# Patient Record
Sex: Male | Born: 1965 | Race: White | Hispanic: No | Marital: Married | Smoking: Former smoker
Health system: Southern US, Community
[De-identification: ages and names within clinical notes are randomized; demographics above are authoritative.]

## PROBLEM LIST (undated history)

## (undated) DIAGNOSIS — A539 Syphilis, unspecified: Secondary | ICD-10-CM

## (undated) DIAGNOSIS — M51369 Other intervertebral disc degeneration, lumbar region without mention of lumbar back pain or lower extremity pain: Secondary | ICD-10-CM

## (undated) DIAGNOSIS — K219 Gastro-esophageal reflux disease without esophagitis: Secondary | ICD-10-CM

## (undated) DIAGNOSIS — B1910 Unspecified viral hepatitis B without hepatic coma: Secondary | ICD-10-CM

## (undated) DIAGNOSIS — F32A Depression, unspecified: Secondary | ICD-10-CM

## (undated) DIAGNOSIS — F329 Major depressive disorder, single episode, unspecified: Secondary | ICD-10-CM

## (undated) DIAGNOSIS — M5136 Other intervertebral disc degeneration, lumbar region: Secondary | ICD-10-CM

## (undated) HISTORY — DX: Gastro-esophageal reflux disease without esophagitis: K21.9

## (undated) HISTORY — DX: Other intervertebral disc degeneration, lumbar region without mention of lumbar back pain or lower extremity pain: M51.369

## (undated) HISTORY — DX: Depression, unspecified: F32.A

## (undated) HISTORY — DX: Major depressive disorder, single episode, unspecified: F32.9

## (undated) HISTORY — DX: Other intervertebral disc degeneration, lumbar region: M51.36

---

## 2010-09-21 ENCOUNTER — Other Ambulatory Visit (INDEPENDENT_AMBULATORY_CARE_PROVIDER_SITE_OTHER): Payer: BC Managed Care – PPO | Admitting: Internal Medicine

## 2010-09-21 ENCOUNTER — Other Ambulatory Visit (INDEPENDENT_AMBULATORY_CARE_PROVIDER_SITE_OTHER): Payer: BC Managed Care – PPO

## 2010-09-21 ENCOUNTER — Ambulatory Visit (INDEPENDENT_AMBULATORY_CARE_PROVIDER_SITE_OTHER): Payer: BC Managed Care – PPO | Admitting: Internal Medicine

## 2010-09-21 ENCOUNTER — Encounter: Payer: Self-pay | Admitting: Internal Medicine

## 2010-09-21 VITALS — BP 118/72 | HR 54 | Temp 97.8°F | Ht 69.0 in | Wt 230.0 lb

## 2010-09-21 DIAGNOSIS — Z0000 Encounter for general adult medical examination without abnormal findings: Secondary | ICD-10-CM | POA: Insufficient documentation

## 2010-09-21 DIAGNOSIS — R5381 Other malaise: Secondary | ICD-10-CM

## 2010-09-21 DIAGNOSIS — R5383 Other fatigue: Secondary | ICD-10-CM

## 2010-09-21 DIAGNOSIS — F329 Major depressive disorder, single episode, unspecified: Secondary | ICD-10-CM

## 2010-09-21 DIAGNOSIS — Z136 Encounter for screening for cardiovascular disorders: Secondary | ICD-10-CM

## 2010-09-21 DIAGNOSIS — F32A Depression, unspecified: Secondary | ICD-10-CM | POA: Insufficient documentation

## 2010-09-21 DIAGNOSIS — E785 Hyperlipidemia, unspecified: Secondary | ICD-10-CM

## 2010-09-21 DIAGNOSIS — Z23 Encounter for immunization: Secondary | ICD-10-CM

## 2010-09-21 LAB — COMPREHENSIVE METABOLIC PANEL
Albumin: 4.1 g/dL (ref 3.5–5.2)
Alkaline Phosphatase: 90 U/L (ref 39–117)
BUN: 13 mg/dL (ref 6–23)
Calcium: 9.1 mg/dL (ref 8.4–10.5)
Chloride: 105 mEq/L (ref 96–112)
Creatinine, Ser: 1 mg/dL (ref 0.4–1.5)
Glucose, Bld: 84 mg/dL (ref 70–99)
Potassium: 4.1 mEq/L (ref 3.5–5.1)

## 2010-09-21 LAB — CBC WITH DIFFERENTIAL/PLATELET
Basophils Absolute: 0 10*3/uL (ref 0.0–0.1)
Eosinophils Absolute: 0.2 10*3/uL (ref 0.0–0.7)
Hemoglobin: 14.9 g/dL (ref 13.0–17.0)
Lymphocytes Relative: 34.8 % (ref 12.0–46.0)
MCHC: 35 g/dL (ref 30.0–36.0)
Monocytes Relative: 9 % (ref 3.0–12.0)
Neutro Abs: 3.7 10*3/uL (ref 1.4–7.7)
Platelets: 165 10*3/uL (ref 150.0–400.0)
RDW: 12.3 % (ref 11.5–14.6)

## 2010-09-21 LAB — TSH: TSH: 2.37 u[IU]/mL (ref 0.35–5.50)

## 2010-09-21 LAB — LIPID PANEL
Cholesterol: 170 mg/dL (ref 0–200)
HDL: 33.7 mg/dL — ABNORMAL LOW (ref >39.00)

## 2010-09-21 LAB — LDL CHOLESTEROL, DIRECT: Direct LDL: 97.3 mg/dL

## 2010-09-21 MED ORDER — BUPROPION HCL ER (XL) 150 MG PO TB24: 150.0000 mg | ORAL_TABLET | Freq: Every day | ORAL | Status: DC

## 2010-09-21 NOTE — Assessment & Plan Note (Signed)
Start bupropion - I think he will feel better with the support of DA and NE, will do labs today to look for secondary causes

## 2010-09-21 NOTE — Progress Notes (Signed)
Subjective:    Patient ID: Brandon Lane, male    DOB: 02/12/1966, 45 y.o.   MRN: [id number]  HPI New to me he complains of chronic depression that has worsened over the last year associated with the death of his mother in 2010/05/05. He has fatigue, weight gain, hypersomnolence, anhedonia, irritability, and sadness.  Also, he wants to do a complete physical today.    Review of Systems  Constitutional: Positive for fatigue and unexpected weight change (weight gain). Negative for fever, chills, diaphoresis, activity change and appetite change.  HENT: Negative for nosebleeds, congestion, facial swelling, rhinorrhea, trouble swallowing, neck pain, neck stiffness and postnasal drip.   Respiratory: Negative for apnea, cough, choking, chest tightness, shortness of breath, wheezing and stridor.   Cardiovascular: Negative for chest pain, palpitations and leg swelling.  Gastrointestinal: Negative for nausea, abdominal pain, diarrhea, constipation, blood in stool, abdominal distention, anal bleeding and rectal pain.  Genitourinary: Negative for dysuria, urgency, frequency, hematuria, flank pain, decreased urine volume, discharge, scrotal swelling, enuresis, difficulty urinating and testicular pain.  Musculoskeletal: Negative for back pain, joint swelling, arthralgias and gait problem.  Skin: Negative for color change, pallor, rash and wound (tattoes).  Neurological: Negative for dizziness, tremors, seizures, syncope, facial asymmetry, speech difficulty, weakness, light-headedness, numbness and headaches.  Hematological: Negative for adenopathy. Does not bruise/bleed easily.  Psychiatric/Behavioral: Positive for dysphoric mood. Negative for suicidal ideas, hallucinations, behavioral problems, confusion, self-injury, decreased concentration and agitation. The patient is not nervous/anxious and is not hyperactive.        Objective:   Physical Exam  Constitutional: He is oriented to person, place, and  time. He appears well-developed and well-nourished. No distress.  HENT:  Head: Normocephalic and atraumatic. No trismus in the jaw.  Right Ear: External ear normal.  Left Ear: External ear normal.  Nose: Nose normal.  Mouth/Throat: Uvula is midline and oropharynx is clear and moist. Mucous membranes are not pale, not dry and not cyanotic. He does not have dentures. No oral lesions. Abnormal dentition (missing teeth and poor dentition). Dental caries present. No dental abscesses, uvula swelling or lacerations. No oropharyngeal exudate, posterior oropharyngeal edema, posterior oropharyngeal erythema or tonsillar abscesses.  Eyes: Conjunctivae and EOM are normal. Pupils are equal, round, and reactive to light. Right eye exhibits no discharge. Left eye exhibits no discharge. No scleral icterus.  Neck: Normal range of motion. Neck supple. No JVD present. No tracheal deviation present. No thyromegaly present.  Cardiovascular: Normal rate, regular rhythm, normal heart sounds and intact distal pulses.  Exam reveals no gallop and no friction rub.   No murmur heard. Pulmonary/Chest: Effort normal and breath sounds normal. No respiratory distress. He has no wheezes. He has no rales. He exhibits no tenderness.  Abdominal: Soft. Bowel sounds are normal. He exhibits no distension and no mass. There is no tenderness. There is no rebound and no guarding. Hernia confirmed negative in the right inguinal area and confirmed negative in the left inguinal area.  Genitourinary: Testes normal and penis normal. Right testis shows no mass, no swelling and no tenderness. Left testis shows no mass, no swelling and no tenderness. Circumcised. No phimosis, hypospadias, penile erythema or penile tenderness. No discharge found.  Musculoskeletal: Normal range of motion. He exhibits no edema and no tenderness.  Lymphadenopathy:    He has no cervical adenopathy.       Right: No inguinal adenopathy present.       Left: No inguinal  adenopathy present.  Neurological: He is alert and  oriented to person, place, and time. He has normal reflexes. No cranial nerve deficit. Coordination normal.  Skin: Skin is warm and dry. No rash noted. He is not diaphoretic. No erythema.  Psychiatric: He has a normal mood and affect. His behavior is normal. Judgment and thought content normal.          Assessment & Plan:

## 2010-09-21 NOTE — Assessment & Plan Note (Addendum)
Routine labs ordered, his EKG is normal

## 2010-09-21 NOTE — Patient Instructions (Signed)
Health Maintenance in Males MAINTAIN REGULAR HEALTH EXAMS  Maintain a healthy diet and normal weight. Increased weight leads to problems with blood pressure and diabetes. Decrease fat in the diet and increase exercise. Obtain a proper diet from your caregiver if necessary.   High blood pressure causes heart and blood vessel problems. Check blood pressures regularly and keep your blood pressure at normal limits. Aerobic exercise helps this. Persistent elevations of blood pressure should be treated with medications if weight loss and exercise are ineffective.   Avoid smoking, drinking in excess (more than 2 drinks per day), or use of street drugs. Do not share needles with anyone. Ask for help if you need assistance or instructions on stopping the use of alcohol, cigarettes, or drugs.   Maintain normal blood lipids and cholesterol. Your caregiver can give you information to lower your risk of heart disease or stroke.   Ask your caregiver if you are in need of early heart disease screening because of a strong family history of heart disease or signs of elevated testosterone (male sex hormone) levels. These can predispose you to early heart disease.   Practice safe sex. Practicing safe sex decreases your risk for a sexually transmitted infection (STI). Some of the STIs are gonorrhea, chlamydia, syphilis, trichimonas, herpes, human papillomavirus (HPV), and human immunodeficiency virus (HIV). Herpes, HIV, and HPV are viral illnesses that have no cure. These can result in disability, cancer, and death.   It is not safe for someone who has AIDS or is HIV positive to have unprotected sex with a partner who is HIV positive. The reason for this is the fact that there are many different strains of HIV. If you have a strain that is readily treated with medications and then suddenly introduce a strain from a partner that has no further treatment options, you may suddenly have a strain of HIV that is untreatable.  Even if you are both positive for HIV, it is still necessary to practice safe sex.   Use sunscreen with a SPF of 15 or greater. Being outside in the sun when your shadow caused by the sun is shorter than you are, means you are being exposed to sun at greater intensity. Lighter skinned people are at a greater risk of skin cancer.   Keep carbon monoxide and smoke detectors in your home and functioning at all times. Change the batteries every 6 months.   Do monthly examinations of your testicles. The best time to do this is after a hot shower or bath when the tissues are loose. Notify your caregivers of any lumps, tenderness, or changes in size or shape.   Notify your caregiver of new moles or changes in moles, especially if there is a change in shape or color. Also notify your caregiver if a mole is larger than the size of a pencil eraser.   Stay current with your tetanus shots and other required immunizations.  The Body Mass Index (BMI) is a way of measuring how much of your body is fat. Having a BMI above 27 increases the risk of heart disease, diabetes, hypertension, stroke, and other problems related to obesity. Document Released: 11/18/2007 Document Re-Released: 11/09/2009 [facility] Patient Information 2011 [place], [place].Depression, Adolescent and Adult Depression is a true and treatable medical condition. In general there are two kinds of depression:  Depression we all experience in some form. For example depression from the death of a loved one, financial distress or natural disasters will trigger or increase depression.  Clinical depression, on the other hand, appears without an apparent cause or reason. This depression is a disease. Depression may be caused by chemical imbalance in the body and brain or may come as a response to a physical illness. Alcohol and other drugs can cause depression.  DIAGNOSIS  The diagnosis of depression is usually based upon symptoms and medical  history. TREATMENT Treatments for depression fall into three categories. These are:  Drug therapy. There are many medicines that treat depression. Responses may vary and sometimes trial and error is necessary to determine the best medicines and dosage for a particular patient.   Psychotherapy, also called talking treatments, helps people resolve their problems by looking at them from a different point of view and by giving people insight into their own personal makeup. Traditional psychotherapy looks at a childhood source of a problem. Other psychotherapy will look at current conflicts and move toward solving those. If the cause of depression is drug use, counseling is available to help abstain. In time the depression will usually improve. If there were underlying causes for the chemical use, they can be addressed.   ECT (electroconvulsive therapy) or shock treatment is not as commonly used today. It is a very effective treatment for severe suicidal depression. During ECT electrical impulses are applied to the head. These impulses cause a generalized seizure. It can be effective but causes a loss of memory for recent events. Sometimes this loss of memory may include the last several months.  Treat all depression or suicide threats as serious. Obtain professional help. Do not wait to see if serious depression will get better over time without help. Seek help for yourself or those around you. In the [place] the number to the National Suicide Help Lines With 24 Hour Help Are: 1-800-SUICIDE 919-593-6129 Document Released: 05/19/2000 Document Re-Released: 08/18/2008 [facility] Patient Information 2011 [place], [place].

## 2010-09-21 NOTE — Assessment & Plan Note (Signed)
Check labs for secondary causes

## 2010-09-21 NOTE — Assessment & Plan Note (Signed)
Start bupropion

## 2010-09-22 ENCOUNTER — Encounter: Payer: Self-pay | Admitting: Internal Medicine

## 2010-10-24 ENCOUNTER — Encounter: Payer: Self-pay | Admitting: Internal Medicine

## 2010-10-24 ENCOUNTER — Ambulatory Visit (INDEPENDENT_AMBULATORY_CARE_PROVIDER_SITE_OTHER): Payer: BC Managed Care – PPO | Admitting: Internal Medicine

## 2010-10-24 VITALS — BP 118/76 | HR 58 | Temp 98.7°F | Resp 16 | Wt 230.0 lb

## 2010-10-24 DIAGNOSIS — E781 Pure hyperglyceridemia: Secondary | ICD-10-CM | POA: Insufficient documentation

## 2010-10-24 DIAGNOSIS — F5221 Male erectile disorder: Secondary | ICD-10-CM | POA: Insufficient documentation

## 2010-10-24 DIAGNOSIS — F329 Major depressive disorder, single episode, unspecified: Secondary | ICD-10-CM

## 2010-10-24 DIAGNOSIS — F528 Other sexual dysfunction not due to a substance or known physiological condition: Secondary | ICD-10-CM

## 2010-10-24 MED ORDER — FENOFIBRATE 145 MG PO TABS: 145.0000 mg | ORAL_TABLET | Freq: Every day | ORAL | Status: DC

## 2010-10-24 MED ORDER — BUPROPION HCL ER (XL) 300 MG PO TB24: 300.0000 mg | ORAL_TABLET | Freq: Every day | ORAL | Status: DC

## 2010-10-24 MED ORDER — SILDENAFIL CITRATE 100 MG PO TABS: 100.0000 mg | ORAL_TABLET | ORAL | Status: DC | PRN

## 2010-10-24 NOTE — Assessment & Plan Note (Signed)
After a discussion he decided to start a med as he is not very optimistic about his ability to lower his trigs with diet, exercise, weight loss. So he was started on Tricor.

## 2010-10-24 NOTE — Progress Notes (Signed)
Subjective:    Patient ID: Brandon Lane, male    DOB: 25-Mar-1966, 45 y.o.   MRN: [id number]  Hyperlipidemia This is a new problem. The current episode started more than 1 month ago. The problem is uncontrolled. Recent lipid tests were reviewed and are high. Exacerbating diseases include obesity. He has no history of chronic renal disease, diabetes, hypothyroidism, liver disease or nephrotic syndrome. Factors aggravating his hyperlipidemia include fatty foods. Pertinent negatives include no chest pain, focal sensory loss, focal weakness, leg pain, myalgias or shortness of breath. He is currently on no antihyperlipidemic treatment. Compliance problems include adherence to diet and adherence to exercise.     He tells me that his mood is much better on wellbutrin and his irritability has diminished a lot, he would like to increase the dose if possible, he thinks the wellbutrin has affected his erectile function b/c he has had some loss of erections in the midst of sex. He has not had a decrease in his libido.   Review of Systems  Constitutional: Negative for fever, chills, diaphoresis, activity change, appetite change, fatigue and unexpected weight change.  Respiratory: Negative for apnea, cough, choking, chest tightness, shortness of breath, wheezing and stridor.   Cardiovascular: Negative for chest pain, palpitations and leg swelling.  Gastrointestinal: Negative for nausea, vomiting, abdominal pain, diarrhea, abdominal distention and anal bleeding.  Genitourinary: Negative for dysuria, urgency, frequency, hematuria, flank pain, decreased urine volume, scrotal swelling, enuresis, difficulty urinating and testicular pain.  Musculoskeletal: Negative for myalgias, back pain, joint swelling, arthralgias and gait problem.  Skin: Negative for color change, pallor and rash.  Neurological: Negative for dizziness, tremors, focal weakness, seizures, syncope, facial asymmetry, speech difficulty, weakness,  light-headedness, numbness and headaches.  Hematological: Negative for adenopathy. Does not bruise/bleed easily.  Psychiatric/Behavioral: Negative for suicidal ideas, hallucinations, behavioral problems, confusion, sleep disturbance, self-injury, dysphoric mood, decreased concentration and agitation. The patient is not nervous/anxious and is not hyperactive.        Objective:   Physical Exam  Vitals reviewed. Constitutional: He is oriented to person, place, and time. He appears well-developed and well-nourished. No distress.  HENT:  Head: Normocephalic and atraumatic.  Right Ear: External ear normal.  Left Ear: External ear normal.  Nose: Nose normal.  Mouth/Throat: No oropharyngeal exudate.  Eyes: Conjunctivae and EOM are normal. Pupils are equal, round, and reactive to light. Right eye exhibits no discharge. Left eye exhibits no discharge. No scleral icterus.  Neck: Normal range of motion. Neck supple. No JVD present. No tracheal deviation present. No thyromegaly present.  Cardiovascular: Normal rate, regular rhythm, normal heart sounds and intact distal pulses.  Exam reveals no gallop and no friction rub.   No murmur heard. Pulmonary/Chest: Effort normal and breath sounds normal. No stridor. No respiratory distress. He has no wheezes. He has no rales. He exhibits no tenderness.  Abdominal: Soft. Bowel sounds are normal. He exhibits no distension and no mass. There is no tenderness. There is no rebound and no guarding.  Genitourinary: Rectum normal, prostate normal and penis normal.  Musculoskeletal: Normal range of motion. He exhibits no edema and no tenderness.  Lymphadenopathy:    He has no cervical adenopathy.  Neurological: He is alert and oriented to person, place, and time. He has normal reflexes. No cranial nerve deficit. Coordination normal.  Skin: Skin is warm and dry. No rash noted. He is not diaphoretic. No erythema. No pallor.  Psychiatric: He has a normal mood and affect.  His behavior is normal. Judgment  and thought content normal.        Lab Results  Component Value Date   WBC 7.1 09/21/2010   HGB 14.9 09/21/2010   HCT 42.6 09/21/2010   PLT 165.0 09/21/2010   CHOL 170 09/21/2010   TRIG 292.0* 09/21/2010   HDL 33.70* 09/21/2010   LDLDIRECT 97.3 09/21/2010   ALT 25 09/21/2010   AST 27 09/21/2010   NA 141 09/21/2010   K 4.1 09/21/2010   CL 105 09/21/2010   CREATININE 1.0 09/21/2010   BUN 13 09/21/2010   CO2 29 09/21/2010   TSH 2.37 09/21/2010    Assessment & Plan:

## 2010-10-24 NOTE — Assessment & Plan Note (Signed)
Will increase his dose of wellbutrin

## 2010-10-24 NOTE — Patient Instructions (Signed)
Hypertriglyceridemia    Diet for High blood levels of Triglycerides  Most fats in food are triglycerides. Triglycerides in your blood are stored as fat in your body. High levels of triglycerides in your blood may put you at a greater risk for heart disease and stroke.    Normal triglyceride levels are less than 150 mg/dL. Borderline high levels are 150-199 mg/dl. High levels are 200 - 499 mg/dL, and very high triglyceride levels are greater than 500 mg/dL. The decision to treat high triglycerides is generally based on the level. For people with borderline or high triglyceride levels, treatment includes weight loss and exercise. Drugs are recommended for people with very high triglyceride levels.  Many people who need treatment for high triglyceride levels have metabolic syndrome. This syndrome is a collection of disorders that often include: insulin resistance, high blood pressure, blood clotting problems, high cholesterol and triglycerides.  TESTING PROCEDURE FOR TRIGLYCERIDES   You should not eat 4 hours before getting your triglycerides measured. The normal range of triglycerides is between 10 and 250 milligrams per deciliter (mg/dl). Some people may have extreme levels (1000 or above), but your triglyceride level may be too high if it is above 150 mg/dl, depending on what other risk factors you have for heart disease.    People with high blood triglycerides may also have high blood cholesterol levels. If you have high blood cholesterol as well as high blood triglycerides, your risk for heart disease is probably greater than if you only had high triglycerides. High blood cholesterol is one of the main risk factors for heart disease.   CHANGING YOUR DIET     Your weight can affect your blood triglyceride level. If you are more than 20% above your ideal body weight, you may be able to lower your blood triglycerides by losing weight. Eating less and exercising regularly is the best way to combat this. Fat provides more calories than any other food. The best way to lose weight is to eat less fat. Only 30% of your total calories should come from fat. Less than 7% of your diet should come from saturated fat. A diet low in fat and saturated fat is the same as a diet to decrease blood cholesterol. By eating a diet lower in fat, you may lose weight, lower your blood cholesterol, and lower your blood triglyceride level.    Eating a diet low in fat, especially saturated fat, may also help you lower your blood triglyceride level. Ask your dietitian to help you figure how much fat you can eat based on the number of calories your caregiver has prescribed for you.    Exercise, in addition to helping with weight loss may also help lower triglyceride levels.    Alcohol can increase blood triglycerides. You may need to stop drinking alcoholic beverages.    Too much carbohydrate in your diet may also increase your blood triglycerides. Some complex carbohydrates are necessary in your diet. These may include bread, rice, potatoes, other starchy vegetables and cereals.    Reduce "simple" carbohydrates. These may include pure sugars, candy, honey, and jelly without losing other nutrients. If you have the kind of high blood triglycerides that is affected by the amount of carbohydrates in your diet, you will need to eat less sugar and less high-sugar foods. Your caregiver can help you with this.    Adding 2-4 grams of fish oil (EPA+ DHA) may also help lower triglycerides. Speak with your caregiver before adding any supplements to   your regimen.   Following the Diet    Maintain your ideal weight. Your caregivers can help you with a diet. Generally, eating less food and getting more exercise will help you lose weight. Joining a weight control group may also help. Ask your caregivers for a good weight control group in your area.    Eat low-fat foods instead of high-fat foods. This can help you lose weight too.    These foods are lower in fat. Eat MORE of these:    Dried beans, peas, and lentils.      Egg whites.      Low-fat cottage cheese.      Fish.     Lean cuts of meat, such as round, sirloin, rump, and flank (cut extra fat off meat you fix).     Whole grain breads, cereals and pasta.      Skim and nonfat dry milk.      Low-fat yogurt.      Poultry without the skin.      Cheese made with skim or part-skim milk, such as mozzarella, parmesan, farmers', ricotta, or pot cheese.      These are higher fat foods. Eat LESS of these:    Whole milk and foods made from whole milk, such as American, blue, cheddar, monterey jack, and swiss cheese    High-fat meats, such as luncheon meats, sausages, knockwurst, bratwurst, hot dogs, ribs, corned beef, ground pork, and regular ground beef.    Fried foods.   Limit saturated fats in your diet. Substituting unsaturated fat for saturated fat may decrease your blood triglyceride level. You will need to read package labels to know which products contain saturated fats.    These foods are high in saturated fat. Eat LESS of these:    Fried pork skins.    Whole milk.      Skin and fat from poultry.      Palm oil.      Butter.     Shortening.     Cream cheese.      Bacon.     Margarines and baked goods made from listed oils.      Vegetable shortenings.     Chitterlings.    Fat from meats.      Coconut oil.      Palm kernel oil.      Lard.     Cream.     Sour cream.      Fatback.     Coffee whiteners and non-dairy creamers made with these oils.      Cheese made from whole milk.       Use unsaturated fats (both polyunsaturated and monounsaturated) moderately. Remember, even though unsaturated fats are better than saturated fats; you still want a diet low in total fat.    These foods are high in unsaturated fat:    Canola oil.    Sunflower oil.      Mayonnaise.     Almonds.     Peanuts.     Pine nuts.      Margarines made with these oils.     Safflower oil.    Olive oil.      Avocados.     Cashews.     Peanut butter.      Sunflower seeds.     Soybean oil.    Peanut oil.      Olives.     Pecans.     Walnuts.     Pumpkin seeds.        Avoid sugar and other high-sugar foods. This will decrease carbohydrates without decreasing other nutrients. Sugar in your food goes rapidly to your blood. When there is excess sugar in your blood, your liver may use it to make more triglycerides. Sugar also contains calories without other important nutrients.    Eat LESS of these:    Sugar, brown sugar, powdered sugar, jam, jelly, preserves, honey, syrup, molasses, pies, candy, cakes, cookies, frosting, pastries, colas, soft drinks, punches, fruit drinks, and regular gelatin.    Avoid alcohol. Alcohol, even more than sugar, may increase blood triglycerides. In addition, alcohol is high in calories and low in nutrients. Ask for sparkling water, or a diet soft drink instead of an alcoholic beverage.   Suggestions for planning and preparing meals    Bake, broil, grill or roast meats instead of frying.    Remove fat from meats and skin from poultry before cooking.    Add spices, herbs, lemon juice or vinegar to vegetables instead of salt, rich sauces or gravies.    Use a non-stick skillet without fat or use no-stick sprays.    Cool and refrigerate stews and broth. Then remove the hardened fat floating on the surface before serving.    Refrigerate meat drippings and skim off fat to make low-fat gravies.    Serve more fish.     Use less butter, margarine and other high-fat spreads on bread or vegetables.    Use skim or reconstituted non-fat dry milk for cooking.    Cook with low-fat cheeses.    Substitute low-fat yogurt or cottage cheese for all or part of the sour cream in recipes for sauces, dips or congealed salads.    Use half yogurt/half mayonnaise in salad recipes.    Substitute evaporated skim milk for cream. Evaporated skim milk or reconstituted non-fat dry milk can be whipped and substituted for whipped cream in certain recipes.    Choose fresh fruits for dessert instead of high-fat foods such as pies or cakes. Fruits are naturally low in fat.   When Dining Out    Order low-fat appetizers such as fruit or vegetable juice, pasta with vegetables or tomato sauce.    Select clear, rather than cream soups.    Ask that dressings and gravies be served on the side. Then use less of them.    Order foods that are baked, broiled, poached, steamed, stir-fried, or roasted.    Ask for margarine instead of butter, and use only a small amount.    Drink sparkling water, unsweetened tea or coffee, or diet soft drinks instead of alcohol or other sweet beverages.   QUESTIONS AND ANSWERS ABOUT OTHER FATS IN THE BLOOD:   SATURATED FAT, TRANS FAT, AND CHOLESTEROL  What is trans fat?  Trans fat is a type of fat that is formed when vegetable oil is hardened through a process called hydrogenation. This process helps makes foods more solid, gives them shape, and prolongs their shelf life. Trans fats are also called hydrogenated or partially hydrogenated oils.    What do saturated fat, trans fat, and cholesterol in foods have to do with heart disease?  Saturated fat, trans fat, and cholesterol in the diet all raise the level of LDL "bad" cholesterol in the blood. The higher the LDL cholesterol, the greater the risk for coronary heart disease (CHD). Saturated fat and trans fat raise LDL similarly.     What foods contain saturated fat, trans fat, and cholesterol?  High amounts of saturated fat   are found in animal products, such as fatty cuts of meat, chicken skin, and full-fat dairy products like butter, whole milk, cream, and cheese, and in tropical vegetable oils such as palm, palm kernel, and coconut oil. Trans fat is found in some of the same foods as saturated fat, such as vegetable shortening, some margarines (especially hard or stick margarine), crackers, cookies, baked goods, fried foods, salad dressings, and other processed foods made with partially hydrogenated vegetable oils. Small amounts of trans fat also occur naturally in some animal products, such as milk products, beef, and lamb. Foods high in cholesterol include liver, other organ meats, egg yolks, shrimp, and full-fat dairy products.  How can I use the new food label to make heart-healthy food choices?  Check the Nutrition Facts panel of the food label. Choose foods lower in saturated fat, trans fat, and cholesterol. For saturated fat and cholesterol, you can also use the Percent Daily Value (%DV): 5% DV or less is low, and 20% DV or more is high. (There is no %DV for trans fat.) Use the Nutrition Facts panel to choose foods low in saturated fat and cholesterol, and if the trans fat is not listed, read the ingredients and limit products that list shortening or hydrogenated or partially hydrogenated vegetable oil, which tend to be high in trans fat.  POINTS TO REMEMBER: YOU NEED A LITTLE TLC (THERAPEUTIC LIFESTYLE CHANGES)   Discuss your risk for heart disease with your caregivers, and take steps to reduce risk factors.    Change your diet. Choose foods that are low in saturated fat, trans fat, and cholesterol.     Add exercise to your daily routine if it is not already being done. Participate in physical activity of moderate intensity, like brisk walking, for at least 30 minutes on most, and preferably all days of the week. No time? Break the 30 minutes into three, 10-minute segments during the day.    Stop smoking. If you do smoke, contact your caregiver to discuss ways in which they can help you quit.    Do not use street drugs.    Maintain a normal weight.    Maintain a healthy blood pressure.    Keep up with your blood work for checking the fats in your blood as directed by your caregiver.   Document Released: 03/09/2004 Document Re-Released: 11/09/2009  [employer] Patient Information 2011 [employer].

## 2010-11-04 DIAGNOSIS — Z0000 Encounter for general adult medical examination without abnormal findings: Secondary | ICD-10-CM

## 2010-12-16 ENCOUNTER — Encounter: Payer: Self-pay | Admitting: Internal Medicine

## 2010-12-16 ENCOUNTER — Ambulatory Visit (INDEPENDENT_AMBULATORY_CARE_PROVIDER_SITE_OTHER): Payer: BC Managed Care – PPO | Admitting: Internal Medicine

## 2010-12-16 ENCOUNTER — Ambulatory Visit (INDEPENDENT_AMBULATORY_CARE_PROVIDER_SITE_OTHER)
Admission: RE | Admit: 2010-12-16 | Discharge: 2010-12-16 | Disposition: A | Discharge status: Home / Self Care | Payer: BC Managed Care – PPO | Source: Ambulatory Visit | Attending: Internal Medicine | Admitting: Internal Medicine

## 2010-12-16 DIAGNOSIS — F329 Major depressive disorder, single episode, unspecified: Secondary | ICD-10-CM

## 2010-12-16 DIAGNOSIS — F3289 Other specified depressive episodes: Secondary | ICD-10-CM

## 2010-12-16 DIAGNOSIS — M5137 Other intervertebral disc degeneration, lumbosacral region: Secondary | ICD-10-CM

## 2010-12-16 DIAGNOSIS — M545 Low back pain, unspecified: Secondary | ICD-10-CM

## 2010-12-16 DIAGNOSIS — M51379 Other intervertebral disc degeneration, lumbosacral region without mention of lumbar back pain or lower extremity pain: Secondary | ICD-10-CM

## 2010-12-16 DIAGNOSIS — M5136 Other intervertebral disc degeneration, lumbar region: Secondary | ICD-10-CM

## 2010-12-16 MED ORDER — DULOXETINE HCL 30 MG PO CPEP: 30.0000 mg | ORAL_CAPSULE | Freq: Every day | ORAL | Status: DC

## 2010-12-16 MED ORDER — TAPENTADOL HCL 50 MG PO TABS: 50.0000 mg | ORAL_TABLET | Freq: Four times a day (QID) | ORAL | Status: DC | PRN

## 2010-12-16 NOTE — Assessment & Plan Note (Signed)
I will change to cymbalta, this may help with his low back pain as well

## 2010-12-16 NOTE — Progress Notes (Signed)
Subjective:    Patient ID: Brandon Lane, male    DOB: [birth year], 45 y.o.   MRN: [id number]  Back Pain This is a chronic problem. The current episode started more than 1 year ago. The problem occurs intermittently. The problem has been gradually worsening since onset. The pain is present in the lumbar spine. The quality of the pain is described as aching. The pain does not radiate. The pain is at a severity of 5/10. The pain is moderate. The pain is worse during the day. The symptoms are aggravated by bending. Stiffness is present all day. Pertinent negatives include no abdominal pain, bladder incontinence, bowel incontinence, chest pain, dysuria, fever, headaches, leg pain, numbness, paresis, paresthesias, pelvic pain, perianal numbness, tingling, weakness or weight loss. Risk factors include poor posture and lack of exercise. He has tried NSAIDs and analgesics (he has been taking his wife's percocet) for the symptoms. The treatment provided moderate relief.      Review of Systems  Constitutional: Negative for fever, chills, weight loss, diaphoresis, activity change, appetite change, fatigue and unexpected weight change.  Eyes: Negative.   Respiratory: Negative for apnea, cough, choking, chest tightness, shortness of breath, wheezing and stridor.   Cardiovascular: Negative for chest pain, palpitations and leg swelling.  Gastrointestinal: Negative for nausea, vomiting, abdominal pain, diarrhea, constipation, blood in stool and bowel incontinence.  Genitourinary: Negative.  Negative for bladder incontinence, dysuria and pelvic pain.  Musculoskeletal: Positive for back pain. Negative for myalgias, joint swelling, arthralgias and gait problem.  Skin: Negative for color change, pallor, rash and wound.  Neurological: Negative for dizziness, tingling, tremors, seizures, syncope, facial asymmetry, speech difficulty, weakness, light-headedness, numbness, headaches and paresthesias.    Psychiatric/Behavioral: Positive for behavioral problems (he still feels irritable and wants to try a different antidepressant) and dysphoric mood. Negative for suicidal ideas, hallucinations, confusion, sleep disturbance, self-injury, decreased concentration and agitation. The patient is not nervous/anxious and is not hyperactive.        Objective:   Physical Exam  Vitals reviewed. Constitutional: He is oriented to person, place, and time. He appears well-developed and well-nourished. No distress.  HENT:  Head: Normocephalic and atraumatic.  Right Ear: External ear normal.  Left Ear: External ear normal.  Nose: Nose normal.  Mouth/Throat: Oropharynx is clear and moist. No oropharyngeal exudate.  Eyes: Conjunctivae and EOM are normal. Pupils are equal, round, and reactive to light. Right eye exhibits no discharge. Left eye exhibits no discharge. No scleral icterus.  Neck: Normal range of motion. Neck supple. No JVD present. No tracheal deviation present. No thyromegaly present.  Cardiovascular: Normal rate, regular rhythm, normal heart sounds and intact distal pulses.  Exam reveals no gallop and no friction rub.   No murmur heard. Pulmonary/Chest: Effort normal and breath sounds normal. No stridor. No respiratory distress. He has no wheezes. He has no rales. He exhibits no tenderness.  Abdominal: Soft. Bowel sounds are normal. He exhibits no distension and no mass. There is no tenderness. There is no rebound and no guarding.  Musculoskeletal: Normal range of motion. He exhibits no edema and no tenderness.       Lumbar back: Normal. He exhibits normal range of motion, no tenderness, no bony tenderness, no swelling, no edema, no deformity, no laceration, no pain, no spasm and normal pulse.  Lymphadenopathy:    He has no cervical adenopathy.  Neurological: He is alert and oriented to person, place, and time. He has normal strength. He displays no atrophy and no tremor. No  cranial nerve deficit  or sensory deficit. He exhibits normal muscle tone. He displays a negative Romberg sign. He displays no seizure activity. Coordination and gait normal.  Reflex Scores:      Tricep reflexes are 1+ on the right side and 1+ on the left side.      Bicep reflexes are 1+ on the right side and 1+ on the left side.      Brachioradialis reflexes are 1+ on the right side and 1+ on the left side.      Patellar reflexes are 2+ on the right side and 2+ on the left side.      Achilles reflexes are 2+ on the right side and 2+ on the left side.      - SLR in both legs  Skin: Skin is warm and dry. No rash noted. He is not diaphoretic. No erythema. No pallor.  Psychiatric: He has a normal mood and affect. His behavior is normal. Judgment and thought content normal.          Assessment & Plan:

## 2010-12-16 NOTE — Patient Instructions (Signed)
Back Pain (Lumbosacral Strain) Back pain is one of the most common causes of pain. There are many causes of back pain. Most are not serious conditions.  CAUSES Your backbone (spinal column) is made up of 24 main vertebral bodies, the sacrum, and the coccyx. These are held together by muscles and tough, fibrous tissue (ligaments). Nerve roots pass through the openings between the vertebrae. A sudden move or injury to the back may cause injury to, or pressure on, these nerves. This may result in localized back pain or pain movement (radiation) into the buttocks, down the leg, and into the foot. Sharp, shooting pain from the buttock down the back of the leg (sciatica) is frequently associated with a ruptured (herniated) disc. Pain may be caused by muscle spasm alone. Your caregiver can often find the cause of your pain by the details of your symptoms and an exam. In some cases, you may need tests (such as X-rays). Your caregiver will work with you to decide if any tests are needed based on your specific exam. HOME CARE INSTRUCTIONS  Avoid an underactive lifestyle. Active exercise, as directed by your caregiver, is your greatest weapon against back pain.   Avoid hard physical activities (tennis, racquetball, water-skiing) if you are not in proper physical condition for it. This may aggravate and/or create problems.   If you have a back problem, avoid sports requiring sudden body movements. Swimming and walking are generally safer activities.   Maintain good posture.   Avoid becoming overweight (obese).   Use bed rest for only the most extreme, sudden (acute) episode. Your caregiver will help you determine how much bed rest is necessary.   For acute conditions, you may put ice on the injured area.   Put ice in a plastic bag.   Place a towel between your skin and the bag.   Leave the ice on for 20 minutes at a time, every 2 hours, or as needed.   After you are improved and more active, it may  help to apply heat for 30 minutes before activities.  See your caregiver if you are having pain that lasts longer than expected. Your caregiver can advise appropriate exercises and/or therapy if needed. With conditioning, most back problems can be avoided. SEEK IMMEDIATE MEDICAL CARE IF:  You have numbness, tingling, weakness, or problems with the use of your arms or legs.   You experience severe back pain not relieved with medicines.   There is a change in bowel or bladder control.   You have increasing pain in any area of the body, including your belly (abdomen).   You notice shortness of breath, dizziness, or feel faint.   You feel sick to your stomach (nauseous), are throwing up (vomiting), or become sweaty.   You notice discoloration of your toes or legs, or your feet get very cold.   Your back pain is getting worse.  You have an oral temperature above 100.5Depression, Adolescent and Adult Depression is a true and treatable medical condition. In general there are two kinds of depression: Depression we all experience in some form. For example depression from the death of a loved one, financial distress or natural disasters will trigger or increase depression.  Clinical depression, on the other hand, appears without an apparent cause or reason. This depression is a disease. Depression may be caused by chemical imbalance in the body and brain or may come as a response to a physical illness. Alcohol and other drugs can cause depression.  DIAGNOSIS  The diagnosis of depression is usually based upon symptoms and medical history. TREATMENT Treatments for depression fall into three categories. These are: Drug therapy. There are many medicines that treat depression. Responses may vary and sometimes trial and error is necessary to determine the best medicines and dosage for a particular patient.  Psychotherapy, also called talking treatments, helps people resolve their problems by looking at  them from a different point of view and by giving people insight into their own personal makeup. Traditional psychotherapy looks at a childhood source of a problem. Other psychotherapy will look at current conflicts and move toward solving those. If the cause of depression is drug use, counseling is available to help abstain. In time the depression will usually improve. If there were underlying causes for the chemical use, they can be addressed.  ECT (electroconvulsive therapy) or shock treatment is not as commonly used today. It is a very effective treatment for severe suicidal depression. During ECT electrical impulses are applied to the head. These impulses cause a generalized seizure. It can be effective but causes a loss of memory for recent events. Sometimes this loss of memory may include the last several months.  Treat all depression or suicide threats as serious. Obtain professional help. Do not wait to see if serious depression will get better over time without help. Seek help for yourself or those around you. In the [place] the number to the National Suicide Help Lines With 24 Hour Help Are: 1-800-SUICIDE 364 522 8301 Document Released: 05/19/2000 Document Re-Released: 08/18/2008  [facility] Patient Information 2011 [place], [place]., not controlled by medicine.  MAKE SURE YOU:   Understand these instructions.   Will watch your condition.   Will get help right away if you are not doing well or get worse.  Document Released: 03/01/2005 Document Re-Released: 08/16/2009 [facility] Patient Information 2011 [place], [place].

## 2010-12-16 NOTE — Assessment & Plan Note (Signed)
I will check plain films to look for structural lesion and try nucynta for pain

## 2010-12-19 ENCOUNTER — Telehealth: Payer: Self-pay

## 2010-12-19 NOTE — Telephone Encounter (Signed)
Requesting X-ray results from Friday  December 16, 2010

## 2010-12-19 NOTE — Telephone Encounter (Signed)
Degen disc disease

## 2010-12-19 NOTE — Telephone Encounter (Signed)
Pt.notified

## 2011-01-13 ENCOUNTER — Ambulatory Visit (INDEPENDENT_AMBULATORY_CARE_PROVIDER_SITE_OTHER): Payer: BC Managed Care – PPO | Admitting: Internal Medicine

## 2011-01-13 ENCOUNTER — Encounter: Payer: Self-pay | Admitting: Internal Medicine

## 2011-01-13 DIAGNOSIS — E781 Pure hyperglyceridemia: Secondary | ICD-10-CM

## 2011-01-13 DIAGNOSIS — M5136 Other intervertebral disc degeneration, lumbar region: Secondary | ICD-10-CM

## 2011-01-13 DIAGNOSIS — F528 Other sexual dysfunction not due to a substance or known physiological condition: Secondary | ICD-10-CM

## 2011-01-13 DIAGNOSIS — M545 Low back pain: Secondary | ICD-10-CM

## 2011-01-13 DIAGNOSIS — M5137 Other intervertebral disc degeneration, lumbosacral region: Secondary | ICD-10-CM

## 2011-01-13 DIAGNOSIS — F329 Major depressive disorder, single episode, unspecified: Secondary | ICD-10-CM

## 2011-01-13 DIAGNOSIS — F5221 Male erectile disorder: Secondary | ICD-10-CM

## 2011-01-13 MED ORDER — DULOXETINE HCL 30 MG PO CPEP: 30.0000 mg | ORAL_CAPSULE | Freq: Every day | ORAL | Status: DC

## 2011-01-13 MED ORDER — SILDENAFIL CITRATE 100 MG PO TABS: 100.0000 mg | ORAL_TABLET | ORAL | Status: DC | PRN

## 2011-01-13 MED ORDER — TAPENTADOL HCL 50 MG PO TABS: 50.0000 mg | ORAL_TABLET | Freq: Four times a day (QID) | ORAL | Status: DC | PRN

## 2011-01-13 NOTE — Assessment & Plan Note (Signed)
Continue cymbalta and nucynta

## 2011-01-13 NOTE — Assessment & Plan Note (Signed)
Continue viagra prn.  

## 2011-01-13 NOTE — Progress Notes (Signed)
Subjective:    Patient ID: Brandon Lane, male    DOB: 1965-09-26, 45 y.o.   MRN: [id number]  Erectile Dysfunction This is a chronic problem. The current episode started more than 1 year ago. The problem has been gradually improving since onset. The nature of his difficulty is achieving erection, maintaining erection and penetration. He reports no anxiety, decreased libido or performance anxiety. He reports his erection duration to be 1 to 5 minutes. Irritative symptoms do not include frequency, nocturia or urgency. Obstructive symptoms do not include dribbling, incomplete emptying, an intermittent stream, a slower stream, straining or a weak stream. Pertinent negatives include no chills, dysuria, genital pain, hematuria, hesitancy or inability to urinate. The symptoms are aggravated by nothing. Past treatments include sildenafil. The treatment provided significant relief. He has had no adverse reactions caused by medications.  Back Pain This is a chronic problem. The current episode started more than 1 year ago. The problem occurs intermittently. The problem has been gradually improving since onset. The pain is present in the lumbar spine. The quality of the pain is described as aching. The pain does not radiate. The pain is at a severity of 6/10. The pain is mild. The pain is worse during the day. The symptoms are aggravated by bending. Stiffness is present all day. Pertinent negatives include no abdominal pain, bladder incontinence, bowel incontinence, chest pain, dysuria, fever, headaches, leg pain, numbness, paresis, paresthesias, pelvic pain, perianal numbness, tingling, weakness or weight loss. He has tried analgesics for the symptoms. The treatment provided significant relief.      Review of Systems  Constitutional: Negative for fever, chills, weight loss, diaphoresis, activity change, appetite change, fatigue and unexpected weight change.  HENT: Negative.   Eyes: Negative.   Respiratory:  Negative.   Cardiovascular: Negative for chest pain, palpitations and leg swelling.  Gastrointestinal: Negative.  Negative for abdominal pain and bowel incontinence.  Genitourinary: Negative.  Negative for bladder incontinence, dysuria, hesitancy, urgency, frequency, hematuria, pelvic pain, decreased libido, incomplete emptying and nocturia.  Musculoskeletal: Positive for back pain. Negative for myalgias, joint swelling, arthralgias and gait problem.  Skin: Negative for color change, pallor, rash and wound.  Neurological: Negative for dizziness, tingling, tremors, seizures, syncope, facial asymmetry, speech difficulty, weakness, light-headedness, numbness, headaches and paresthesias.  Hematological: Negative for adenopathy. Does not bruise/bleed easily.  Psychiatric/Behavioral: Negative for hallucinations, behavioral problems, confusion, self-injury, dysphoric mood, decreased concentration and agitation. The patient is not nervous/anxious and is not hyperactive.        Objective:   Physical Exam  Vitals reviewed. Constitutional: He is oriented to person, place, and time. He appears well-developed and well-nourished. No distress.  HENT:  Head: Normocephalic and atraumatic.  Right Ear: External ear normal.  Left Ear: External ear normal.  Nose: Nose normal.  Mouth/Throat: No oropharyngeal exudate.  Eyes: Conjunctivae and EOM are normal. Pupils are equal, round, and reactive to light. Right eye exhibits no discharge. Left eye exhibits no discharge. No scleral icterus.  Neck: Normal range of motion. Neck supple. No JVD present. No tracheal deviation present. No thyromegaly present.  Cardiovascular: Normal rate, regular rhythm, normal heart sounds and intact distal pulses.  Exam reveals no gallop and no friction rub.   No murmur heard. Pulmonary/Chest: Effort normal and breath sounds normal. No stridor. No respiratory distress. He has no wheezes. He has no rales. He exhibits no tenderness.    Abdominal: Soft. Bowel sounds are normal. He exhibits no distension and no mass. There is no tenderness. There  is no rebound and no guarding.  Musculoskeletal: Normal range of motion. He exhibits no edema and no tenderness.       Lumbar back: Normal. He exhibits normal range of motion, no tenderness, no bony tenderness, no swelling, no edema and no deformity.  Lymphadenopathy:    He has no cervical adenopathy.  Neurological: He is alert and oriented to person, place, and time. He has normal reflexes. He displays normal reflexes. No cranial nerve deficit. He exhibits normal muscle tone. Coordination normal.  Skin: Skin is warm and dry. No rash noted. He is not diaphoretic. No erythema. No pallor.  Psychiatric: He has a normal mood and affect. His behavior is normal. Judgment and thought content normal.          Assessment & Plan:

## 2011-01-13 NOTE — Assessment & Plan Note (Signed)
He is not fasting today therefore his FLP was not done

## 2011-01-13 NOTE — Assessment & Plan Note (Signed)
He is doing well on cymbalta and this will help manage his pain as well

## 2011-01-13 NOTE — Assessment & Plan Note (Signed)
He is getting significant pain relief with nucynta so will continue it

## 2011-01-13 NOTE — Patient Instructions (Signed)
Back Pain (Lumbosacral Strain) Back pain is one of the most common causes of pain. There are many causes of back pain. Most are not serious conditions.  CAUSES Your backbone (spinal column) is made up of 24 main vertebral bodies, the sacrum, and the coccyx. These are held together by muscles and tough, fibrous tissue (ligaments). Nerve roots pass through the openings between the vertebrae. A sudden move or injury to the back may cause injury to, or pressure on, these nerves. This may result in localized back pain or pain movement (radiation) into the buttocks, down the leg, and into the foot. Sharp, shooting pain from the buttock down the back of the leg (sciatica) is frequently associated with a ruptured (herniated) disc. Pain may be caused by muscle spasm alone. Your caregiver can often find the cause of your pain by the details of your symptoms and an exam. In some cases, you may need tests (such as X-rays). Your caregiver will work with you to decide if any tests are needed based on your specific exam. HOME CARE INSTRUCTIONS  Avoid an underactive lifestyle. Active exercise, as directed by your caregiver, is your greatest weapon against back pain.   Avoid hard physical activities (tennis, racquetball, water-skiing) if you are not in proper physical condition for it. This may aggravate and/or create problems.   If you have a back problem, avoid sports requiring sudden body movements. Swimming and walking are generally safer activities.   Maintain good posture.   Avoid becoming overweight (obese).   Use bed rest for only the most extreme, sudden (acute) episode. Your caregiver will help you determine how much bed rest is necessary.   For acute conditions, you may put ice on the injured area.   Put ice in a plastic bag.   Place a towel between your skin and the bag.   Leave the ice on for 20 minutes at a time, every 2 hours, or as needed.   After you are improved and more active, it may  help to apply heat for 30 minutes before activities.  See your caregiver if you are having pain that lasts longer than expected. Your caregiver can advise appropriate exercises and/or therapy if needed. With conditioning, most back problems can be avoided. SEEK IMMEDIATE MEDICAL CARE IF:  You have numbness, tingling, weakness, or problems with the use of your arms or legs.   You experience severe back pain not relieved with medicines.   There is a change in bowel or bladder control.   You have increasing pain in any area of the body, including your belly (abdomen).   You notice shortness of breath, dizziness, or feel faint.   You feel sick to your stomach (nauseous), are throwing up (vomiting), or become sweaty.   You notice discoloration of your toes or legs, or your feet get very cold.   Your back pain is getting worse.   You have an oral temperature above 100.5, not controlled by medicine.  MAKE SURE YOU:   Understand these instructions.   Will watch your condition.   Will get help right away if you are not doing well or get worse.  Document Released: 03/01/2005 Document Re-Released: 08/16/2009 [employer] Patient Information 2011 [employer]. 

## 2011-01-23 ENCOUNTER — Ambulatory Visit: Payer: BC Managed Care – PPO | Admitting: Internal Medicine

## 2011-01-24 ENCOUNTER — Ambulatory Visit: Payer: BC Managed Care – PPO | Admitting: Internal Medicine

## 2011-04-21 ENCOUNTER — Ambulatory Visit: Payer: BC Managed Care – PPO | Admitting: Internal Medicine

## 2011-05-03 ENCOUNTER — Other Ambulatory Visit (INDEPENDENT_AMBULATORY_CARE_PROVIDER_SITE_OTHER): Payer: BC Managed Care – PPO

## 2011-05-03 ENCOUNTER — Ambulatory Visit (INDEPENDENT_AMBULATORY_CARE_PROVIDER_SITE_OTHER): Payer: BC Managed Care – PPO | Admitting: Internal Medicine

## 2011-05-03 ENCOUNTER — Encounter: Payer: Self-pay | Admitting: Internal Medicine

## 2011-05-03 VITALS — BP 124/82 | HR 61 | Temp 97.4°F | Resp 16 | Wt 224.0 lb

## 2011-05-03 DIAGNOSIS — M5137 Other intervertebral disc degeneration, lumbosacral region: Secondary | ICD-10-CM

## 2011-05-03 DIAGNOSIS — E781 Pure hyperglyceridemia: Secondary | ICD-10-CM

## 2011-05-03 DIAGNOSIS — M5136 Other intervertebral disc degeneration, lumbar region: Secondary | ICD-10-CM

## 2011-05-03 DIAGNOSIS — F329 Major depressive disorder, single episode, unspecified: Secondary | ICD-10-CM

## 2011-05-03 DIAGNOSIS — M545 Low back pain: Secondary | ICD-10-CM

## 2011-05-03 LAB — COMPREHENSIVE METABOLIC PANEL
AST: 26 U/L (ref 0–37)
Alkaline Phosphatase: 92 U/L (ref 39–117)
BUN: 15 mg/dL (ref 6–23)
Creatinine, Ser: 1.1 mg/dL (ref 0.4–1.5)
Total Bilirubin: 1.2 mg/dL (ref 0.3–1.2)

## 2011-05-03 LAB — LIPID PANEL
Cholesterol: 172 mg/dL (ref 0–200)
HDL: 47.7 mg/dL (ref >39.00)
LDL Cholesterol: 98 mg/dL (ref 0–99)
Triglycerides: 131 mg/dL (ref 0.0–149.0)
VLDL: 26.2 mg/dL (ref 0.0–40.0)

## 2011-05-03 MED ORDER — OXYCODONE-ACETAMINOPHEN 7.5-500 MG PO TABS: 1.0000 | ORAL_TABLET | Freq: Four times a day (QID) | ORAL | Status: DC | PRN

## 2011-05-03 NOTE — Assessment & Plan Note (Signed)
He is tolerating the tricor well, today I will check his FLP and CMP

## 2011-05-03 NOTE — Assessment & Plan Note (Signed)
He is doing much better so will stay on cymbalta

## 2011-05-03 NOTE — Patient Instructions (Signed)
Hypertriglyceridemia  Diet for High blood levels of Triglycerides Most fats in food are triglycerides. Triglycerides in your blood are stored as fat in your body. High levels of triglycerides in your blood may put you at a greater risk for heart disease and stroke.  Normal triglyceride levels are less than 150 mg/dL. Borderline high levels are 150-199 mg/dl. High levels are 200 - 499 mg/dL, and very high triglyceride levels are greater than 500 mg/dL. The decision to treat high triglycerides is generally based on the level. For people with borderline or high triglyceride levels, treatment includes weight loss and exercise. Drugs are recommended for people with very high triglyceride levels. Many people who need treatment for high triglyceride levels have metabolic syndrome. This syndrome is a collection of disorders that often include: insulin resistance, high blood pressure, blood clotting problems, high cholesterol and triglycerides. TESTING PROCEDURE FOR TRIGLYCERIDES  You should not eat 4 hours before getting your triglycerides measured. The normal range of triglycerides is between 10 and 250 milligrams per deciliter (mg/dl). Some people may have extreme levels (1000 or above), but your triglyceride level may be too high if it is above 150 mg/dl, depending on what other risk factors you have for heart disease.   People with high blood triglycerides may also have high blood cholesterol levels. If you have high blood cholesterol as well as high blood triglycerides, your risk for heart disease is probably greater than if you only had high triglycerides. High blood cholesterol is one of the main risk factors for heart disease.  CHANGING YOUR DIET  Your weight can affect your blood triglyceride level. If you are more than 20% above your ideal body weight, you may be able to lower your blood triglycerides by losing weight. Eating less and exercising regularly is the best way to combat this. Fat provides  more calories than any other food. The best way to lose weight is to eat less fat. Only 30% of your total calories should come from fat. Less than 7% of your diet should come from saturated fat. A diet low in fat and saturated fat is the same as a diet to decrease blood cholesterol. By eating a diet lower in fat, you may lose weight, lower your blood cholesterol, and lower your blood triglyceride level.  Eating a diet low in fat, especially saturated fat, may also help you lower your blood triglyceride level. Ask your dietitian to help you figure how much fat you can eat based on the number of calories your caregiver has prescribed for you.  Exercise, in addition to helping with weight loss may also help lower triglyceride levels.   Alcohol can increase blood triglycerides. You may need to stop drinking alcoholic beverages.   Too much carbohydrate in your diet may also increase your blood triglycerides. Some complex carbohydrates are necessary in your diet. These may include bread, rice, potatoes, other starchy vegetables and cereals.   Reduce "simple" carbohydrates. These may include pure sugars, candy, honey, and jelly without losing other nutrients. If you have the kind of high blood triglycerides that is affected by the amount of carbohydrates in your diet, you will need to eat less sugar and less high-sugar foods. Your caregiver can help you with this.   Adding 2-4 grams of fish oil (EPA+ DHA) may also help lower triglycerides. Speak with your caregiver before adding any supplements to your regimen.  Following the Diet  Maintain your ideal weight. Your caregivers can help you with a diet. Generally,   eating less food and getting more exercise will help you lose weight. Joining a weight control group may also help. Ask your caregivers for a good weight control group in your area.  Eat low-fat foods instead of high-fat foods. This can help you lose weight too.  These foods are lower in fat. Eat MORE  of these:   Dried beans, peas, and lentils.   Egg whites.   Low-fat cottage cheese.   Fish.   Lean cuts of meat, such as round, sirloin, rump, and flank (cut extra fat off meat you fix).   Whole grain breads, cereals and pasta.   Skim and nonfat dry milk.   Low-fat yogurt.   Poultry without the skin.   Cheese made with skim or part-skim milk, such as mozzarella, parmesan, farmers', ricotta, or pot cheese.  These are higher fat foods. Eat LESS of these:   Whole milk and foods made from whole milk, such as American, blue, cheddar, monterey jack, and swiss cheese   High-fat meats, such as luncheon meats, sausages, knockwurst, bratwurst, hot dogs, ribs, corned beef, ground pork, and regular ground beef.   Fried foods.  Limit saturated fats in your diet. Substituting unsaturated fat for saturated fat may decrease your blood triglyceride level. You will need to read package labels to know which products contain saturated fats.  These foods are high in saturated fat. Eat LESS of these:   Fried pork skins.   Whole milk.   Skin and fat from poultry.   Palm oil.   Butter.   Shortening.   Cream cheese.   Bacon.   Margarines and baked goods made from listed oils.   Vegetable shortenings.   Chitterlings.   Fat from meats.   Coconut oil.   Palm kernel oil.   Lard.   Cream.   Sour cream.   Fatback.   Coffee whiteners and non-dairy creamers made with these oils.   Cheese made from whole milk.  Use unsaturated fats (both polyunsaturated and monounsaturated) moderately. Remember, even though unsaturated fats are better than saturated fats; you still want a diet low in total fat.  These foods are high in unsaturated fat:   Canola oil.   Sunflower oil.   Mayonnaise.   Almonds.   Peanuts.   Pine nuts.   Margarines made with these oils.   Safflower oil.   Olive oil.   Avocados.   Cashews.   Peanut butter.   Sunflower seeds.   Soybean oil.     Peanut oil.   Olives.   Pecans.   Walnuts.   Pumpkin seeds.  Avoid sugar and other high-sugar foods. This will decrease carbohydrates without decreasing other nutrients. Sugar in your food goes rapidly to your blood. When there is excess sugar in your blood, your liver may use it to make more triglycerides. Sugar also contains calories without other important nutrients.  Eat LESS of these:   Sugar, brown sugar, powdered sugar, jam, jelly, preserves, honey, syrup, molasses, pies, candy, cakes, cookies, frosting, pastries, colas, soft drinks, punches, fruit drinks, and regular gelatin.   Avoid alcohol. Alcohol, even more than sugar, may increase blood triglycerides. In addition, alcohol is high in calories and low in nutrients. Ask for sparkling water, or a diet soft drink instead of an alcoholic beverage.  Suggestions for planning and preparing meals   Bake, broil, grill or roast meats instead of frying.   Remove fat from meats and skin from poultry before cooking.   Add spices,   herbs, lemon juice or vinegar to vegetables instead of salt, rich sauces or gravies.   Use a non-stick skillet without fat or use no-stick sprays.   Cool and refrigerate stews and broth. Then remove the hardened fat floating on the surface before serving.   Refrigerate meat drippings and skim off fat to make low-fat gravies.   Serve more fish.   Use less butter, margarine and other high-fat spreads on bread or vegetables.   Use skim or reconstituted non-fat dry milk for cooking.   Cook with low-fat cheeses.   Substitute low-fat yogurt or cottage cheese for all or part of the sour cream in recipes for sauces, dips or congealed salads.   Use half yogurt/half mayonnaise in salad recipes.   Substitute evaporated skim milk for cream. Evaporated skim milk or reconstituted non-fat dry milk can be whipped and substituted for whipped cream in certain recipes.   Choose fresh fruits for dessert instead of  high-fat foods such as pies or cakes. Fruits are naturally low in fat.  When Dining Out   Order low-fat appetizers such as fruit or vegetable juice, pasta with vegetables or tomato sauce.   Select clear, rather than cream soups.   Ask that dressings and gravies be served on the side. Then use less of them.   Order foods that are baked, broiled, poached, steamed, stir-fried, or roasted.   Ask for margarine instead of butter, and use only a small amount.   Drink sparkling water, unsweetened tea or coffee, or diet soft drinks instead of alcohol or other sweet beverages.  QUESTIONS AND ANSWERS ABOUT OTHER FATS IN THE BLOOD: SATURATED FAT, TRANS FAT, AND CHOLESTEROL What is trans fat? Trans fat is a type of fat that is formed when vegetable oil is hardened through a process called hydrogenation. This process helps makes foods more solid, gives them shape, and prolongs their shelf life. Trans fats are also called hydrogenated or partially hydrogenated oils.  What do saturated fat, trans fat, and cholesterol in foods have to do with heart disease? Saturated fat, trans fat, and cholesterol in the diet all raise the level of LDL "bad" cholesterol in the blood. The higher the LDL cholesterol, the greater the risk for coronary heart disease (CHD). Saturated fat and trans fat raise LDL similarly.  What foods contain saturated fat, trans fat, and cholesterol? High amounts of saturated fat are found in animal products, such as fatty cuts of meat, chicken skin, and full-fat dairy products like butter, whole milk, cream, and cheese, and in tropical vegetable oils such as palm, palm kernel, and coconut oil. Trans fat is found in some of the same foods as saturated fat, such as vegetable shortening, some margarines (especially hard or stick margarine), crackers, cookies, baked goods, fried foods, salad dressings, and other processed foods made with partially hydrogenated vegetable oils. Small amounts of trans fat  also occur naturally in some animal products, such as milk products, beef, and lamb. Foods high in cholesterol include liver, other organ meats, egg yolks, shrimp, and full-fat dairy products. How can I use the new food label to make heart-healthy food choices? Check the Nutrition Facts panel of the food label. Choose foods lower in saturated fat, trans fat, and cholesterol. For saturated fat and cholesterol, you can also use the Percent Daily Value (%DV): 5% DV or less is low, and 20% DV or more is high. (There is no %DV for trans fat.) Use the Nutrition Facts panel to choose foods low in   saturated fat and cholesterol, and if the trans fat is not listed, read the ingredients and limit products that list shortening or hydrogenated or partially hydrogenated vegetable oil, which tend to be high in trans fat. POINTS TO REMEMBER: YOU NEED A LITTLE TLC (THERAPEUTIC LIFESTYLE CHANGES)  Discuss your risk for heart disease with your caregivers, and take steps to reduce risk factors.   Change your diet. Choose foods that are low in saturated fat, trans fat, and cholesterol.   Add exercise to your daily routine if it is not already being done. Participate in physical activity of moderate intensity, like brisk walking, for at least 30 minutes on most, and preferably all days of the week. No time? Break the 30 minutes into three, 10-minute segments during the day.   Stop smoking. If you do smoke, contact your caregiver to discuss ways in which they can help you quit.   Do not use street drugs.   Maintain a normal weight.   Maintain a healthy blood pressure.   Keep up with your blood work for checking the fats in your blood as directed by your caregiver.  Document Released: 03/09/2004 Document Revised: 02/01/2011 Document Reviewed: 10/05/2008 [facility] Patient Information 2012 [place], [place].Back Pain, Adult Low back pain is very common. About 1 in 5 people have back pain.The cause of low back pain is  rarely dangerous. The pain often gets better over time.About half of people with a sudden onset of back pain feel better in just 2 weeks. About 8 in 10 people feel better by 6 weeks.  CAUSES Some common causes of back pain include:  Strain of the muscles or ligaments supporting the spine.   Wear and tear (degeneration) of the spinal discs.   Arthritis.   Direct injury to the back.  DIAGNOSIS Most of the time, the direct cause of low back pain is not known.However, back pain can be treated effectively even when the exact cause of the pain is unknown.Answering your caregiver's questions about your overall health and symptoms is one of the most accurate ways to make sure the cause of your pain is not dangerous. If your caregiver needs more information, he or she may order lab work or imaging tests (X-rays or MRIs).However, even if imaging tests show changes in your back, this usually does not require surgery. HOME CARE INSTRUCTIONS For many people, back pain returns.Since low back pain is rarely dangerous, it is often a condition that people can learn to [facility] their own.   Remain active. It is stressful on the back to sit or stand in one place. Do not sit, drive, or stand in one place for more than 30 minutes at a time. Take short walks on level surfaces as soon as pain allows.Try to increase the length of time you walk each day.   Do not stay in bed.Resting more than 1 or 2 days can delay your recovery.   Do not avoid exercise or work.Your body is made to move.It is not dangerous to be active, even though your back may hurt.Your back will likely heal faster if you return to being active before your pain is gone.   Pay attention to your body when you bend and lift. Many people have less discomfortwhen lifting if they bend their knees, keep the load close to their bodies,and avoid twisting. Often, the most comfortable positions are those that put less stress on your recovering  back.   Find a comfortable position to sleep. Use a firm  mattress and lie on your side with your knees slightly bent. If you lie on your back, put a pillow under your knees.   Only take over-the-counter or prescription medicines as directed by your caregiver. Over-the-counter medicines to reduce pain and inflammation are often the most helpful.Your caregiver may prescribe muscle relaxant drugs.These medicines help dull your pain so you can more quickly return to your normal activities and healthy exercise.   Put ice on the injured area.   Put ice in a plastic bag.   Place a towel between your skin and the bag.   Leave the ice on for 15 to 20 minutes, 3 to 4 times a day for the first 2 to 3 days. After that, ice and heat may be alternated to reduce pain and spasms.   Ask your caregiver about trying back exercises and gentle massage. This may be of some benefit.   Avoid feeling anxious or stressed.Stress increases muscle tension and can worsen back pain.It is important to recognize when you are anxious or stressed and learn ways to manage it.Exercise is a great option.  SEEK MEDICAL CARE IF:  You have pain that is not relieved with rest or medicine.   You have pain that does not improve in 1 week.   You have new symptoms.   You are generally not feeling well.  SEEK IMMEDIATE MEDICAL CARE IF:   You have pain that radiates from your back into your legs.   You develop new bowel or bladder control problems.   You have unusual weakness or numbness in your arms or legs.   You develop nausea or vomiting.   You develop abdominal pain.   You feel faint.  Document Released: 05/22/2005 Document Revised: 02/01/2011 Document Reviewed: 10/10/2010 [facility] Patient Information 2012 [place], [place].

## 2011-05-03 NOTE — Assessment & Plan Note (Signed)
No changes

## 2011-05-03 NOTE — Assessment & Plan Note (Signed)
He tells me that the nucynta was too sedating so I changed him to percocet for pain relief, overall it sounds like his back pain has not worsened or progressed

## 2011-05-03 NOTE — Progress Notes (Signed)
Subjective:    Patient ID: Brandon Lane, male    DOB: 10/22/1965, 45 y.o.   MRN: [id number]  Back Pain This is a chronic problem. The current episode started more than 1 year ago. The problem occurs intermittently. The problem has been gradually improving since onset. The pain is present in the lumbar spine. The quality of the pain is described as aching. The pain does not radiate. The pain is at a severity of 6/10. The pain is moderate. The pain is worse during the day. The symptoms are aggravated by bending. Stiffness is present all day. Pertinent negatives include no abdominal pain, bladder incontinence, bowel incontinence, chest pain, dysuria, fever, headaches, leg pain, numbness, paresis, paresthesias, pelvic pain, perianal numbness, tingling, weakness or weight loss. Risk factors include lack of exercise and obesity. He has tried analgesics for the symptoms. The treatment provided significant relief.  Hyperlipidemia This is a chronic problem. The current episode started more than 1 year ago. The problem is controlled. Recent lipid tests were reviewed and are variable. Exacerbating diseases include obesity. He has no history of chronic renal disease, diabetes, hypothyroidism, liver disease or nephrotic syndrome. Factors aggravating his hyperlipidemia include no known factors. Pertinent negatives include no chest pain, focal sensory loss, focal weakness, leg pain, myalgias or shortness of breath. Current antihyperlipidemic treatment includes fibric acid derivatives. The current treatment provides moderate improvement of lipids. Compliance problems include adherence to exercise and adherence to diet.       Review of Systems  Constitutional: Negative for fever, chills, weight loss, diaphoresis, activity change, appetite change, fatigue and unexpected weight change.  HENT: Negative.   Eyes: Negative.   Respiratory: Negative for cough, chest tightness, shortness of breath, wheezing and stridor.     Cardiovascular: Negative for chest pain, palpitations and leg swelling.  Gastrointestinal: Negative for nausea, vomiting, abdominal pain, diarrhea, constipation, abdominal distention, anal bleeding and bowel incontinence.  Genitourinary: Negative for bladder incontinence, dysuria, urgency, frequency, hematuria, flank pain, decreased urine volume, enuresis, difficulty urinating and pelvic pain.  Musculoskeletal: Positive for back pain. Negative for myalgias, joint swelling, arthralgias and gait problem.  Neurological: Negative for dizziness, tingling, tremors, focal weakness, seizures, syncope, facial asymmetry, speech difficulty, weakness, light-headedness, numbness, headaches and paresthesias.  Hematological: Negative for adenopathy. Does not bruise/bleed easily.  Psychiatric/Behavioral: Negative for suicidal ideas, hallucinations, behavioral problems, confusion, sleep disturbance, self-injury, dysphoric mood, decreased concentration and agitation. The patient is not nervous/anxious and is not hyperactive.        Objective:   Physical Exam  Vitals reviewed. Constitutional: He is oriented to person, place, and time. He appears well-developed and well-nourished. No distress.  HENT:  Head: Normocephalic and atraumatic.  Mouth/Throat: Oropharynx is clear and moist. No oropharyngeal exudate.  Eyes: Conjunctivae are normal. Right eye exhibits no discharge. Left eye exhibits no discharge. No scleral icterus.  Neck: Normal range of motion. Neck supple. No JVD present. No tracheal deviation present. No thyromegaly present.  Cardiovascular: Normal rate, regular rhythm, normal heart sounds and intact distal pulses.  Exam reveals no gallop and no friction rub.   No murmur heard. Pulmonary/Chest: Effort normal and breath sounds normal. No stridor. No respiratory distress. He has no wheezes. He has no rales. He exhibits no tenderness.  Abdominal: Soft. Bowel sounds are normal. He exhibits no distension  and no mass. There is no tenderness. There is no rebound and no guarding.  Musculoskeletal: Normal range of motion. He exhibits no edema and no tenderness.  Lumbar back: Normal. He exhibits normal range of motion, no tenderness, no bony tenderness, no swelling, no edema, no deformity, no laceration, no pain, no spasm and normal pulse.  Lymphadenopathy:    He has no cervical adenopathy.  Neurological: He is oriented to person, place, and time.  Skin: Skin is warm and dry. No rash noted. He is not diaphoretic. No erythema. No pallor.  Psychiatric: He has a normal mood and affect. His behavior is normal. Judgment and thought content normal.      Lab Results  Component Value Date   WBC 7.1 09/21/2010   HGB 14.9 09/21/2010   HCT 42.6 09/21/2010   PLT 165.0 09/21/2010   GLUCOSE 84 09/21/2010   CHOL 170 09/21/2010   TRIG 292.0* 09/21/2010   HDL 33.70* 09/21/2010   LDLDIRECT 97.3 09/21/2010   ALT 25 09/21/2010   AST 27 09/21/2010   NA 141 09/21/2010   K 4.1 09/21/2010   CL 105 09/21/2010   CREATININE 1.0 09/21/2010   BUN 13 09/21/2010   CO2 29 09/21/2010   TSH 2.37 09/21/2010      Assessment & Plan:

## 2011-06-19 ENCOUNTER — Other Ambulatory Visit: Payer: Self-pay

## 2011-06-19 DIAGNOSIS — E781 Pure hyperglyceridemia: Secondary | ICD-10-CM

## 2011-06-19 MED ORDER — FENOFIBRATE 145 MG PO TABS: 145.0000 mg | ORAL_TABLET | Freq: Every day | ORAL | Status: DC

## 2011-06-19 NOTE — Telephone Encounter (Signed)
Pt called requesting sample of Tricor. Pt advised that there a no samples, Rx sent in to pharmacy.

## 2011-08-03 ENCOUNTER — Ambulatory Visit (INDEPENDENT_AMBULATORY_CARE_PROVIDER_SITE_OTHER): Payer: BC Managed Care – PPO | Admitting: Internal Medicine

## 2011-08-03 ENCOUNTER — Ambulatory Visit: Payer: BC Managed Care – PPO | Admitting: Internal Medicine

## 2011-08-03 DIAGNOSIS — M5136 Other intervertebral disc degeneration, lumbar region: Secondary | ICD-10-CM

## 2011-08-03 DIAGNOSIS — M5137 Other intervertebral disc degeneration, lumbosacral region: Secondary | ICD-10-CM

## 2011-08-03 DIAGNOSIS — F329 Major depressive disorder, single episode, unspecified: Secondary | ICD-10-CM

## 2011-08-03 DIAGNOSIS — M545 Low back pain: Secondary | ICD-10-CM

## 2011-08-03 DIAGNOSIS — E781 Pure hyperglyceridemia: Secondary | ICD-10-CM

## 2011-08-03 MED ORDER — OXYCODONE-ACETAMINOPHEN 7.5-500 MG PO TABS: 1.0000 | ORAL_TABLET | Freq: Four times a day (QID) | ORAL | Status: DC | PRN

## 2011-08-03 MED ORDER — FENOFIBRATE 145 MG PO TABS: 145.0000 mg | ORAL_TABLET | Freq: Every day | ORAL | Status: DC

## 2011-08-03 NOTE — Patient Instructions (Signed)
Hypertriglyceridemia  Diet for High blood levels of Triglycerides Most fats in food are triglycerides. Triglycerides in your blood are stored as fat in your body. High levels of triglycerides in your blood may put you at a greater risk for heart disease and stroke.  Normal triglyceride levels are less than 150 mg/dL. Borderline high levels are 150-199 mg/dl. High levels are 200 - 499 mg/dL, and very high triglyceride levels are greater than 500 mg/dL. The decision to treat high triglycerides is generally based on the level. For people with borderline or high triglyceride levels, treatment includes weight loss and exercise. Drugs are recommended for people with very high triglyceride levels. Many people who need treatment for high triglyceride levels have metabolic syndrome. This syndrome is a collection of disorders that often include: insulin resistance, high blood pressure, blood clotting problems, high cholesterol and triglycerides. TESTING PROCEDURE FOR TRIGLYCERIDES  You should not eat 4 hours before getting your triglycerides measured. The normal range of triglycerides is between 10 and 250 milligrams per deciliter (mg/dl). Some people may have extreme levels (1000 or above), but your triglyceride level may be too high if it is above 150 mg/dl, depending on what other risk factors you have for heart disease.   People with high blood triglycerides may also have high blood cholesterol levels. If you have high blood cholesterol as well as high blood triglycerides, your risk for heart disease is probably greater than if you only had high triglycerides. High blood cholesterol is one of the main risk factors for heart disease.  CHANGING YOUR DIET  Your weight can affect your blood triglyceride level. If you are more than 20% above your ideal body weight, you may be able to lower your blood triglycerides by losing weight. Eating less and exercising regularly is the best way to combat this. Fat provides  more calories than any other food. The best way to lose weight is to eat less fat. Only 30% of your total calories should come from fat. Less than 7% of your diet should come from saturated fat. A diet low in fat and saturated fat is the same as a diet to decrease blood cholesterol. By eating a diet lower in fat, you may lose weight, lower your blood cholesterol, and lower your blood triglyceride level.  Eating a diet low in fat, especially saturated fat, may also help you lower your blood triglyceride level. Ask your dietitian to help you figure how much fat you can eat based on the number of calories your caregiver has prescribed for you.  Exercise, in addition to helping with weight loss may also help lower triglyceride levels.   Alcohol can increase blood triglycerides. You may need to stop drinking alcoholic beverages.   Too much carbohydrate in your diet may also increase your blood triglycerides. Some complex carbohydrates are necessary in your diet. These may include bread, rice, potatoes, other starchy vegetables and cereals.   Reduce "simple" carbohydrates. These may include pure sugars, candy, honey, and jelly without losing other nutrients. If you have the kind of high blood triglycerides that is affected by the amount of carbohydrates in your diet, you will need to eat less sugar and less high-sugar foods. Your caregiver can help you with this.   Adding 2-4 grams of fish oil (EPA+ DHA) may also help lower triglycerides. Speak with your caregiver before adding any supplements to your regimen.  Following the Diet  Maintain your ideal weight. Your caregivers can help you with a diet. Generally,   eating less food and getting more exercise will help you lose weight. Joining a weight control group may also help. Ask your caregivers for a good weight control group in your area.  Eat low-fat foods instead of high-fat foods. This can help you lose weight too.  These foods are lower in fat. Eat MORE  of these:   Dried beans, peas, and lentils.   Egg whites.   Low-fat cottage cheese.   Fish.   Lean cuts of meat, such as round, sirloin, rump, and flank (cut extra fat off meat you fix).   Whole grain breads, cereals and pasta.   Skim and nonfat dry milk.   Low-fat yogurt.   Poultry without the skin.   Cheese made with skim or part-skim milk, such as mozzarella, parmesan, farmers', ricotta, or pot cheese.  These are higher fat foods. Eat LESS of these:   Whole milk and foods made from whole milk, such as American, blue, cheddar, monterey jack, and swiss cheese   High-fat meats, such as luncheon meats, sausages, knockwurst, bratwurst, hot dogs, ribs, corned beef, ground pork, and regular ground beef.   Fried foods.  Limit saturated fats in your diet. Substituting unsaturated fat for saturated fat may decrease your blood triglyceride level. You will need to read package labels to know which products contain saturated fats.  These foods are high in saturated fat. Eat LESS of these:   Fried pork skins.   Whole milk.   Skin and fat from poultry.   Palm oil.   Butter.   Shortening.   Cream cheese.   Bacon.   Margarines and baked goods made from listed oils.   Vegetable shortenings.   Chitterlings.   Fat from meats.   Coconut oil.   Palm kernel oil.   Lard.   Cream.   Sour cream.   Fatback.   Coffee whiteners and non-dairy creamers made with these oils.   Cheese made from whole milk.  Use unsaturated fats (both polyunsaturated and monounsaturated) moderately. Remember, even though unsaturated fats are better than saturated fats; you still want a diet low in total fat.  These foods are high in unsaturated fat:   Canola oil.   Sunflower oil.   Mayonnaise.   Almonds.   Peanuts.   Pine nuts.   Margarines made with these oils.   Safflower oil.   Olive oil.   Avocados.   Cashews.   Peanut butter.   Sunflower seeds.   Soybean oil.     Peanut oil.   Olives.   Pecans.   Walnuts.   Pumpkin seeds.  Avoid sugar and other high-sugar foods. This will decrease carbohydrates without decreasing other nutrients. Sugar in your food goes rapidly to your blood. When there is excess sugar in your blood, your liver may use it to make more triglycerides. Sugar also contains calories without other important nutrients.  Eat LESS of these:   Sugar, brown sugar, powdered sugar, jam, jelly, preserves, honey, syrup, molasses, pies, candy, cakes, cookies, frosting, pastries, colas, soft drinks, punches, fruit drinks, and regular gelatin.   Avoid alcohol. Alcohol, even more than sugar, may increase blood triglycerides. In addition, alcohol is high in calories and low in nutrients. Ask for sparkling water, or a diet soft drink instead of an alcoholic beverage.  Suggestions for planning and preparing meals   Bake, broil, grill or roast meats instead of frying.   Remove fat from meats and skin from poultry before cooking.   Add spices,   herbs, lemon juice or vinegar to vegetables instead of salt, rich sauces or gravies.   Use a non-stick skillet without fat or use no-stick sprays.   Cool and refrigerate stews and broth. Then remove the hardened fat floating on the surface before serving.   Refrigerate meat drippings and skim off fat to make low-fat gravies.   Serve more fish.   Use less butter, margarine and other high-fat spreads on bread or vegetables.   Use skim or reconstituted non-fat dry milk for cooking.   Cook with low-fat cheeses.   Substitute low-fat yogurt or cottage cheese for all or part of the sour cream in recipes for sauces, dips or congealed salads.   Use half yogurt/half mayonnaise in salad recipes.   Substitute evaporated skim milk for cream. Evaporated skim milk or reconstituted non-fat dry milk can be whipped and substituted for whipped cream in certain recipes.   Choose fresh fruits for dessert instead of  high-fat foods such as pies or cakes. Fruits are naturally low in fat.  When Dining Out   Order low-fat appetizers such as fruit or vegetable juice, pasta with vegetables or tomato sauce.   Select clear, rather than cream soups.   Ask that dressings and gravies be served on the side. Then use less of them.   Order foods that are baked, broiled, poached, steamed, stir-fried, or roasted.   Ask for margarine instead of butter, and use only a small amount.   Drink sparkling water, unsweetened tea or coffee, or diet soft drinks instead of alcohol or other sweet beverages.  QUESTIONS AND ANSWERS ABOUT OTHER FATS IN THE BLOOD: SATURATED FAT, TRANS FAT, AND CHOLESTEROL What is trans fat? Trans fat is a type of fat that is formed when vegetable oil is hardened through a process called hydrogenation. This process helps makes foods more solid, gives them shape, and prolongs their shelf life. Trans fats are also called hydrogenated or partially hydrogenated oils.  What do saturated fat, trans fat, and cholesterol in foods have to do with heart disease? Saturated fat, trans fat, and cholesterol in the diet all raise the level of LDL "bad" cholesterol in the blood. The higher the LDL cholesterol, the greater the risk for coronary heart disease (CHD). Saturated fat and trans fat raise LDL similarly.  What foods contain saturated fat, trans fat, and cholesterol? High amounts of saturated fat are found in animal products, such as fatty cuts of meat, chicken skin, and full-fat dairy products like butter, whole milk, cream, and cheese, and in tropical vegetable oils such as palm, palm kernel, and coconut oil. Trans fat is found in some of the same foods as saturated fat, such as vegetable shortening, some margarines (especially hard or stick margarine), crackers, cookies, baked goods, fried foods, salad dressings, and other processed foods made with partially hydrogenated vegetable oils. Small amounts of trans fat  also occur naturally in some animal products, such as milk products, beef, and lamb. Foods high in cholesterol include liver, other organ meats, egg yolks, shrimp, and full-fat dairy products. How can I use the new food label to make heart-healthy food choices? Check the Nutrition Facts panel of the food label. Choose foods lower in saturated fat, trans fat, and cholesterol. For saturated fat and cholesterol, you can also use the Percent Daily Value (%DV): 5% DV or less is low, and 20% DV or more is high. (There is no %DV for trans fat.) Use the Nutrition Facts panel to choose foods low in   saturated fat and cholesterol, and if the trans fat is not listed, read the ingredients and limit products that list shortening or hydrogenated or partially hydrogenated vegetable oil, which tend to be high in trans fat. POINTS TO REMEMBER: YOU NEED A LITTLE TLC (THERAPEUTIC LIFESTYLE CHANGES)  Discuss your risk for heart disease with your caregivers, and take steps to reduce risk factors.   Change your diet. Choose foods that are low in saturated fat, trans fat, and cholesterol.   Add exercise to your daily routine if it is not already being done. Participate in physical activity of moderate intensity, like brisk walking, for at least 30 minutes on most, and preferably all days of the week. No time? Break the 30 minutes into three, 10-minute segments during the day.   Stop smoking. If you do smoke, contact your caregiver to discuss ways in which they can help you quit.   Do not use street drugs.   Maintain a normal weight.   Maintain a healthy blood pressure.   Keep up with your blood work for checking the fats in your blood as directed by your caregiver.  Document Released: 03/09/2004 Document Revised: 02/01/2011 Document Reviewed: 10/05/2008 [employer] Patient Information 2012 [employer].Back Pain, Adult Low back pain is very common. About 1 in 5 people have back pain.The cause of low back pain is  rarely dangerous. The pain often gets better over time.About half of people with a sudden onset of back pain feel better in just 2 weeks. About 8 in 10 people feel better by 6 weeks.  CAUSES Some common causes of back pain include:  Strain of the muscles or ligaments supporting the spine.   Wear and tear (degeneration) of the spinal discs.   Arthritis.   Direct injury to the back.  DIAGNOSIS Most of the time, the direct cause of low back pain is not known.However, back pain can be treated effectively even when the exact cause of the pain is unknown.Answering your caregiver's questions about your overall health and symptoms is one of the most accurate ways to make sure the cause of your pain is not dangerous. If your caregiver needs more information, he or she may order lab work or imaging tests (X-rays or MRIs).However, even if imaging tests show changes in your back, this usually does not require surgery. HOME CARE INSTRUCTIONS For many people, back pain returns.Since low back pain is rarely dangerous, it is often a condition that people can learn to manageon their own.   Remain active. It is stressful on the back to sit or stand in one place. Do not sit, drive, or stand in one place for more than 30 minutes at a time. Take short walks on level surfaces as soon as pain allows.Try to increase the length of time you walk each day.   Do not stay in bed.Resting more than 1 or 2 days can delay your recovery.   Do not avoid exercise or work.Your body is made to move.It is not dangerous to be active, even though your back may hurt.Your back will likely heal faster if you return to being active before your pain is gone.   Pay attention to your body when you bend and lift. Many people have less discomfortwhen lifting if they bend their knees, keep the load close to their bodies,and avoid twisting. Often, the most comfortable positions are those that put less stress on your recovering  back.   Find a comfortable position to sleep. Use a firm   mattress and lie on your side with your knees slightly bent. If you lie on your back, put a pillow under your knees.   Only take over-the-counter or prescription medicines as directed by your caregiver. Over-the-counter medicines to reduce pain and inflammation are often the most helpful.Your caregiver may prescribe muscle relaxant drugs.These medicines help dull your pain so you can more quickly return to your normal activities and healthy exercise.   Put ice on the injured area.   Put ice in a plastic bag.   Place a towel between your skin and the bag.   Leave the ice on for 15 to 20 minutes, 3 to 4 times a day for the first 2 to 3 days. After that, ice and heat may be alternated to reduce pain and spasms.   Ask your caregiver about trying back exercises and gentle massage. This may be of some benefit.   Avoid feeling anxious or stressed.Stress increases muscle tension and can worsen back pain.It is important to recognize when you are anxious or stressed and learn ways to manage it.Exercise is a great option.  SEEK MEDICAL CARE IF:  You have pain that is not relieved with rest or medicine.   You have pain that does not improve in 1 week.   You have new symptoms.   You are generally not feeling well.  SEEK IMMEDIATE MEDICAL CARE IF:   You have pain that radiates from your back into your legs.   You develop new bowel or bladder control problems.   You have unusual weakness or numbness in your arms or legs.   You develop nausea or vomiting.   You develop abdominal pain.   You feel faint.  Document Released: 05/22/2005 Document Revised: 02/01/2011 Document Reviewed: 10/10/2010 [employer] Patient Information 2012 [employer]. 

## 2011-08-03 NOTE — Progress Notes (Signed)
Subjective:    Patient ID: Brandon Lane, male    DOB: [birth year], 46 y.o.   MRN: [id number]  Arthritis Presents for follow-up visit. He complains of pain. He reports no stiffness, joint swelling or joint warmth. The symptoms have been stable. Affected locations include the right knee and left knee. His pain is at a severity of 6/10. Pertinent negatives include no diarrhea, dry eyes, dry mouth, dysuria, fatigue, fever, pain at night, pain while resting, rash, Raynaud's syndrome, uveitis or weight loss. Compliance with total regimen is 51-75%.      Review of Systems  Constitutional: Negative for fever, chills, weight loss, diaphoresis, activity change, appetite change, fatigue and unexpected weight change.  HENT: Negative.   Eyes: Negative.   Respiratory: Negative for cough, chest tightness, shortness of breath, wheezing and stridor.   Cardiovascular: Negative for chest pain, palpitations and leg swelling.  Gastrointestinal: Negative for nausea, vomiting, abdominal pain, diarrhea, constipation, blood in stool and abdominal distention.  Genitourinary: Negative.  Negative for dysuria.  Musculoskeletal: Positive for back pain (chronic, unchanged), arthralgias and arthritis. Negative for myalgias, joint swelling, gait problem and stiffness.  Skin: Positive for wound. Negative for color change, pallor and rash.  Neurological: Negative for dizziness, tremors, seizures, syncope, facial asymmetry, speech difficulty, weakness, light-headedness, numbness and headaches.  Hematological: Negative for adenopathy. Does not bruise/bleed easily.  Psychiatric/Behavioral: Positive for dysphoric mood. Negative for suicidal ideas, hallucinations, behavioral problems, confusion, sleep disturbance, self-injury, decreased concentration and agitation. The patient is not nervous/anxious and is not hyperactive.        Objective:   Physical Exam  Vitals reviewed. Constitutional: He is oriented to person, place, and  time. He appears well-developed and well-nourished. No distress.  HENT:  Head: Normocephalic and atraumatic.  Mouth/Throat: Oropharynx is clear and moist. No oropharyngeal exudate.  Eyes: Conjunctivae are normal. Right eye exhibits no discharge. Left eye exhibits no discharge. No scleral icterus.  Neck: Normal range of motion. Neck supple. No JVD present. No tracheal deviation present. No thyromegaly present.  Cardiovascular: Normal rate, regular rhythm, normal heart sounds and intact distal pulses.  Exam reveals no gallop and no friction rub.   No murmur heard. Pulmonary/Chest: Effort normal and breath sounds normal. No stridor. No respiratory distress. He has no wheezes. He has no rales. He exhibits no tenderness.  Abdominal: Soft. Bowel sounds are normal. He exhibits no distension and no mass. There is no tenderness. There is no rebound and no guarding.  Musculoskeletal: Normal range of motion. He exhibits no edema.       Lumbar back: Normal. He exhibits normal range of motion, no tenderness, no bony tenderness, no swelling, no edema, no deformity, no laceration, no pain, no spasm and normal pulse.  Lymphadenopathy:    He has no cervical adenopathy.  Neurological: He is oriented to person, place, and time.  Skin: Skin is warm and dry. No rash noted. He is not diaphoretic. No erythema. No pallor.  Psychiatric: He has a normal mood and affect. His behavior is normal. Judgment and thought content normal.      Lab Results  Component Value Date   WBC 7.1 09/21/2010   HGB 14.9 09/21/2010   HCT 42.6 09/21/2010   PLT 165.0 09/21/2010   GLUCOSE 75 05/03/2011   CHOL 172 05/03/2011   TRIG 131.0 05/03/2011   HDL 47.70 05/03/2011   LDLDIRECT 97.3 09/21/2010   LDLCALC 98 05/03/2011   ALT 25 05/03/2011   AST 26 05/03/2011   NA 140 05/03/2011  K 4.1 05/03/2011   CL 102 05/03/2011   CREATININE 1.1 05/03/2011   BUN 15 05/03/2011   CO2 28 05/03/2011   TSH 2.37 09/21/2010      Assessment &  Plan:

## 2011-08-07 ENCOUNTER — Encounter: Payer: Self-pay | Admitting: Internal Medicine

## 2011-08-07 NOTE — Assessment & Plan Note (Signed)
Unchanged, continue current meds 

## 2011-08-07 NOTE — Assessment & Plan Note (Signed)
Continue current meds 

## 2011-08-07 NOTE — Assessment & Plan Note (Signed)
He is doing well on tricor

## 2011-08-07 NOTE — Assessment & Plan Note (Signed)
Continue cymbalta  

## 2011-10-31 ENCOUNTER — Other Ambulatory Visit (INDEPENDENT_AMBULATORY_CARE_PROVIDER_SITE_OTHER): Payer: BC Managed Care – PPO

## 2011-10-31 ENCOUNTER — Ambulatory Visit (INDEPENDENT_AMBULATORY_CARE_PROVIDER_SITE_OTHER): Payer: BC Managed Care – PPO | Admitting: Internal Medicine

## 2011-10-31 ENCOUNTER — Encounter: Payer: Self-pay | Admitting: Internal Medicine

## 2011-10-31 VITALS — BP 114/70 | HR 69 | Temp 97.2°F | Resp 20 | Wt 229.0 lb

## 2011-10-31 DIAGNOSIS — M5136 Other intervertebral disc degeneration, lumbar region: Secondary | ICD-10-CM

## 2011-10-31 DIAGNOSIS — M5137 Other intervertebral disc degeneration, lumbosacral region: Secondary | ICD-10-CM

## 2011-10-31 DIAGNOSIS — E781 Pure hyperglyceridemia: Secondary | ICD-10-CM

## 2011-10-31 DIAGNOSIS — M545 Low back pain, unspecified: Secondary | ICD-10-CM

## 2011-10-31 DIAGNOSIS — M51369 Other intervertebral disc degeneration, lumbar region without mention of lumbar back pain or lower extremity pain: Secondary | ICD-10-CM

## 2011-10-31 DIAGNOSIS — F329 Major depressive disorder, single episode, unspecified: Secondary | ICD-10-CM

## 2011-10-31 DIAGNOSIS — F32A Depression, unspecified: Secondary | ICD-10-CM

## 2011-10-31 LAB — LIPID PANEL
HDL: 47 mg/dL (ref >39.00)
LDL Cholesterol: 84 mg/dL (ref 0–99)
Total CHOL/HDL Ratio: 3
VLDL: 16.2 mg/dL (ref 0.0–40.0)

## 2011-10-31 LAB — COMPREHENSIVE METABOLIC PANEL
ALT: 23 U/L (ref 0–53)
AST: 22 U/L (ref 0–37)
Creatinine, Ser: 0.9 mg/dL (ref 0.4–1.5)
Sodium: 140 mEq/L (ref 135–145)
Total Bilirubin: 0.7 mg/dL (ref 0.3–1.2)
Total Protein: 7.1 g/dL (ref 6.0–8.3)

## 2011-10-31 MED ORDER — OXYCODONE-ACETAMINOPHEN 10-325 MG PO TABS: 1.0000 | ORAL_TABLET | Freq: Three times a day (TID) | ORAL | Status: DC | PRN

## 2011-10-31 NOTE — Patient Instructions (Signed)
Hypertriglyceridemia  Diet for High blood levels of Triglycerides Most fats in food are triglycerides. Triglycerides in your blood are stored as fat in your body. High levels of triglycerides in your blood may put you at a greater risk for heart disease and stroke.  Normal triglyceride levels are less than 150 mg/dL. Borderline high levels are 150-199 mg/dl. High levels are 200 - 499 mg/dL, and very high triglyceride levels are greater than 500 mg/dL. The decision to treat high triglycerides is generally based on the level. For people with borderline or high triglyceride levels, treatment includes weight loss and exercise. Drugs are recommended for people with very high triglyceride levels. Many people who need treatment for high triglyceride levels have metabolic syndrome. This syndrome is a collection of disorders that often include: insulin resistance, high blood pressure, blood clotting problems, high cholesterol and triglycerides. TESTING PROCEDURE FOR TRIGLYCERIDES  You should not eat 4 hours before getting your triglycerides measured. The normal range of triglycerides is between 10 and 250 milligrams per deciliter (mg/dl). Some people may have extreme levels (1000 or above), but your triglyceride level may be too high if it is above 150 mg/dl, depending on what other risk factors you have for heart disease.   People with high blood triglycerides may also have high blood cholesterol levels. If you have high blood cholesterol as well as high blood triglycerides, your risk for heart disease is probably greater than if you only had high triglycerides. High blood cholesterol is one of the main risk factors for heart disease.  CHANGING YOUR DIET  Your weight can affect your blood triglyceride level. If you are more than 20% above your ideal body weight, you may be able to lower your blood triglycerides by losing weight. Eating less and exercising regularly is the best way to combat this. Fat provides  more calories than any other food. The best way to lose weight is to eat less fat. Only 30% of your total calories should come from fat. Less than 7% of your diet should come from saturated fat. A diet low in fat and saturated fat is the same as a diet to decrease blood cholesterol. By eating a diet lower in fat, you may lose weight, lower your blood cholesterol, and lower your blood triglyceride level.  Eating a diet low in fat, especially saturated fat, may also help you lower your blood triglyceride level. Ask your dietitian to help you figure how much fat you can eat based on the number of calories your caregiver has prescribed for you.  Exercise, in addition to helping with weight loss may also help lower triglyceride levels.   Alcohol can increase blood triglycerides. You may need to stop drinking alcoholic beverages.   Too much carbohydrate in your diet may also increase your blood triglycerides. Some complex carbohydrates are necessary in your diet. These may include bread, rice, potatoes, other starchy vegetables and cereals.   Reduce "simple" carbohydrates. These may include pure sugars, candy, honey, and jelly without losing other nutrients. If you have the kind of high blood triglycerides that is affected by the amount of carbohydrates in your diet, you will need to eat less sugar and less high-sugar foods. Your caregiver can help you with this.   Adding 2-4 grams of fish oil (EPA+ DHA) may also help lower triglycerides. Speak with your caregiver before adding any supplements to your regimen.  Following the Diet  Maintain your ideal weight. Your caregivers can help you with a diet. Generally,   eating less food and getting more exercise will help you lose weight. Joining a weight control group may also help. Ask your caregivers for a good weight control group in your area.  Eat low-fat foods instead of high-fat foods. This can help you lose weight too.  These foods are lower in fat. Eat MORE  of these:   Dried beans, peas, and lentils.   Egg whites.   Low-fat cottage cheese.   Fish.   Lean cuts of meat, such as round, sirloin, rump, and flank (cut extra fat off meat you fix).   Whole grain breads, cereals and pasta.   Skim and nonfat dry milk.   Low-fat yogurt.   Poultry without the skin.   Cheese made with skim or part-skim milk, such as mozzarella, parmesan, farmers', ricotta, or pot cheese.  These are higher fat foods. Eat LESS of these:   Whole milk and foods made from whole milk, such as American, blue, cheddar, monterey jack, and swiss cheese   High-fat meats, such as luncheon meats, sausages, knockwurst, bratwurst, hot dogs, ribs, corned beef, ground pork, and regular ground beef.   Fried foods.  Limit saturated fats in your diet. Substituting unsaturated fat for saturated fat may decrease your blood triglyceride level. You will need to read package labels to know which products contain saturated fats.  These foods are high in saturated fat. Eat LESS of these:   Fried pork skins.   Whole milk.   Skin and fat from poultry.   Palm oil.   Butter.   Shortening.   Cream cheese.   Bacon.   Margarines and baked goods made from listed oils.   Vegetable shortenings.   Chitterlings.   Fat from meats.   Coconut oil.   Palm kernel oil.   Lard.   Cream.   Sour cream.   Fatback.   Coffee whiteners and non-dairy creamers made with these oils.   Cheese made from whole milk.  Use unsaturated fats (both polyunsaturated and monounsaturated) moderately. Remember, even though unsaturated fats are better than saturated fats; you still want a diet low in total fat.  These foods are high in unsaturated fat:   Canola oil.   Sunflower oil.   Mayonnaise.   Almonds.   Peanuts.   Pine nuts.   Margarines made with these oils.   Safflower oil.   Olive oil.   Avocados.   Cashews.   Peanut butter.   Sunflower seeds.   Soybean oil.     Peanut oil.   Olives.   Pecans.   Walnuts.   Pumpkin seeds.  Avoid sugar and other high-sugar foods. This will decrease carbohydrates without decreasing other nutrients. Sugar in your food goes rapidly to your blood. When there is excess sugar in your blood, your liver may use it to make more triglycerides. Sugar also contains calories without other important nutrients.  Eat LESS of these:   Sugar, brown sugar, powdered sugar, jam, jelly, preserves, honey, syrup, molasses, pies, candy, cakes, cookies, frosting, pastries, colas, soft drinks, punches, fruit drinks, and regular gelatin.   Avoid alcohol. Alcohol, even more than sugar, may increase blood triglycerides. In addition, alcohol is high in calories and low in nutrients. Ask for sparkling water, or a diet soft drink instead of an alcoholic beverage.  Suggestions for planning and preparing meals   Bake, broil, grill or roast meats instead of frying.   Remove fat from meats and skin from poultry before cooking.   Add spices,   herbs, lemon juice or vinegar to vegetables instead of salt, rich sauces or gravies.   Use a non-stick skillet without fat or use no-stick sprays.   Cool and refrigerate stews and broth. Then remove the hardened fat floating on the surface before serving.   Refrigerate meat drippings and skim off fat to make low-fat gravies.   Serve more fish.   Use less butter, margarine and other high-fat spreads on bread or vegetables.   Use skim or reconstituted non-fat dry milk for cooking.   Cook with low-fat cheeses.   Substitute low-fat yogurt or cottage cheese for all or part of the sour cream in recipes for sauces, dips or congealed salads.   Use half yogurt/half mayonnaise in salad recipes.   Substitute evaporated skim milk for cream. Evaporated skim milk or reconstituted non-fat dry milk can be whipped and substituted for whipped cream in certain recipes.   Choose fresh fruits for dessert instead of  high-fat foods such as pies or cakes. Fruits are naturally low in fat.  When Dining Out   Order low-fat appetizers such as fruit or vegetable juice, pasta with vegetables or tomato sauce.   Select clear, rather than cream soups.   Ask that dressings and gravies be served on the side. Then use less of them.   Order foods that are baked, broiled, poached, steamed, stir-fried, or roasted.   Ask for margarine instead of butter, and use only a small amount.   Drink sparkling water, unsweetened tea or coffee, or diet soft drinks instead of alcohol or other sweet beverages.  QUESTIONS AND ANSWERS ABOUT OTHER FATS IN THE BLOOD: SATURATED FAT, TRANS FAT, AND CHOLESTEROL What is trans fat? Trans fat is a type of fat that is formed when vegetable oil is hardened through a process called hydrogenation. This process helps makes foods more solid, gives them shape, and prolongs their shelf life. Trans fats are also called hydrogenated or partially hydrogenated oils.  What do saturated fat, trans fat, and cholesterol in foods have to do with heart disease? Saturated fat, trans fat, and cholesterol in the diet all raise the level of LDL "bad" cholesterol in the blood. The higher the LDL cholesterol, the greater the risk for coronary heart disease (CHD). Saturated fat and trans fat raise LDL similarly.  What foods contain saturated fat, trans fat, and cholesterol? High amounts of saturated fat are found in animal products, such as fatty cuts of meat, chicken skin, and full-fat dairy products like butter, whole milk, cream, and cheese, and in tropical vegetable oils such as palm, palm kernel, and coconut oil. Trans fat is found in some of the same foods as saturated fat, such as vegetable shortening, some margarines (especially hard or stick margarine), crackers, cookies, baked goods, fried foods, salad dressings, and other processed foods made with partially hydrogenated vegetable oils. Small amounts of trans fat  also occur naturally in some animal products, such as milk products, beef, and lamb. Foods high in cholesterol include liver, other organ meats, egg yolks, shrimp, and full-fat dairy products. How can I use the new food label to make heart-healthy food choices? Check the Nutrition Facts panel of the food label. Choose foods lower in saturated fat, trans fat, and cholesterol. For saturated fat and cholesterol, you can also use the Percent Daily Value (%DV): 5% DV or less is low, and 20% DV or more is high. (There is no %DV for trans fat.) Use the Nutrition Facts panel to choose foods low in   saturated fat and cholesterol, and if the trans fat is not listed, read the ingredients and limit products that list shortening or hydrogenated or partially hydrogenated vegetable oil, which tend to be high in trans fat. POINTS TO REMEMBER: YOU NEED A LITTLE TLC (THERAPEUTIC LIFESTYLE CHANGES)  Discuss your risk for heart disease with your caregivers, and take steps to reduce risk factors.   Change your diet. Choose foods that are low in saturated fat, trans fat, and cholesterol.   Add exercise to your daily routine if it is not already being done. Participate in physical activity of moderate intensity, like brisk walking, for at least 30 minutes on most, and preferably all days of the week. No time? Break the 30 minutes into three, 10-minute segments during the day.   Stop smoking. If you do smoke, contact your caregiver to discuss ways in which they can help you quit.   Do not use street drugs.   Maintain a normal weight.   Maintain a healthy blood pressure.   Keep up with your blood work for checking the fats in your blood as directed by your caregiver.  Document Released: 03/09/2004 Document Revised: 05/11/2011 Document Reviewed: 10/05/2008 [employer] Patient Information 2012 [employer].Back Pain, Adult Low back pain is very common. About 1 in 5 people have back pain.The cause of low back pain is  rarely dangerous. The pain often gets better over time.About half of people with a sudden onset of back pain feel better in just 2 weeks. About 8 in 10 people feel better by 6 weeks.  CAUSES Some common causes of back pain include:  Strain of the muscles or ligaments supporting the spine.   Wear and tear (degeneration) of the spinal discs.   Arthritis.   Direct injury to the back.  DIAGNOSIS Most of the time, the direct cause of low back pain is not known.However, back pain can be treated effectively even when the exact cause of the pain is unknown.Answering your caregiver's questions about your overall health and symptoms is one of the most accurate ways to make sure the cause of your pain is not dangerous. If your caregiver needs more information, he or she may order lab work or imaging tests (X-rays or MRIs).However, even if imaging tests show changes in your back, this usually does not require surgery. HOME CARE INSTRUCTIONS For many people, back pain returns.Since low back pain is rarely dangerous, it is often a condition that people can learn to manageon their own.   Remain active. It is stressful on the back to sit or stand in one place. Do not sit, drive, or stand in one place for more than 30 minutes at a time. Take short walks on level surfaces as soon as pain allows.Try to increase the length of time you walk each day.   Do not stay in bed.Resting more than 1 or 2 days can delay your recovery.   Do not avoid exercise or work.Your body is made to move.It is not dangerous to be active, even though your back may hurt.Your back will likely heal faster if you return to being active before your pain is gone.   Pay attention to your body when you bend and lift. Many people have less discomfortwhen lifting if they bend their knees, keep the load close to their bodies,and avoid twisting. Often, the most comfortable positions are those that put less stress on your recovering  back.   Find a comfortable position to sleep. Use a firm   mattress and lie on your side with your knees slightly bent. If you lie on your back, put a pillow under your knees.   Only take over-the-counter or prescription medicines as directed by your caregiver. Over-the-counter medicines to reduce pain and inflammation are often the most helpful.Your caregiver may prescribe muscle relaxant drugs.These medicines help dull your pain so you can more quickly return to your normal activities and healthy exercise.   Put ice on the injured area.   Put ice in a plastic bag.   Place a towel between your skin and the bag.   Leave the ice on for 15 to 20 minutes, 3 to 4 times a day for the first 2 to 3 days. After that, ice and heat may be alternated to reduce pain and spasms.   Ask your caregiver about trying back exercises and gentle massage. This may be of some benefit.   Avoid feeling anxious or stressed.Stress increases muscle tension and can worsen back pain.It is important to recognize when you are anxious or stressed and learn ways to manage it.Exercise is a great option.  SEEK MEDICAL CARE IF:  You have pain that is not relieved with rest or medicine.   You have pain that does not improve in 1 week.   You have new symptoms.   You are generally not feeling well.  SEEK IMMEDIATE MEDICAL CARE IF:   You have pain that radiates from your back into your legs.   You develop new bowel or bladder control problems.   You have unusual weakness or numbness in your arms or legs.   You develop nausea or vomiting.   You develop abdominal pain.   You feel faint.  Document Released: 05/22/2005 Document Revised: 05/11/2011 Document Reviewed: 10/10/2010 [employer] Patient Information 2012 [employer]. 

## 2011-10-31 NOTE — Progress Notes (Signed)
Subjective:    Patient ID: Brandon Lane, male    DOB: 1966-05-17, 46 y.o.   MRN: [id number]  Back Pain This is a chronic problem. The current episode started more than 1 year ago. The problem occurs intermittently. The problem is unchanged. The pain is present in the lumbar spine. The quality of the pain is described as aching. The pain does not radiate. The pain is at a severity of 6/10. The pain is moderate. The pain is worse during the day. The symptoms are aggravated by twisting. Stiffness is present all day. Pertinent negatives include no abdominal pain, bladder incontinence, bowel incontinence, chest pain, dysuria, fever, headaches, leg pain, numbness, paresis, paresthesias, pelvic pain, perianal numbness, tingling, weakness or weight loss. Risk factors include lack of exercise and obesity. He has tried analgesics for the symptoms. The treatment provided moderate relief.      Review of Systems  Constitutional: Negative for fever, chills, weight loss, diaphoresis, activity change, appetite change, fatigue and unexpected weight change.  HENT: Negative.   Eyes: Negative.   Respiratory: Negative for apnea, cough, chest tightness, shortness of breath, wheezing and stridor.   Cardiovascular: Negative for chest pain, palpitations and leg swelling.  Gastrointestinal: Negative for nausea, vomiting, abdominal pain, diarrhea, constipation, blood in stool, abdominal distention and bowel incontinence.  Genitourinary: Negative for bladder incontinence, dysuria, urgency, frequency, hematuria, flank pain, decreased urine volume, enuresis, difficulty urinating and pelvic pain.  Musculoskeletal: Positive for back pain. Negative for myalgias, joint swelling, arthralgias and gait problem.  Skin: Negative for color change, pallor, rash and wound.  Neurological: Negative.  Negative for tingling, weakness, numbness, headaches and paresthesias.  Hematological: Negative for adenopathy. Does not bruise/bleed  easily.  Psychiatric/Behavioral: Negative.  Negative for suicidal ideas, hallucinations, behavioral problems, confusion, sleep disturbance, self-injury, dysphoric mood, decreased concentration and agitation. The patient is not nervous/anxious and is not hyperactive.        Objective:   Physical Exam  Vitals reviewed. Constitutional: He is oriented to person, place, and time. He appears well-developed and well-nourished. No distress.  HENT:  Head: Normocephalic and atraumatic.  Mouth/Throat: Oropharynx is clear and moist. No oropharyngeal exudate.  Eyes: Conjunctivae are normal. Right eye exhibits no discharge. Left eye exhibits no discharge. No scleral icterus.  Neck: Normal range of motion. Neck supple. No JVD present. No tracheal deviation present. No thyromegaly present.  Cardiovascular: Normal rate, regular rhythm, normal heart sounds and intact distal pulses.  Exam reveals no gallop and no friction rub.   No murmur heard. Pulmonary/Chest: Effort normal and breath sounds normal. No stridor. No respiratory distress. He has no wheezes. He has no rales. He exhibits no tenderness.  Abdominal: Soft. Bowel sounds are normal. He exhibits no distension. There is no tenderness. There is no rebound and no guarding.  Musculoskeletal: Normal range of motion. He exhibits no edema and no tenderness.       Lumbar back: Normal. He exhibits normal range of motion, no tenderness, no bony tenderness, no swelling, no edema, no deformity, no laceration, no pain, no spasm and normal pulse.  Lymphadenopathy:    He has no cervical adenopathy.  Neurological: He is alert and oriented to person, place, and time. He has normal strength. He displays no atrophy, no tremor and normal reflexes. No cranial nerve deficit or sensory deficit. He exhibits normal muscle tone. He displays a negative Romberg sign. He displays no seizure activity. Coordination and gait normal.  Reflex Scores:      Tricep reflexes are 1+  on the  right side and 1+ on the left side.      Bicep reflexes are 1+ on the right side and 1+ on the left side.      Brachioradialis reflexes are 1+ on the right side and 1+ on the left side.      Patellar reflexes are 2+ on the right side and 2+ on the left side.      Achilles reflexes are 2+ on the right side and 2+ on the left side.      - SLR in BLE  Skin: Skin is warm and dry. No rash noted. He is not diaphoretic. No erythema. No pallor.  Psychiatric: He has a normal mood and affect. His behavior is normal. Judgment and thought content normal.      Lab Results  Component Value Date   WBC 7.1 09/21/2010   HGB 14.9 09/21/2010   HCT 42.6 09/21/2010   PLT 165.0 09/21/2010   GLUCOSE 75 05/03/2011   CHOL 172 05/03/2011   TRIG 131.0 05/03/2011   HDL 47.70 05/03/2011   LDLDIRECT 97.3 09/21/2010   LDLCALC 98 05/03/2011   ALT 25 05/03/2011   AST 26 05/03/2011   NA 140 05/03/2011   K 4.1 05/03/2011   CL 102 05/03/2011   CREATININE 1.1 05/03/2011   BUN 15 05/03/2011   CO2 28 05/03/2011   TSH 2.37 09/21/2010      Assessment & Plan:

## 2011-11-01 NOTE — Assessment & Plan Note (Signed)
He wants to try a higher dose of percocet so I increased the dose from 7.5 to 10 mg per tab

## 2011-11-01 NOTE — Assessment & Plan Note (Signed)
He is doing well on cymbalta 

## 2011-11-01 NOTE — Assessment & Plan Note (Signed)
He is doing well on tricor, I will check his FLP today

## 2011-12-01 ENCOUNTER — Ambulatory Visit: Payer: BC Managed Care – PPO | Admitting: Internal Medicine

## 2012-01-25 ENCOUNTER — Ambulatory Visit: Payer: BC Managed Care – PPO | Admitting: Internal Medicine

## 2012-01-26 ENCOUNTER — Ambulatory Visit (INDEPENDENT_AMBULATORY_CARE_PROVIDER_SITE_OTHER): Payer: BC Managed Care – PPO | Admitting: Internal Medicine

## 2012-01-26 ENCOUNTER — Encounter: Payer: Self-pay | Admitting: Internal Medicine

## 2012-01-26 VITALS — BP 118/72 | HR 78 | Temp 97.5°F | Resp 16 | Wt 231.0 lb

## 2012-01-26 DIAGNOSIS — M5137 Other intervertebral disc degeneration, lumbosacral region: Secondary | ICD-10-CM

## 2012-01-26 DIAGNOSIS — M545 Low back pain: Secondary | ICD-10-CM

## 2012-01-26 DIAGNOSIS — E781 Pure hyperglyceridemia: Secondary | ICD-10-CM

## 2012-01-26 DIAGNOSIS — M5136 Other intervertebral disc degeneration, lumbar region: Secondary | ICD-10-CM

## 2012-01-26 DIAGNOSIS — F329 Major depressive disorder, single episode, unspecified: Secondary | ICD-10-CM

## 2012-01-26 MED ORDER — OXYCODONE-ACETAMINOPHEN 10-325 MG PO TABS: 1.0000 | ORAL_TABLET | Freq: Three times a day (TID) | ORAL | Status: DC | PRN

## 2012-01-26 MED ORDER — DULOXETINE HCL 30 MG PO CPEP: 30.0000 mg | ORAL_CAPSULE | Freq: Every day | ORAL | Status: DC

## 2012-01-26 NOTE — Assessment & Plan Note (Signed)
He is not fasting today and tells me that he has decided not to take meds for this

## 2012-01-26 NOTE — Assessment & Plan Note (Signed)
He will continue percocet for pain relief

## 2012-01-26 NOTE — Progress Notes (Signed)
Subjective:    Patient ID: Brandon Lane, male    DOB: May 07, 1966, 46 y.o.   MRN: [id number]  Back Pain This is a chronic problem. The current episode started more than 1 year ago. The problem occurs constantly. The problem is unchanged. The pain is present in the lumbar spine. The quality of the pain is described as aching. The pain does not radiate. The pain is at a severity of 6/10. The pain is mild. The pain is worse during the day. The symptoms are aggravated by bending and standing. Pertinent negatives include no abdominal pain, bladder incontinence, bowel incontinence, chest pain, dysuria, fever, headaches, leg pain, numbness, paresis, paresthesias, pelvic pain, perianal numbness, tingling, weakness or weight loss. Risk factors include obesity and lack of exercise. He has tried analgesics for the symptoms. The treatment provided significant relief.  Hyperlipidemia This is a chronic problem. The current episode started more than 1 year ago. The problem is resistant. Recent lipid tests were reviewed and are variable. Exacerbating diseases include obesity. He has no history of chronic renal disease, diabetes, hypothyroidism, liver disease or nephrotic syndrome. Factors aggravating his hyperlipidemia include fatty foods. Pertinent negatives include no chest pain, focal sensory loss, focal weakness, leg pain, myalgias or shortness of breath. He is currently on no antihyperlipidemic treatment. The current treatment provides mild improvement of lipids. Compliance problems include adherence to exercise, adherence to diet and psychosocial issues.       Review of Systems  Constitutional: Negative.  Negative for fever and weight loss.  HENT: Negative.   Eyes: Negative.   Respiratory: Negative for cough, chest tightness, shortness of breath, wheezing and stridor.   Cardiovascular: Negative for chest pain and leg swelling.  Gastrointestinal: Negative.  Negative for nausea, vomiting, abdominal pain,  diarrhea, constipation and bowel incontinence.  Genitourinary: Negative.  Negative for bladder incontinence, dysuria and pelvic pain.  Musculoskeletal: Positive for back pain. Negative for myalgias, joint swelling, arthralgias and gait problem.  Skin: Negative for color change, pallor, rash and wound.  Neurological: Negative.  Negative for tingling, focal weakness, weakness, numbness, headaches and paresthesias.  Hematological: Negative for adenopathy. Does not bruise/bleed easily.  Psychiatric/Behavioral: Negative.        Objective:   Physical Exam  Vitals reviewed. Constitutional: He is oriented to person, place, and time. He appears well-developed and well-nourished. No distress.  HENT:  Head: Normocephalic and atraumatic.  Mouth/Throat: Oropharynx is clear and moist. No oropharyngeal exudate.  Eyes: Conjunctivae are normal. Right eye exhibits no discharge. Left eye exhibits no discharge. No scleral icterus.  Neck: Normal range of motion. Neck supple. No JVD present. No tracheal deviation present. No thyromegaly present.  Cardiovascular: Normal rate, regular rhythm, normal heart sounds and intact distal pulses.  Exam reveals no gallop and no friction rub.   No murmur heard. Pulmonary/Chest: Effort normal and breath sounds normal. No stridor. No respiratory distress. He has no wheezes. He has no rales. He exhibits no tenderness.  Abdominal: Soft. Bowel sounds are normal. He exhibits no distension and no mass. There is no tenderness. There is no rebound and no guarding.  Musculoskeletal: Normal range of motion. He exhibits no edema and no tenderness.       Lumbar back: Normal. He exhibits normal range of motion, no tenderness, no bony tenderness, no swelling, no edema, no deformity, no laceration, no pain, no spasm and normal pulse.  Lymphadenopathy:    He has no cervical adenopathy.  Neurological: He is alert and oriented to person, place, and  time. He has normal strength. He displays no  atrophy, no tremor and normal reflexes. No cranial nerve deficit or sensory deficit. He exhibits normal muscle tone. He displays a negative Romberg sign. He displays no seizure activity. Coordination and gait normal.  Reflex Scores:      Tricep reflexes are 1+ on the right side and 1+ on the left side.      Bicep reflexes are 1+ on the right side.      Brachioradialis reflexes are 1+ on the right side and 1+ on the left side.      Patellar reflexes are 2+ on the right side and 2+ on the left side.      Achilles reflexes are 2+ on the right side and 2+ on the left side.      - SLR in BLE  Skin: Skin is warm and dry. No rash noted. He is not diaphoretic. No erythema. No pallor.  Psychiatric: He has a normal mood and affect. His behavior is normal. Judgment and thought content normal.      Lab Results  Component Value Date   WBC 7.1 09/21/2010   HGB 14.9 09/21/2010   HCT 42.6 09/21/2010   PLT 165.0 09/21/2010   GLUCOSE 89 10/31/2011   CHOL 147 10/31/2011   TRIG 81.0 10/31/2011   HDL 47.00 10/31/2011   LDLDIRECT 97.3 09/21/2010   LDLCALC 84 10/31/2011   ALT 23 10/31/2011   AST 22 10/31/2011   NA 140 10/31/2011   K 4.8 10/31/2011   CL 104 10/31/2011   CREATININE 0.9 10/31/2011   BUN 14 10/31/2011   CO2 31 10/31/2011   TSH 2.37 09/21/2010      Assessment & Plan:

## 2012-01-26 NOTE — Patient Instructions (Signed)
Hypertriglyceridemia  Diet for High blood levels of Triglycerides Most fats in food are triglycerides. Triglycerides in your blood are stored as fat in your body. High levels of triglycerides in your blood may put you at a greater risk for heart disease and stroke.  Normal triglyceride levels are less than 150 mg/dL. Borderline high levels are 150-199 mg/dl. High levels are 200 - 499 mg/dL, and very high triglyceride levels are greater than 500 mg/dL. The decision to treat high triglycerides is generally based on the level. For people with borderline or high triglyceride levels, treatment includes weight loss and exercise. Drugs are recommended for people with very high triglyceride levels. Many people who need treatment for high triglyceride levels have metabolic syndrome. This syndrome is a collection of disorders that often include: insulin resistance, high blood pressure, blood clotting problems, high cholesterol and triglycerides. TESTING PROCEDURE FOR TRIGLYCERIDES  You should not eat 4 hours before getting your triglycerides measured. The normal range of triglycerides is between 10 and 250 milligrams per deciliter (mg/dl). Some people may have extreme levels (1000 or above), but your triglyceride level may be too high if it is above 150 mg/dl, depending on what other risk factors you have for heart disease.   People with high blood triglycerides may also have high blood cholesterol levels. If you have high blood cholesterol as well as high blood triglycerides, your risk for heart disease is probably greater than if you only had high triglycerides. High blood cholesterol is one of the main risk factors for heart disease.  CHANGING YOUR DIET  Your weight can affect your blood triglyceride level. If you are more than 20% above your ideal body weight, you may be able to lower your blood triglycerides by losing weight. Eating less and exercising regularly is the best way to combat this. Fat provides  more calories than any other food. The best way to lose weight is to eat less fat. Only 30% of your total calories should come from fat. Less than 7% of your diet should come from saturated fat. A diet low in fat and saturated fat is the same as a diet to decrease blood cholesterol. By eating a diet lower in fat, you may lose weight, lower your blood cholesterol, and lower your blood triglyceride level.  Eating a diet low in fat, especially saturated fat, may also help you lower your blood triglyceride level. Ask your dietitian to help you figure how much fat you can eat based on the number of calories your caregiver has prescribed for you.  Exercise, in addition to helping with weight loss may also help lower triglyceride levels.   Alcohol can increase blood triglycerides. You may need to stop drinking alcoholic beverages.   Too much carbohydrate in your diet may also increase your blood triglycerides. Some complex carbohydrates are necessary in your diet. These may include bread, rice, potatoes, other starchy vegetables and cereals.   Reduce "simple" carbohydrates. These may include pure sugars, candy, honey, and jelly without losing other nutrients. If you have the kind of high blood triglycerides that is affected by the amount of carbohydrates in your diet, you will need to eat less sugar and less high-sugar foods. Your caregiver can help you with this.   Adding 2-4 grams of fish oil (EPA+ DHA) may also help lower triglycerides. Speak with your caregiver before adding any supplements to your regimen.  Following the Diet  Maintain your ideal weight. Your caregivers can help you with a diet. Generally,   eating less food and getting more exercise will help you lose weight. Joining a weight control group may also help. Ask your caregivers for a good weight control group in your area.  Eat low-fat foods instead of high-fat foods. This can help you lose weight too.  These foods are lower in fat. Eat MORE  of these:   Dried beans, peas, and lentils.   Egg whites.   Low-fat cottage cheese.   Fish.   Lean cuts of meat, such as round, sirloin, rump, and flank (cut extra fat off meat you fix).   Whole grain breads, cereals and pasta.   Skim and nonfat dry milk.   Low-fat yogurt.   Poultry without the skin.   Cheese made with skim or part-skim milk, such as mozzarella, parmesan, farmers', ricotta, or pot cheese.  These are higher fat foods. Eat LESS of these:   Whole milk and foods made from whole milk, such as American, blue, cheddar, monterey jack, and swiss cheese   High-fat meats, such as luncheon meats, sausages, knockwurst, bratwurst, hot dogs, ribs, corned beef, ground pork, and regular ground beef.   Fried foods.  Limit saturated fats in your diet. Substituting unsaturated fat for saturated fat may decrease your blood triglyceride level. You will need to read package labels to know which products contain saturated fats.  These foods are high in saturated fat. Eat LESS of these:   Fried pork skins.   Whole milk.   Skin and fat from poultry.   Palm oil.   Butter.   Shortening.   Cream cheese.   Bacon.   Margarines and baked goods made from listed oils.   Vegetable shortenings.   Chitterlings.   Fat from meats.   Coconut oil.   Palm kernel oil.   Lard.   Cream.   Sour cream.   Fatback.   Coffee whiteners and non-dairy creamers made with these oils.   Cheese made from whole milk.  Use unsaturated fats (both polyunsaturated and monounsaturated) moderately. Remember, even though unsaturated fats are better than saturated fats; you still want a diet low in total fat.  These foods are high in unsaturated fat:   Canola oil.   Sunflower oil.   Mayonnaise.   Almonds.   Peanuts.   Pine nuts.   Margarines made with these oils.   Safflower oil.   Olive oil.   Avocados.   Cashews.   Peanut butter.   Sunflower seeds.   Soybean oil.     Peanut oil.   Olives.   Pecans.   Walnuts.   Pumpkin seeds.  Avoid sugar and other high-sugar foods. This will decrease carbohydrates without decreasing other nutrients. Sugar in your food goes rapidly to your blood. When there is excess sugar in your blood, your liver may use it to make more triglycerides. Sugar also contains calories without other important nutrients.  Eat LESS of these:   Sugar, brown sugar, powdered sugar, jam, jelly, preserves, honey, syrup, molasses, pies, candy, cakes, cookies, frosting, pastries, colas, soft drinks, punches, fruit drinks, and regular gelatin.   Avoid alcohol. Alcohol, even more than sugar, may increase blood triglycerides. In addition, alcohol is high in calories and low in nutrients. Ask for sparkling water, or a diet soft drink instead of an alcoholic beverage.  Suggestions for planning and preparing meals   Bake, broil, grill or roast meats instead of frying.   Remove fat from meats and skin from poultry before cooking.   Add spices,   herbs, lemon juice or vinegar to vegetables instead of salt, rich sauces or gravies.   Use a non-stick skillet without fat or use no-stick sprays.   Cool and refrigerate stews and broth. Then remove the hardened fat floating on the surface before serving.   Refrigerate meat drippings and skim off fat to make low-fat gravies.   Serve more fish.   Use less butter, margarine and other high-fat spreads on bread or vegetables.   Use skim or reconstituted non-fat dry milk for cooking.   Cook with low-fat cheeses.   Substitute low-fat yogurt or cottage cheese for all or part of the sour cream in recipes for sauces, dips or congealed salads.   Use half yogurt/half mayonnaise in salad recipes.   Substitute evaporated skim milk for cream. Evaporated skim milk or reconstituted non-fat dry milk can be whipped and substituted for whipped cream in certain recipes.   Choose fresh fruits for dessert instead of  high-fat foods such as pies or cakes. Fruits are naturally low in fat.  When Dining Out   Order low-fat appetizers such as fruit or vegetable juice, pasta with vegetables or tomato sauce.   Select clear, rather than cream soups.   Ask that dressings and gravies be served on the side. Then use less of them.   Order foods that are baked, broiled, poached, steamed, stir-fried, or roasted.   Ask for margarine instead of butter, and use only a small amount.   Drink sparkling water, unsweetened tea or coffee, or diet soft drinks instead of alcohol or other sweet beverages.  QUESTIONS AND ANSWERS ABOUT OTHER FATS IN THE BLOOD: SATURATED FAT, TRANS FAT, AND CHOLESTEROL What is trans fat? Trans fat is a type of fat that is formed when vegetable oil is hardened through a process called hydrogenation. This process helps makes foods more solid, gives them shape, and prolongs their shelf life. Trans fats are also called hydrogenated or partially hydrogenated oils.  What do saturated fat, trans fat, and cholesterol in foods have to do with heart disease? Saturated fat, trans fat, and cholesterol in the diet all raise the level of LDL "bad" cholesterol in the blood. The higher the LDL cholesterol, the greater the risk for coronary heart disease (CHD). Saturated fat and trans fat raise LDL similarly.  What foods contain saturated fat, trans fat, and cholesterol? High amounts of saturated fat are found in animal products, such as fatty cuts of meat, chicken skin, and full-fat dairy products like butter, whole milk, cream, and cheese, and in tropical vegetable oils such as palm, palm kernel, and coconut oil. Trans fat is found in some of the same foods as saturated fat, such as vegetable shortening, some margarines (especially hard or stick margarine), crackers, cookies, baked goods, fried foods, salad dressings, and other processed foods made with partially hydrogenated vegetable oils. Small amounts of trans fat  also occur naturally in some animal products, such as milk products, beef, and lamb. Foods high in cholesterol include liver, other organ meats, egg yolks, shrimp, and full-fat dairy products. How can I use the new food label to make heart-healthy food choices? Check the Nutrition Facts panel of the food label. Choose foods lower in saturated fat, trans fat, and cholesterol. For saturated fat and cholesterol, you can also use the Percent Daily Value (%DV): 5% DV or less is low, and 20% DV or more is high. (There is no %DV for trans fat.) Use the Nutrition Facts panel to choose foods low in   saturated fat and cholesterol, and if the trans fat is not listed, read the ingredients and limit products that list shortening or hydrogenated or partially hydrogenated vegetable oil, which tend to be high in trans fat. POINTS TO REMEMBER: YOU NEED A LITTLE TLC (THERAPEUTIC LIFESTYLE CHANGES)  Discuss your risk for heart disease with your caregivers, and take steps to reduce risk factors.   Change your diet. Choose foods that are low in saturated fat, trans fat, and cholesterol.   Add exercise to your daily routine if it is not already being done. Participate in physical activity of moderate intensity, like brisk walking, for at least 30 minutes on most, and preferably all days of the week. No time? Break the 30 minutes into three, 10-minute segments during the day.   Stop smoking. If you do smoke, contact your caregiver to discuss ways in which they can help you quit.   Do not use street drugs.   Maintain a normal weight.   Maintain a healthy blood pressure.   Keep up with your blood work for checking the fats in your blood as directed by your caregiver.  Document Released: 03/09/2004 Document Revised: 05/11/2011 Document Reviewed: 10/05/2008 [employer] Patient Information 2012 [employer].Back Pain, Adult Low back pain is very common. About 1 in 5 people have back pain.The cause of low back pain is  rarely dangerous. The pain often gets better over time.About half of people with a sudden onset of back pain feel better in just 2 weeks. About 8 in 10 people feel better by 6 weeks.  CAUSES Some common causes of back pain include:  Strain of the muscles or ligaments supporting the spine.   Wear and tear (degeneration) of the spinal discs.   Arthritis.   Direct injury to the back.  DIAGNOSIS Most of the time, the direct cause of low back pain is not known.However, back pain can be treated effectively even when the exact cause of the pain is unknown.Answering your caregiver's questions about your overall health and symptoms is one of the most accurate ways to make sure the cause of your pain is not dangerous. If your caregiver needs more information, he or she may order lab work or imaging tests (X-rays or MRIs).However, even if imaging tests show changes in your back, this usually does not require surgery. HOME CARE INSTRUCTIONS For many people, back pain returns.Since low back pain is rarely dangerous, it is often a condition that people can learn to manageon their own.   Remain active. It is stressful on the back to sit or stand in one place. Do not sit, drive, or stand in one place for more than 30 minutes at a time. Take short walks on level surfaces as soon as pain allows.Try to increase the length of time you walk each day.   Do not stay in bed.Resting more than 1 or 2 days can delay your recovery.   Do not avoid exercise or work.Your body is made to move.It is not dangerous to be active, even though your back may hurt.Your back will likely heal faster if you return to being active before your pain is gone.   Pay attention to your body when you bend and lift. Many people have less discomfortwhen lifting if they bend their knees, keep the load close to their bodies,and avoid twisting. Often, the most comfortable positions are those that put less stress on your recovering  back.   Find a comfortable position to sleep. Use a firm   mattress and lie on your side with your knees slightly bent. If you lie on your back, put a pillow under your knees.   Only take over-the-counter or prescription medicines as directed by your caregiver. Over-the-counter medicines to reduce pain and inflammation are often the most helpful.Your caregiver may prescribe muscle relaxant drugs.These medicines help dull your pain so you can more quickly return to your normal activities and healthy exercise.   Put ice on the injured area.   Put ice in a plastic bag.   Place a towel between your skin and the bag.   Leave the ice on for 15 to 20 minutes, 3 to 4 times a day for the first 2 to 3 days. After that, ice and heat may be alternated to reduce pain and spasms.   Ask your caregiver about trying back exercises and gentle massage. This may be of some benefit.   Avoid feeling anxious or stressed.Stress increases muscle tension and can worsen back pain.It is important to recognize when you are anxious or stressed and learn ways to manage it.Exercise is a great option.  SEEK MEDICAL CARE IF:  You have pain that is not relieved with rest or medicine.   You have pain that does not improve in 1 week.   You have new symptoms.   You are generally not feeling well.  SEEK IMMEDIATE MEDICAL CARE IF:   You have pain that radiates from your back into your legs.   You develop new bowel or bladder control problems.   You have unusual weakness or numbness in your arms or legs.   You develop nausea or vomiting.   You develop abdominal pain.   You feel faint.  Document Released: 05/22/2005 Document Revised: 05/11/2011 Document Reviewed: 10/10/2010 [employer] Patient Information 2012 [employer]. 

## 2012-01-26 NOTE — Assessment & Plan Note (Signed)
He is doing well on his current med for pain relief

## 2012-03-18 ENCOUNTER — Encounter: Payer: Self-pay | Admitting: Internal Medicine

## 2012-03-18 ENCOUNTER — Ambulatory Visit (INDEPENDENT_AMBULATORY_CARE_PROVIDER_SITE_OTHER): Payer: BC Managed Care – PPO | Admitting: Internal Medicine

## 2012-03-18 ENCOUNTER — Ambulatory Visit (INDEPENDENT_AMBULATORY_CARE_PROVIDER_SITE_OTHER)
Admission: RE | Admit: 2012-03-18 | Discharge: 2012-03-18 | Disposition: A | Discharge status: Home / Self Care | Payer: BC Managed Care – PPO | Source: Ambulatory Visit | Attending: Internal Medicine | Admitting: Internal Medicine

## 2012-03-18 VITALS — BP 130/80 | HR 61 | Temp 97.4°F | Resp 16 | Ht 69.0 in | Wt 229.0 lb

## 2012-03-18 DIAGNOSIS — M542 Cervicalgia: Secondary | ICD-10-CM

## 2012-03-18 DIAGNOSIS — M79642 Pain in left hand: Secondary | ICD-10-CM

## 2012-03-18 DIAGNOSIS — M79609 Pain in unspecified limb: Secondary | ICD-10-CM

## 2012-03-18 MED ORDER — IBUPROFEN-FAMOTIDINE 800-26.6 MG PO TABS: 1.0000 | ORAL_TABLET | Freq: Three times a day (TID) | ORAL | Status: DC

## 2012-03-18 NOTE — Assessment & Plan Note (Signed)
I will check plain films to see if there is ddd, bone spurs,etc. He will try nsaids for the pain.

## 2012-03-18 NOTE — Patient Instructions (Signed)
Hand Injuries  Minor fractures, sprains, bruises and burns of the hand are often managed in much the same way:   Elevation of your hand above the level of your heart for a few days after the injury, until the pain and swelling improve.   The use of hand dressings and splints to reduce motion, relieve painand prevent reinjury. The dressing and splint should not be removed without the caregiver's approval.   Application of ice packs for 20 to 30 minutes every few hours for 2 to 3 days to reduce pain and swelling due to fractures, sprains, and deep bruises.   The use of medicine to reduce pain and inflammation.  Early motion exercises are sometimes needed to reduce joint stiffness after a hand injury. However, your hand should not be used for any activities that increase pain.  Document Released: 06/29/2004 Document Revised: 08/14/2011 Document Reviewed: 08/21/2008  [employer] Patient Information 2013 [employer]

## 2012-03-18 NOTE — Progress Notes (Signed)
Subjective:    Patient ID: Brandon Lane, male    DOB: [birth year], 46 y.o.   MRN: [id number]  Neck Pain  This is a new problem. The current episode started in the past 7 days. The problem occurs intermittently. The problem has been unchanged. The pain is associated with a twisting injury. The pain is present in the left side. The quality of the pain is described as shooting and stabbing. The pain is at a severity of 2/10. The pain is mild. The symptoms are aggravated by twisting. The pain is same all the time. Stiffness is present all day. Pertinent negatives include no chest pain, fever, headaches, leg pain, numbness, pain with swallowing, paresis, photophobia, syncope, tingling, trouble swallowing, visual change, weakness or weight loss. He has tried oral narcotics for the symptoms. The treatment provided moderate relief.  Hand Injury  The incident occurred 3 to 5 days ago. The incident occurred at home. The injury mechanism was repetitive motion. The pain is present in the left hand. The quality of the pain is described as aching. The pain does not radiate. The pain is at a severity of 2/10. The pain is mild. The pain has been constant since the incident. Pertinent negatives include no chest pain, muscle weakness, numbness or tingling. Treatments tried: percocet. The treatment provided moderate relief.      Review of Systems  Constitutional: Negative.  Negative for fever and weight loss.  HENT: Positive for neck pain and neck stiffness. Negative for trouble swallowing and voice change.   Eyes: Negative.  Negative for photophobia.  Respiratory: Negative.   Cardiovascular: Negative for chest pain, palpitations, leg swelling and syncope.  Gastrointestinal: Negative.   Genitourinary: Negative.   Musculoskeletal: Positive for arthralgias (left hand). Negative for myalgias, back pain, joint swelling and gait problem.  Neurological: Negative for dizziness, tingling, tremors, seizures, syncope,  facial asymmetry, speech difficulty, weakness, light-headedness, numbness and headaches.  Hematological: Negative for adenopathy. Does not bruise/bleed easily.  Psychiatric/Behavioral: Negative.        Objective:   Physical Exam  Vitals reviewed. Constitutional: He is oriented to person, place, and time. He appears well-developed and well-nourished. No distress.  HENT:  Head: Normocephalic and atraumatic.  Mouth/Throat: Oropharynx is clear and moist. No oropharyngeal exudate.  Eyes: Conjunctivae normal are normal. Right eye exhibits no discharge. Left eye exhibits no discharge. No scleral icterus.  Neck: Normal range of motion. Neck supple. No JVD present. No tracheal deviation present. No thyromegaly present.  Cardiovascular: Normal rate, regular rhythm, normal heart sounds and intact distal pulses.  Exam reveals no gallop and no friction rub.   No murmur heard. Pulmonary/Chest: Effort normal and breath sounds normal. No stridor. No respiratory distress. He has no wheezes. He has no rales. He exhibits no tenderness.  Abdominal: Soft. Bowel sounds are normal. He exhibits no distension. There is no tenderness. There is no rebound and no guarding.  Musculoskeletal: Normal range of motion. He exhibits no edema and no tenderness.       Cervical back: Normal. He exhibits normal range of motion, no tenderness, no bony tenderness, no swelling, no edema, no deformity, no laceration, no pain, no spasm and normal pulse.       Left hand: He exhibits deformity and swelling. He exhibits normal range of motion, no tenderness, no bony tenderness, normal two-point discrimination, normal capillary refill and no laceration. normal sensation noted. Normal strength noted.       Hands: Lymphadenopathy:    He has no cervical  adenopathy.  Neurological: He is alert and oriented to person, place, and time. He has normal strength and normal reflexes. He displays no atrophy, no tremor and normal reflexes. No cranial  nerve deficit or sensory deficit. He exhibits normal muscle tone. He displays a negative Romberg sign. He displays no seizure activity. Coordination and gait normal. He displays no Babinski's sign on the right side. He displays no Babinski's sign on the left side.  Reflex Scores:      Tricep reflexes are 2+ on the right side and 2+ on the left side.      Bicep reflexes are 2+ on the right side and 2+ on the left side.      Brachioradialis reflexes are 2+ on the right side and 2+ on the left side.      Patellar reflexes are 2+ on the right side and 2+ on the left side.      Achilles reflexes are 2+ on the right side and 2+ on the left side. Skin: Skin is warm and dry. No rash noted. He is not diaphoretic. There is erythema. No pallor.  Psychiatric: He has a normal mood and affect. His behavior is normal. Judgment and thought content normal.          Assessment & Plan:

## 2012-03-18 NOTE — Assessment & Plan Note (Signed)
I think he has a contusion, I will check a plain film to see if there is a fracture, he will rest/ice/elevate and will try nsaids

## 2012-03-22 ENCOUNTER — Telehealth: Payer: Self-pay | Admitting: Internal Medicine

## 2012-03-22 NOTE — Telephone Encounter (Signed)
normal

## 2012-03-22 NOTE — Telephone Encounter (Signed)
Requesting results of hand/neck xray

## 2012-03-25 DIAGNOSIS — Z0279 Encounter for issue of other medical certificate: Secondary | ICD-10-CM

## 2012-04-08 ENCOUNTER — Encounter: Payer: Self-pay | Admitting: Internal Medicine

## 2012-04-08 ENCOUNTER — Ambulatory Visit (INDEPENDENT_AMBULATORY_CARE_PROVIDER_SITE_OTHER): Payer: BC Managed Care – PPO | Admitting: Internal Medicine

## 2012-04-08 VITALS — BP 124/84 | HR 73 | Temp 96.8°F | Resp 16 | Ht 69.0 in | Wt 229.0 lb

## 2012-04-08 DIAGNOSIS — F5221 Male erectile disorder: Secondary | ICD-10-CM

## 2012-04-08 DIAGNOSIS — M5137 Other intervertebral disc degeneration, lumbosacral region: Secondary | ICD-10-CM

## 2012-04-08 DIAGNOSIS — M5136 Other intervertebral disc degeneration, lumbar region: Secondary | ICD-10-CM

## 2012-04-08 DIAGNOSIS — M545 Low back pain: Secondary | ICD-10-CM

## 2012-04-08 DIAGNOSIS — F329 Major depressive disorder, single episode, unspecified: Secondary | ICD-10-CM

## 2012-04-08 DIAGNOSIS — F528 Other sexual dysfunction not due to a substance or known physiological condition: Secondary | ICD-10-CM

## 2012-04-08 MED ORDER — DULOXETINE HCL 60 MG PO CPEP: 60.0000 mg | ORAL_CAPSULE | Freq: Every day | ORAL | Status: DC

## 2012-04-08 MED ORDER — SILDENAFIL CITRATE 100 MG PO TABS: 100.0000 mg | ORAL_TABLET | ORAL | Status: DC | PRN

## 2012-04-08 MED ORDER — IBUPROFEN 600 MG PO TABS: 600.0000 mg | ORAL_TABLET | Freq: Three times a day (TID) | ORAL | Status: DC | PRN

## 2012-04-08 MED ORDER — OXYCODONE-ACETAMINOPHEN 10-325 MG PO TABS: 1.0000 | ORAL_TABLET | Freq: Three times a day (TID) | ORAL | Status: DC | PRN

## 2012-04-08 NOTE — Assessment & Plan Note (Signed)
He will continue his current meds for pain relief

## 2012-04-08 NOTE — Progress Notes (Signed)
Subjective:    Patient ID: Brandon Lane, male    DOB: January 29, 1966, 46 y.o.   MRN: [id number]  Back Pain This is a chronic problem. The current episode started more than 1 year ago. The problem occurs intermittently. The problem is unchanged. The pain is present in the lumbar spine. The quality of the pain is described as aching. The pain does not radiate. The pain is at a severity of 4/10. The pain is mild. The pain is worse during the day. The symptoms are aggravated by bending and standing. Pertinent negatives include no abdominal pain, bladder incontinence, bowel incontinence, chest pain, dysuria, fever, headaches, leg pain, numbness, paresis, paresthesias, pelvic pain, perianal numbness, tingling, weakness or weight loss. Risk factors include obesity and lack of exercise. He has tried NSAIDs and analgesics for the symptoms. The treatment provided significant relief.      Review of Systems  Constitutional: Negative.  Negative for fever and weight loss.  HENT: Negative.   Eyes: Negative.   Respiratory: Negative for cough, chest tightness, wheezing and stridor.   Cardiovascular: Negative for chest pain, palpitations and leg swelling.  Gastrointestinal: Negative for nausea, vomiting, abdominal pain, diarrhea, constipation and bowel incontinence.  Genitourinary: Negative.  Negative for bladder incontinence, dysuria and pelvic pain.  Musculoskeletal: Positive for back pain. Negative for myalgias, joint swelling, arthralgias and gait problem.  Skin: Negative.   Neurological: Negative.  Negative for tingling, weakness, numbness, headaches and paresthesias.  Hematological: Negative for adenopathy. Does not bruise/bleed easily.  Psychiatric/Behavioral: Negative for suicidal ideas, hallucinations, behavioral problems, confusion, sleep disturbance, self-injury, dysphoric mood, decreased concentration and agitation. The patient is not nervous/anxious and is not hyperactive.        Objective:   Physical Exam  Vitals reviewed. Constitutional: He is oriented to person, place, and time. He appears well-developed and well-nourished. No distress.  HENT:  Head: Normocephalic and atraumatic.  Mouth/Throat: Oropharynx is clear and moist. No oropharyngeal exudate.  Eyes: Conjunctivae normal are normal. Right eye exhibits no discharge. Left eye exhibits no discharge. No scleral icterus.  Neck: Normal range of motion. Neck supple. No JVD present. No tracheal deviation present. No thyromegaly present.  Cardiovascular: Normal rate, regular rhythm, normal heart sounds and intact distal pulses.  Exam reveals no gallop and no friction rub.   No murmur heard. Pulmonary/Chest: Effort normal and breath sounds normal. No stridor. No respiratory distress. He has no wheezes. He has no rales. He exhibits no tenderness.  Abdominal: Soft. Bowel sounds are normal. He exhibits no distension and no mass. There is no tenderness. There is no rebound and no guarding.  Musculoskeletal: Normal range of motion. He exhibits no edema.  Lymphadenopathy:    He has no cervical adenopathy.  Neurological: He is oriented to person, place, and time.  Skin: Skin is warm and dry. No rash noted. He is not diaphoretic. No erythema. No pallor.  Psychiatric: He has a normal mood and affect. His behavior is normal. Judgment and thought content normal.      Lab Results  Component Value Date   WBC 7.1 09/21/2010   HGB 14.9 09/21/2010   HCT 42.6 09/21/2010   PLT 165.0 09/21/2010   GLUCOSE 89 10/31/2011   CHOL 147 10/31/2011   TRIG 81.0 10/31/2011   HDL 47.00 10/31/2011   LDLDIRECT 97.3 09/21/2010   LDLCALC 84 10/31/2011   ALT 23 10/31/2011   AST 22 10/31/2011   NA 140 10/31/2011   K 4.8 10/31/2011   CL 104 10/31/2011  CREATININE 0.9 10/31/2011   BUN 14 10/31/2011   CO2 31 10/31/2011   TSH 2.37 09/21/2010      Assessment & Plan:

## 2012-04-08 NOTE — Assessment & Plan Note (Signed)
Continue viagra as needed 

## 2012-04-08 NOTE — Assessment & Plan Note (Signed)
He is doing well on cymbalta 

## 2012-04-08 NOTE — Assessment & Plan Note (Signed)
Continue current meds for pain relief

## 2012-04-08 NOTE — Patient Instructions (Signed)
Back Pain, Adult Low back pain is very common. About 1 in 5 people have back pain.The cause of low back pain is rarely dangerous. The pain often gets better over time.About half of people with a sudden onset of back pain feel better in just 2 weeks. About 8 in 10 people feel better by 6 weeks.  CAUSES Some common causes of back pain include:  Strain of the muscles or ligaments supporting the spine.  Wear and tear (degeneration) of the spinal discs.  Arthritis.  Direct injury to the back. DIAGNOSIS Most of the time, the direct cause of low back pain is not known.However, back pain can be treated effectively even when the exact cause of the pain is unknown.Answering your caregiver's questions about your overall health and symptoms is one of the most accurate ways to make sure the cause of your pain is not dangerous. If your caregiver needs more information, he or she may order lab work or imaging tests (X-rays or MRIs).However, even if imaging tests show changes in your back, this usually does not require surgery. HOME CARE INSTRUCTIONS For many people, back pain returns.Since low back pain is rarely dangerous, it is often a condition that people can learn to manageon their own.   Remain active. It is stressful on the back to sit or stand in one place. Do not sit, drive, or stand in one place for more than 30 minutes at a time. Take short walks on level surfaces as soon as pain allows.Try to increase the length of time you walk each day.  Do not stay in bed.Resting more than 1 or 2 days can delay your recovery.  Do not avoid exercise or work.Your body is made to move.It is not dangerous to be active, even though your back may hurt.Your back will likely heal faster if you return to being active before your pain is gone.  Pay attention to your body when you bend and lift. Many people have less discomfortwhen lifting if they bend their knees, keep the load close to their bodies,and  avoid twisting. Often, the most comfortable positions are those that put less stress on your recovering back.  Find a comfortable position to sleep. Use a firm mattress and lie on your side with your knees slightly bent. If you lie on your back, put a pillow under your knees.  Only take over-the-counter or prescription medicines as directed by your caregiver. Over-the-counter medicines to reduce pain and inflammation are often the most helpful.Your caregiver may prescribe muscle relaxant drugs.These medicines help dull your pain so you can more quickly return to your normal activities and healthy exercise.  Put ice on the injured area.  Put ice in a plastic bag.  Place a towel between your skin and the bag.  Leave the ice on for 15 to 20 minutes, 3 to 4 times a day for the first 2 to 3 days. After that, ice and heat may be alternated to reduce pain and spasms.  Ask your caregiver about trying back exercises and gentle massage. This may be of some benefit.  Avoid feeling anxious or stressed.Stress increases muscle tension and can worsen back pain.It is important to recognize when you are anxious or stressed and learn ways to manage it.Exercise is a great option. SEEK MEDICAL CARE IF:  You have pain that is not relieved with rest or medicine.  You have pain that does not improve in 1 week.  You have new symptoms.  You are generally   not feeling well. SEEK IMMEDIATE MEDICAL CARE IF:   You have pain that radiates from your back into your legs.  You develop new bowel or bladder control problems.  You have unusual weakness or numbness in your arms or legs.  You develop nausea or vomiting.  You develop abdominal pain.  You feel faint. Document Released: 05/22/2005 Document Revised: 11/21/2011 Document Reviewed: 10/10/2010 [employer] Patient Information 2013 [employer].  

## 2012-04-25 ENCOUNTER — Ambulatory Visit: Payer: BC Managed Care – PPO | Admitting: Internal Medicine

## 2012-05-15 ENCOUNTER — Telehealth: Payer: Self-pay

## 2012-05-15 NOTE — Telephone Encounter (Signed)
I only prescribe this during the course of an OV, no call in refills on percocet 

## 2012-05-15 NOTE — Telephone Encounter (Signed)
Pharmacy notified.

## 2012-05-15 NOTE — Telephone Encounter (Signed)
Received fax from pharmacy, pt requesting refill for oxy/apap 10/325 mg #75. Please advise if ok to refill Thanks  

## 2012-05-17 ENCOUNTER — Ambulatory Visit: Payer: BC Managed Care – PPO | Admitting: Internal Medicine

## 2012-06-03 ENCOUNTER — Ambulatory Visit (INDEPENDENT_AMBULATORY_CARE_PROVIDER_SITE_OTHER): Payer: BC Managed Care – PPO | Admitting: Internal Medicine

## 2012-06-03 ENCOUNTER — Other Ambulatory Visit (INDEPENDENT_AMBULATORY_CARE_PROVIDER_SITE_OTHER): Payer: BC Managed Care – PPO

## 2012-06-03 ENCOUNTER — Encounter: Payer: Self-pay | Admitting: Internal Medicine

## 2012-06-03 VITALS — BP 110/68 | HR 74 | Temp 97.6°F | Resp 16 | Wt 233.8 lb

## 2012-06-03 DIAGNOSIS — M5137 Other intervertebral disc degeneration, lumbosacral region: Secondary | ICD-10-CM

## 2012-06-03 DIAGNOSIS — M545 Low back pain: Secondary | ICD-10-CM

## 2012-06-03 DIAGNOSIS — M5136 Other intervertebral disc degeneration, lumbar region: Secondary | ICD-10-CM

## 2012-06-03 DIAGNOSIS — E781 Pure hyperglyceridemia: Secondary | ICD-10-CM

## 2012-06-03 LAB — LIPID PANEL
Cholesterol: 177 mg/dL (ref 0–200)
HDL: 40.4 mg/dL (ref >39.00)
Triglycerides: 184 mg/dL — ABNORMAL HIGH (ref 0.0–149.0)
VLDL: 36.8 mg/dL (ref 0.0–40.0)

## 2012-06-03 MED ORDER — OXYCODONE-ACETAMINOPHEN 10-325 MG PO TABS: 1.0000 | ORAL_TABLET | Freq: Three times a day (TID) | ORAL | Status: DC | PRN

## 2012-06-03 NOTE — Assessment & Plan Note (Signed)
I will check his UDS to see that he is compliant with his current meds He tells me that he gets relief with percocet and motrin I offered to send him to a pain specialist but he refuses to consider that option

## 2012-06-03 NOTE — Progress Notes (Signed)
Subjective:    Patient ID: Brandon Lane, male    DOB: 04/06/1966, 46 y.o.   MRN: [id number]  Back Pain This is a recurrent problem. The current episode started more than 1 year ago. The problem occurs intermittently. The problem is unchanged. The pain is present in the lumbar spine. The quality of the pain is described as aching. The pain does not radiate. The pain is at a severity of 6/10. The pain is moderate. The pain is worse during the day. The symptoms are aggravated by bending and position. Stiffness is present in the morning. Pertinent negatives include no abdominal pain, bladder incontinence, bowel incontinence, chest pain, dysuria, fever, headaches, leg pain, numbness, paresis, paresthesias, pelvic pain, perianal numbness, tingling, weakness or weight loss. Risk factors include obesity and lack of exercise. He has tried NSAIDs and analgesics for the symptoms. The treatment provided moderate relief.  Hyperlipidemia This is a chronic problem. The current episode started more than 1 year ago. Recent lipid tests were reviewed and are variable. Exacerbating diseases include obesity. He has no history of chronic renal disease, diabetes, hypothyroidism, liver disease or nephrotic syndrome. Factors aggravating his hyperlipidemia include beta blockers. Pertinent negatives include no chest pain, focal sensory loss, focal weakness, leg pain, myalgias or shortness of breath. He is currently on no antihyperlipidemic treatment. Compliance problems include adherence to exercise and adherence to diet.       Review of Systems  Constitutional: Negative.  Negative for fever and weight loss.  HENT: Negative.   Eyes: Negative.   Respiratory: Negative.  Negative for shortness of breath.   Cardiovascular: Negative for chest pain, palpitations and leg swelling.  Gastrointestinal: Negative.  Negative for abdominal pain and bowel incontinence.  Genitourinary: Negative.  Negative for bladder incontinence,  dysuria and pelvic pain.  Musculoskeletal: Positive for back pain. Negative for myalgias, joint swelling, arthralgias and gait problem.  Skin: Negative.   Neurological: Negative.  Negative for tingling, tremors, focal weakness, weakness, numbness, headaches and paresthesias.  Hematological: Does not bruise/bleed easily.  Psychiatric/Behavioral: Negative.        Objective:   Physical Exam  Vitals reviewed. Constitutional: He is oriented to person, place, and time. He appears well-developed and well-nourished. No distress.  HENT:  Head: Normocephalic and atraumatic.  Mouth/Throat: Oropharynx is clear and moist. No oropharyngeal exudate.  Eyes: Conjunctivae normal are normal. Right eye exhibits no discharge. Left eye exhibits no discharge. No scleral icterus.  Neck: Normal range of motion. Neck supple. No JVD present. No tracheal deviation present. No thyromegaly present.  Cardiovascular: Normal rate, regular rhythm, normal heart sounds and intact distal pulses.  Exam reveals no gallop and no friction rub.   No murmur heard. Pulmonary/Chest: Effort normal and breath sounds normal. No stridor. No respiratory distress. He has no wheezes. He has no rales. He exhibits no tenderness.  Abdominal: Soft. Bowel sounds are normal. He exhibits no distension and no mass. There is no tenderness. There is no rebound and no guarding.  Musculoskeletal: Normal range of motion. He exhibits no edema and no tenderness.  Lymphadenopathy:    He has no cervical adenopathy.  Neurological: He is oriented to person, place, and time. He has normal strength. He displays no atrophy, no tremor and normal reflexes. No cranial nerve deficit or sensory deficit. He exhibits normal muscle tone. He displays a negative Romberg sign. He displays no seizure activity. Coordination and gait normal. He displays no Babinski's sign on the right side. He displays no Babinski's sign on the  left side.  Reflex Scores:      Tricep reflexes  are 1+ on the right side and 1+ on the left side.      Bicep reflexes are 1+ on the right side and 1+ on the left side.      Brachioradialis reflexes are 1+ on the right side and 1+ on the left side.      Patellar reflexes are 1+ on the right side and 1+ on the left side.      Achilles reflexes are 1+ on the right side and 1+ on the left side.      - SLR in BLE  Skin: Skin is warm and dry. No rash noted. He is not diaphoretic. No erythema. No pallor.  Psychiatric: He has a normal mood and affect. His behavior is normal. Judgment and thought content normal.     Lab Results  Component Value Date   WBC 7.1 09/21/2010   HGB 14.9 09/21/2010   HCT 42.6 09/21/2010   PLT 165.0 09/21/2010   GLUCOSE 89 10/31/2011   CHOL 147 10/31/2011   TRIG 81.0 10/31/2011   HDL 47.00 10/31/2011   LDLDIRECT 97.3 09/21/2010   LDLCALC 84 10/31/2011   ALT 23 10/31/2011   AST 22 10/31/2011   NA 140 10/31/2011   K 4.8 10/31/2011   CL 104 10/31/2011   CREATININE 0.9 10/31/2011   BUN 14 10/31/2011   CO2 31 10/31/2011   TSH 2.37 09/21/2010       Assessment & Plan:

## 2012-06-03 NOTE — Assessment & Plan Note (Signed)
F/up FLP today

## 2012-06-03 NOTE — Patient Instructions (Signed)
Back Pain, Adult Low back pain is very common. About 1 in 5 people have back pain.The cause of low back pain is rarely dangerous. The pain often gets better over time.About half of people with a sudden onset of back pain feel better in just 2 weeks. About 8 in 10 people feel better by 6 weeks.  CAUSES Some common causes of back pain include:  Strain of the muscles or ligaments supporting the spine.  Wear and tear (degeneration) of the spinal discs.  Arthritis.  Direct injury to the back. DIAGNOSIS Most of the time, the direct cause of low back pain is not known.However, back pain can be treated effectively even when the exact cause of the pain is unknown.Answering your caregiver's questions about your overall health and symptoms is one of the most accurate ways to make sure the cause of your pain is not dangerous. If your caregiver needs more information, he or she may order lab work or imaging tests (X-rays or MRIs).However, even if imaging tests show changes in your back, this usually does not require surgery. HOME CARE INSTRUCTIONS For many people, back pain returns.Since low back pain is rarely dangerous, it is often a condition that people can learn to manageon their own.   Remain active. It is stressful on the back to sit or stand in one place. Do not sit, drive, or stand in one place for more than 30 minutes at a time. Take short walks on level surfaces as soon as pain allows.Try to increase the length of time you walk each day.  Do not stay in bed.Resting more than 1 or 2 days can delay your recovery.  Do not avoid exercise or work.Your body is made to move.It is not dangerous to be active, even though your back may hurt.Your back will likely heal faster if you return to being active before your pain is gone.  Pay attention to your body when you bend and lift. Many people have less discomfortwhen lifting if they bend their knees, keep the load close to their bodies,and  avoid twisting. Often, the most comfortable positions are those that put less stress on your recovering back.  Find a comfortable position to sleep. Use a firm mattress and lie on your side with your knees slightly bent. If you lie on your back, put a pillow under your knees.  Only take over-the-counter or prescription medicines as directed by your caregiver. Over-the-counter medicines to reduce pain and inflammation are often the most helpful.Your caregiver may prescribe muscle relaxant drugs.These medicines help dull your pain so you can more quickly return to your normal activities and healthy exercise.  Put ice on the injured area.  Put ice in a plastic bag.  Place a towel between your skin and the bag.  Leave the ice on for 15 to 20 minutes, 3 to 4 times a day for the first 2 to 3 days. After that, ice and heat may be alternated to reduce pain and spasms.  Ask your caregiver about trying back exercises and gentle massage. This may be of some benefit.  Avoid feeling anxious or stressed.Stress increases muscle tension and can worsen back pain.It is important to recognize when you are anxious or stressed and learn ways to manage it.Exercise is a great option. SEEK MEDICAL CARE IF:  You have pain that is not relieved with rest or medicine.  You have pain that does not improve in 1 week.  You have new symptoms.  You are generally   not feeling well. SEEK IMMEDIATE MEDICAL CARE IF:   You have pain that radiates from your back into your legs.  You develop new bowel or bladder control problems.  You have unusual weakness or numbness in your arms or legs.  You develop nausea or vomiting.  You develop abdominal pain.  You feel faint. Document Released: 05/22/2005 Document Revised: 11/21/2011 Document Reviewed: 10/10/2010 [employer] Patient Information 2013 [employer].  

## 2012-06-03 NOTE — Assessment & Plan Note (Signed)
Continue current meds for now

## 2012-06-04 LAB — OXYCODONE SCREEN, UA, RFLX CONFIRM

## 2012-06-04 LAB — DRUGS OF ABUSE SCREEN W/O ALC, ROUTINE URINE
Amphetamine Screen, Ur: NEGATIVE
Barbiturate Quant, Ur: NEGATIVE
Benzodiazepines.: NEGATIVE
Cocaine Metabolites: NEGATIVE

## 2012-06-06 LAB — OPIATES/OPIOIDS (LC/MS-MS)
Heroin (6-AM), UR: NEGATIVE ng/mL
Morphine Urine: NEGATIVE ng/mL
Noroxycodone, Ur: 480 ng/mL — ABNORMAL HIGH
Oxycodone, ur: 578 ng/mL — ABNORMAL HIGH
Oxymorphone: NEGATIVE ng/mL

## 2012-06-07 ENCOUNTER — Ambulatory Visit: Payer: BC Managed Care – PPO | Admitting: Internal Medicine

## 2012-07-01 ENCOUNTER — Telehealth: Payer: Self-pay | Admitting: *Deleted

## 2012-07-01 DIAGNOSIS — M5136 Other intervertebral disc degeneration, lumbar region: Secondary | ICD-10-CM

## 2012-07-01 DIAGNOSIS — M545 Low back pain: Secondary | ICD-10-CM

## 2012-07-01 MED ORDER — OXYCODONE-ACETAMINOPHEN 10-325 MG PO TABS: 1.0000 | ORAL_TABLET | Freq: Three times a day (TID) | ORAL | Status: DC | PRN

## 2012-07-01 NOTE — Telephone Encounter (Signed)
Received fax from pharmacy,  pt needs refill Oxy/APAP 10/325 mg tablet , last filled 12/30 75 tablets. Please advise refill.

## 2012-07-01 NOTE — Telephone Encounter (Signed)
Called [facility] at [phone] and spoke to [place] regarding received fax for refill for Percocet. Harrold Donath said disregard if pt's needs refill will let us know. Told him okay.

## 2012-07-01 NOTE — Telephone Encounter (Signed)
done

## 2012-07-05 ENCOUNTER — Other Ambulatory Visit: Payer: Self-pay

## 2012-07-05 DIAGNOSIS — M545 Low back pain: Secondary | ICD-10-CM

## 2012-07-05 DIAGNOSIS — M5136 Other intervertebral disc degeneration, lumbar region: Secondary | ICD-10-CM

## 2012-07-05 MED ORDER — OXYCODONE-ACETAMINOPHEN 10-325 MG PO TABS: 1.0000 | ORAL_TABLET | Freq: Three times a day (TID) | ORAL | Status: DC | PRN

## 2012-08-02 ENCOUNTER — Ambulatory Visit: Payer: BC Managed Care – PPO | Admitting: Internal Medicine

## 2012-08-02 ENCOUNTER — Ambulatory Visit (INDEPENDENT_AMBULATORY_CARE_PROVIDER_SITE_OTHER): Payer: BC Managed Care – PPO | Admitting: Internal Medicine

## 2012-08-02 ENCOUNTER — Encounter: Payer: Self-pay | Admitting: Internal Medicine

## 2012-08-02 VITALS — BP 120/80 | HR 64 | Temp 97.8°F | Resp 16 | Wt 235.0 lb

## 2012-08-02 DIAGNOSIS — M5136 Other intervertebral disc degeneration, lumbar region: Secondary | ICD-10-CM

## 2012-08-02 DIAGNOSIS — M5137 Other intervertebral disc degeneration, lumbosacral region: Secondary | ICD-10-CM

## 2012-08-02 DIAGNOSIS — M545 Low back pain: Secondary | ICD-10-CM

## 2012-08-02 DIAGNOSIS — R0683 Snoring: Secondary | ICD-10-CM | POA: Insufficient documentation

## 2012-08-02 DIAGNOSIS — R0609 Other forms of dyspnea: Secondary | ICD-10-CM

## 2012-08-02 MED ORDER — OXYCODONE-ACETAMINOPHEN 10-325 MG PO TABS: 1.0000 | ORAL_TABLET | Freq: Three times a day (TID) | ORAL | Status: DC | PRN

## 2012-08-02 NOTE — Assessment & Plan Note (Signed)
I think he has sleep apnea, he will see sleep med

## 2012-08-02 NOTE — Assessment & Plan Note (Signed)
Continue his current meds I have asked him to see sleep med

## 2012-08-02 NOTE — Patient Instructions (Signed)
Back Pain, Adult Low back pain is very common. About 1 in 5 people have back pain.The cause of low back pain is rarely dangerous. The pain often gets better over time.About half of people with a sudden onset of back pain feel better in just 2 weeks. About 8 in 10 people feel better by 6 weeks.  CAUSES Some common causes of back pain include:  Strain of the muscles or ligaments supporting the spine.  Wear and tear (degeneration) of the spinal discs.  Arthritis.  Direct injury to the back. DIAGNOSIS Most of the time, the direct cause of low back pain is not known.However, back pain can be treated effectively even when the exact cause of the pain is unknown.Answering your caregiver's questions about your overall health and symptoms is one of the most accurate ways to make sure the cause of your pain is not dangerous. If your caregiver needs more information, he or she may order lab work or imaging tests (X-rays or MRIs).However, even if imaging tests show changes in your back, this usually does not require surgery. HOME CARE INSTRUCTIONS For many people, back pain returns.Since low back pain is rarely dangerous, it is often a condition that people can learn to manageon their own.   Remain active. It is stressful on the back to sit or stand in one place. Do not sit, drive, or stand in one place for more than 30 minutes at a time. Take short walks on level surfaces as soon as pain allows.Try to increase the length of time you walk each day.  Do not stay in bed.Resting more than 1 or 2 days can delay your recovery.  Do not avoid exercise or work.Your body is made to move.It is not dangerous to be active, even though your back may hurt.Your back will likely heal faster if you return to being active before your pain is gone.  Pay attention to your body when you bend and lift. Many people have less discomfortwhen lifting if they bend their knees, keep the load close to their bodies,and  avoid twisting. Often, the most comfortable positions are those that put less stress on your recovering back.  Find a comfortable position to sleep. Use a firm mattress and lie on your side with your knees slightly bent. If you lie on your back, put a pillow under your knees.  Only take over-the-counter or prescription medicines as directed by your caregiver. Over-the-counter medicines to reduce pain and inflammation are often the most helpful.Your caregiver may prescribe muscle relaxant drugs.These medicines help dull your pain so you can more quickly return to your normal activities and healthy exercise.  Put ice on the injured area.  Put ice in a plastic bag.  Place a towel between your skin and the bag.  Leave the ice on for 15 to 20 minutes, 3 to 4 times a day for the first 2 to 3 days. After that, ice and heat may be alternated to reduce pain and spasms.  Ask your caregiver about trying back exercises and gentle massage. This may be of some benefit.  Avoid feeling anxious or stressed.Stress increases muscle tension and can worsen back pain.It is important to recognize when you are anxious or stressed and learn ways to manage it.Exercise is a great option. SEEK MEDICAL CARE IF:  You have pain that is not relieved with rest or medicine.  You have pain that does not improve in 1 week.  You have new symptoms.  You are generally   not feeling well. SEEK IMMEDIATE MEDICAL CARE IF:   You have pain that radiates from your back into your legs.  You develop new bowel or bladder control problems.  You have unusual weakness or numbness in your arms or legs.  You develop nausea or vomiting.  You develop abdominal pain.  You feel faint. Document Released: 05/22/2005 Document Revised: 11/21/2011 Document Reviewed: 10/10/2010 [employer] Patient Information 2013 [employer].  

## 2012-08-02 NOTE — Assessment & Plan Note (Signed)
His pain is worsening I have asked him to see pain management

## 2012-08-02 NOTE — Progress Notes (Signed)
Subjective:    Patient ID: Brandon Lane, male    DOB: August 22, 1965, 47 y.o.   MRN: [id number]  Back Pain This is a chronic problem. The current episode started more than 1 year ago. The problem occurs constantly. The problem has been gradually worsening since onset. The pain is present in the lumbar spine. The quality of the pain is described as aching and stabbing. The pain does not radiate. The pain is at a severity of 5/10. The pain is moderate. The pain is worse during the day. The symptoms are aggravated by bending and position. Stiffness is present in the morning. Pertinent negatives include no abdominal pain, bladder incontinence, bowel incontinence, chest pain, dysuria, fever, headaches, leg pain, numbness, paresis, paresthesias, pelvic pain, perianal numbness, tingling, weakness or weight loss. Risk factors include lack of exercise and obesity. He has tried NSAIDs and analgesics for the symptoms. The treatment provided moderate relief.      Review of Systems  Constitutional: Positive for fatigue and unexpected weight change (weight gain). Negative for fever, chills, weight loss, diaphoresis, activity change and appetite change.  HENT: Negative.   Eyes: Negative.   Respiratory: Positive for apnea (and heavy snoring). Negative for cough, chest tightness, shortness of breath, wheezing and stridor.   Cardiovascular: Negative for chest pain, palpitations and leg swelling.  Gastrointestinal: Negative.  Negative for abdominal pain and bowel incontinence.  Endocrine: Negative.   Genitourinary: Negative.  Negative for bladder incontinence, dysuria and pelvic pain.  Musculoskeletal: Positive for back pain. Negative for myalgias, joint swelling, arthralgias and gait problem.  Skin: Negative.   Allergic/Immunologic: Negative.   Neurological: Negative for dizziness, tingling, tremors, weakness, light-headedness, numbness, headaches and paresthesias.  Hematological: Negative for adenopathy. Does  not bruise/bleed easily.  Psychiatric/Behavioral: Positive for dysphoric mood. Negative for suicidal ideas, hallucinations, behavioral problems, confusion, sleep disturbance, self-injury, decreased concentration and agitation. The patient is not nervous/anxious and is not hyperactive.        Objective:   Physical Exam  Vitals reviewed. Constitutional: He is oriented to person, place, and time. He appears well-developed and well-nourished. No distress.  HENT:  Head: Normocephalic and atraumatic.  Mouth/Throat: Oropharynx is clear and moist. No oropharyngeal exudate.  Eyes: Conjunctivae are normal. Right eye exhibits no discharge. Left eye exhibits no discharge. No scleral icterus.  Neck: Normal range of motion. Neck supple. No JVD present. No tracheal deviation present. No thyromegaly present.  Cardiovascular: Normal rate, regular rhythm, normal heart sounds and intact distal pulses.  Exam reveals no gallop and no friction rub.   No murmur heard. Pulmonary/Chest: Effort normal and breath sounds normal. No stridor. No respiratory distress. He has no wheezes. He has no rales. He exhibits no tenderness.  Abdominal: Soft. Bowel sounds are normal. He exhibits no distension and no mass. There is no tenderness. There is no rebound and no guarding.  Musculoskeletal: Normal range of motion. He exhibits no edema and no tenderness.       Lumbar back: Normal. He exhibits normal range of motion, no tenderness, no bony tenderness, no swelling, no edema and no spasm.  Lymphadenopathy:    He has no cervical adenopathy.  Neurological: He is alert and oriented to person, place, and time. He has normal strength. He displays no atrophy, no tremor and normal reflexes. No cranial nerve deficit or sensory deficit. He exhibits normal muscle tone. He displays a negative Romberg sign. He displays no seizure activity. Coordination and gait normal.  Reflex Scores:      Tricep  reflexes are 1+ on the right side and 1+ on  the left side.      Bicep reflexes are 1+ on the right side and 1+ on the left side.      Brachioradialis reflexes are 1+ on the right side and 1+ on the left side.      Patellar reflexes are 1+ on the right side and 1+ on the left side.      Achilles reflexes are 1+ on the right side and 1+ on the left side. - SLR in BLE  Skin: Skin is warm and dry. No rash noted. He is not diaphoretic. No erythema. No pallor.  Psychiatric: He has a normal mood and affect. His behavior is normal. Judgment and thought content normal.     Lab Results  Component Value Date   WBC 7.1 09/21/2010   HGB 14.9 09/21/2010   HCT 42.6 09/21/2010   PLT 165.0 09/21/2010   GLUCOSE 89 10/31/2011   CHOL 177 06/03/2012   TRIG 184.0* 06/03/2012   HDL 40.40 06/03/2012   LDLDIRECT 97.3 09/21/2010   LDLCALC 100* 06/03/2012   ALT 23 10/31/2011   AST 22 10/31/2011   NA 140 10/31/2011   K 4.8 10/31/2011   CL 104 10/31/2011   CREATININE 0.9 10/31/2011   BUN 14 10/31/2011   CO2 31 10/31/2011   TSH 2.37 09/21/2010       Assessment & Plan:

## 2012-08-15 ENCOUNTER — Institutional Professional Consult (permissible substitution): Payer: BC Managed Care – PPO | Admitting: Pulmonary Disease

## 2012-09-09 ENCOUNTER — Institutional Professional Consult (permissible substitution): Payer: BC Managed Care – PPO | Admitting: Pulmonary Disease

## 2012-10-01 ENCOUNTER — Institutional Professional Consult (permissible substitution): Payer: BC Managed Care – PPO | Admitting: Pulmonary Disease

## 2012-10-25 ENCOUNTER — Encounter: Payer: Self-pay | Admitting: Pulmonary Disease

## 2012-10-25 ENCOUNTER — Ambulatory Visit (INDEPENDENT_AMBULATORY_CARE_PROVIDER_SITE_OTHER): Payer: BC Managed Care – PPO | Admitting: Pulmonary Disease

## 2012-10-25 VITALS — BP 124/82 | HR 66 | Temp 97.8°F | Ht 69.0 in | Wt 242.0 lb

## 2012-10-25 DIAGNOSIS — R0600 Dyspnea, unspecified: Secondary | ICD-10-CM

## 2012-10-25 DIAGNOSIS — G4733 Obstructive sleep apnea (adult) (pediatric): Secondary | ICD-10-CM | POA: Insufficient documentation

## 2012-10-25 DIAGNOSIS — R0609 Other forms of dyspnea: Secondary | ICD-10-CM

## 2012-10-25 NOTE — Assessment & Plan Note (Signed)
Unclear etiology, lung function appears normal. No evidence of cardiac dysfunction, will defer further testing at this time

## 2012-10-25 NOTE — Progress Notes (Signed)
Subjective:    Patient ID: Brandon Lane, male    DOB: [birth year], 47 y.o.   MRN: [id number]  HPI 47 year old ex-smoker referred for evaluation of obstructive sleep apnea. His wife reports that he snores loudly and tosses and turns during sleep. He can sleep to a larger more and still feel tired. Epworth sleepiness score is 19/ 24. He reports excessive daytime somnolence the last 20 years but worse over the last one year. He has been working the third shift for 3 years. Bedtime is anywhere from 8 AM to 6 PM since he works at 7 PM to 7 AM shift for days a week. Sleep latency is about 10 minutes, he is 2-3 awakenings without any post void sleep latency. He does not have the preferential body position, sleeps with 2 pillows, has a lot of leg movements during sleep, wife reports increased snoring on his back. He is out of bed at 6 PM feeling tired with dryness of mouth and occasional headache. He can sleep anywhere and at anytime. His drive to work is only 3 minutes and there have been no driving accidents. He is gained about 50 pounds in the last 4 years. He has degenerative disc disease and takes an occasional Percocet about 1-2 per day. There is no history suggestive of cataplexy, sleep paralysis or parasomnias  His somnolence is preventing him from spending time with his family. He also reports increased dyspnea or on exertion over the last 6 months, denies wheezing, chest pain, paroxysmal nocturnal dyspnea or orthopnea. He quit smoking in 2000 and smoked about a pack per day for 15 years prior to that. Spirometry did not show any evidence of airway obstruction with FEV1 of 96%.  Past Medical History  Diagnosis Date  . Depression   . GERD (gastroesophageal reflux disease)   . DDD (degenerative disc disease), lumbar     No past surgical history on file.  No Known Allergies  History   Social History  . Marital Status: Married    Spouse Name: N/A    Number of Children: 5  . Years of  Education: 10   Occupational History  .     Social History Main Topics  . Smoking status: Former Smoker -- 1.00 packs/day for 20 years    Types: Cigarettes    Quit date: 09/21/1998  . Smokeless tobacco: Never Used  . Alcohol Use: 6.0 oz/week    10 Cans of beer per week  . Drug Use: No  . Sexually Active: Yes   Other Topics Concern  . Not on file   Social History Narrative   Caffienated beverages- Yes   Seat belts use-Yes   Smoke Alarm in home-yes   Guns / fireman- No   Physical abuse- No     Review of Systems  Constitutional: Positive for appetite change and unexpected weight change.  HENT: Positive for congestion, sneezing and dental problem. Negative for ear pain, sore throat and trouble swallowing.   Respiratory: Positive for cough and shortness of breath.   Cardiovascular: Negative for chest pain and palpitations.  Musculoskeletal: Negative for joint swelling.  Skin: Negative for rash.  Neurological: Positive for headaches.  Psychiatric/Behavioral: Positive for dysphoric mood. The patient is not nervous/anxious.        Objective:   Physical Exam  Gen. Pleasant, obese, in no distress, normal affect ENT - no lesions, no post nasal drip, class 2 airway Neck: No JVD, no thyromegaly, no carotid bruits Lungs: no use of accessory muscles,  no dullness to percussion, decreased without rales or rhonchi  Cardiovascular: Rhythm regular, heart sounds  normal, no murmurs or gallops, no peripheral edema Abdomen: soft and non-tender, no hepatosplenomegaly, BS normal. Musculoskeletal: No deformities, no cyanosis or clubbing Neuro:  alert, non focal, no tremors       Assessment & Plan:

## 2012-10-25 NOTE — Patient Instructions (Addendum)
We will set up a sleep study - you likely have obstructive sleep apnea Breathing test shows normal lung function

## 2012-10-25 NOTE — Assessment & Plan Note (Addendum)
Given excessive daytime somnolence, narrow pharyngeal exam, witnessed apneas & loud snoring, obstructive sleep apnea is very likely & an overnight polysomnogram will be scheduled as a split study. The pathophysiology of obstructive sleep apnea , it's cardiovascular consequences & modes of treatment including CPAP were discused with the patient in detail & they evidenced understanding. If sleep disorder breathing is not significant on the study, he may have to pursue multiple sleep latency testing for narcolepsy

## 2012-11-01 ENCOUNTER — Ambulatory Visit (INDEPENDENT_AMBULATORY_CARE_PROVIDER_SITE_OTHER): Payer: BC Managed Care – PPO | Admitting: Internal Medicine

## 2012-11-01 ENCOUNTER — Encounter: Payer: Self-pay | Admitting: Internal Medicine

## 2012-11-01 VITALS — BP 128/86 | HR 66 | Temp 96.7°F | Resp 16 | Wt 239.0 lb

## 2012-11-01 DIAGNOSIS — M51379 Other intervertebral disc degeneration, lumbosacral region without mention of lumbar back pain or lower extremity pain: Secondary | ICD-10-CM

## 2012-11-01 DIAGNOSIS — M5137 Other intervertebral disc degeneration, lumbosacral region: Secondary | ICD-10-CM

## 2012-11-01 DIAGNOSIS — M545 Low back pain, unspecified: Secondary | ICD-10-CM

## 2012-11-01 DIAGNOSIS — M5136 Other intervertebral disc degeneration, lumbar region: Secondary | ICD-10-CM

## 2012-11-01 MED ORDER — OXYCODONE-ACETAMINOPHEN 10-325 MG PO TABS: 1.0000 | ORAL_TABLET | Freq: Three times a day (TID) | ORAL | Status: DC | PRN

## 2012-11-01 NOTE — Progress Notes (Signed)
Subjective:    Patient ID: Brandon Lane, male    DOB: 22-Jul-1965, 47 y.o.   MRN: [id number]  Back Pain This is a chronic problem. The current episode started more than 1 year ago. The problem occurs intermittently. The problem is unchanged. The pain is present in the lumbar spine. The quality of the pain is described as aching. The pain does not radiate. The pain is at a severity of 5/10. The pain is moderate. The pain is worse during the day. The symptoms are aggravated by bending, position and standing. Pertinent negatives include no abdominal pain, bladder incontinence, bowel incontinence, chest pain, dysuria, fever, headaches, leg pain, numbness, paresis, paresthesias, pelvic pain, perianal numbness, tingling, weakness or weight loss. Risk factors include obesity and lack of exercise. He has tried NSAIDs and analgesics for the symptoms. The treatment provided significant relief.      Review of Systems  Constitutional: Negative.  Negative for fever and weight loss.  HENT: Negative.   Eyes: Negative.   Respiratory: Negative.   Cardiovascular: Negative.  Negative for chest pain.  Gastrointestinal: Negative.  Negative for abdominal pain and bowel incontinence.  Endocrine: Negative.   Genitourinary: Negative.  Negative for bladder incontinence, dysuria and pelvic pain.  Musculoskeletal: Positive for back pain. Negative for myalgias, joint swelling, arthralgias and gait problem.  Skin: Negative.   Allergic/Immunologic: Negative.   Neurological: Negative.  Negative for dizziness, tingling, weakness, numbness, headaches and paresthesias.  Hematological: Negative.   Psychiatric/Behavioral: Negative.        Objective:   Physical Exam  Vitals reviewed. Constitutional: He appears well-developed and well-nourished.  Non-toxic appearance. He does not have a sickly appearance. He does not appear ill. No distress.  HENT:  Head: Normocephalic and atraumatic.  Mouth/Throat: Oropharynx is clear  and moist. No oropharyngeal exudate.  Eyes: Conjunctivae are normal. Right eye exhibits no discharge. Left eye exhibits no discharge. No scleral icterus.  Neck: Normal range of motion. Neck supple. No JVD present. No tracheal deviation present. No thyromegaly present.  Cardiovascular: Normal rate, regular rhythm, normal heart sounds and intact distal pulses.  Exam reveals no gallop and no friction rub.   No murmur heard. Pulmonary/Chest: Effort normal. No stridor. No respiratory distress. He has no wheezes. He has no rales. He exhibits no tenderness.  Abdominal: Bowel sounds are normal. He exhibits no distension and no mass. There is no tenderness. There is no rebound and no guarding.  Musculoskeletal: Normal range of motion. He exhibits no edema and no tenderness.       Lumbar back: Normal. He exhibits normal range of motion, no tenderness, no bony tenderness, no swelling, no edema, no deformity, no laceration, no pain, no spasm and normal pulse.  Lymphadenopathy:    He has no cervical adenopathy.  Neurological: He is alert. He has normal strength. He displays no atrophy, no tremor and normal reflexes. No cranial nerve deficit or sensory deficit. He exhibits normal muscle tone. He displays no seizure activity. Coordination and gait normal.  Reflex Scores:      Tricep reflexes are 1+ on the right side and 1+ on the left side.      Bicep reflexes are 1+ on the right side and 1+ on the left side.      Brachioradialis reflexes are 1+ on the right side and 1+ on the left side.      Patellar reflexes are 1+ on the right side and 1+ on the left side.      Achilles reflexes  are 1+ on the right side and 1+ on the left side. Neg SLR in BLE  Skin: He is not diaphoretic.          Assessment & Plan:

## 2012-11-01 NOTE — Assessment & Plan Note (Signed)
He is doing well on his current meds No clinical change noted today

## 2012-11-01 NOTE — Patient Instructions (Signed)
Back Pain, Adult  Low back pain is very common. About 1 in 5 people have back pain. The cause of low back pain is rarely dangerous. The pain often gets better over time. About half of people with a sudden onset of back pain feel better in just 2 weeks. About 8 in 10 people feel better by 6 weeks.   CAUSES  Some common causes of back pain include:  · Strain of the muscles or ligaments supporting the spine.  · Wear and tear (degeneration) of the spinal discs.  · Arthritis.  · Direct injury to the back.  DIAGNOSIS  Most of the time, the direct cause of low back pain is not known. However, back pain can be treated effectively even when the exact cause of the pain is unknown. Answering your caregiver's questions about your overall health and symptoms is one of the most accurate ways to make sure the cause of your pain is not dangerous. If your caregiver needs more information, he or she may order lab work or imaging tests (X-rays or MRIs). However, even if imaging tests show changes in your back, this usually does not require surgery.  HOME CARE INSTRUCTIONS  For many people, back pain returns. Since low back pain is rarely dangerous, it is often a condition that people can learn to manage on their own.   · Remain active. It is stressful on the back to sit or stand in one place. Do not sit, drive, or stand in one place for more than 30 minutes at a time. Take short walks on level surfaces as soon as pain allows. Try to increase the length of time you walk each day.  · Do not stay in bed. Resting more than 1 or 2 days can delay your recovery.  · Do not avoid exercise or work. Your body is made to move. It is not dangerous to be active, even though your back may hurt. Your back will likely heal faster if you return to being active before your pain is gone.  · Pay attention to your body when you  bend and lift. Many people have less discomfort when lifting if they bend their knees, keep the load close to their bodies, and  avoid twisting. Often, the most comfortable positions are those that put less stress on your recovering back.  · Find a comfortable position to sleep. Use a firm mattress and lie on your side with your knees slightly bent. If you lie on your back, put a pillow under your knees.  · Only take over-the-counter or prescription medicines as directed by your caregiver. Over-the-counter medicines to reduce pain and inflammation are often the most helpful. Your caregiver may prescribe muscle relaxant drugs. These medicines help dull your pain so you can more quickly return to your normal activities and healthy exercise.  · Put ice on the injured area.  · Put ice in a plastic bag.  · Place a towel between your skin and the bag.  · Leave the ice on for 15-20 minutes, 3-4 times a day for the first 2 to 3 days. After that, ice and heat may be alternated to reduce pain and spasms.  · Ask your caregiver about trying back exercises and gentle massage. This may be of some benefit.  · Avoid feeling anxious or stressed. Stress increases muscle tension and can worsen back pain. It is important to recognize when you are anxious or stressed and learn ways to manage it. Exercise is a great option.  SEEK MEDICAL CARE IF:  · You have pain that is not relieved with rest or   medicine.  · You have pain that does not improve in 1 week.  · You have new symptoms.  · You are generally not feeling well.  SEEK IMMEDIATE MEDICAL CARE IF:   · You have pain that radiates from your back into your legs.  · You develop new bowel or bladder control problems.  · You have unusual weakness or numbness in your arms or legs.  · You develop nausea or vomiting.  · You develop abdominal pain.  · You feel faint.  Document Released: 05/22/2005 Document Revised: 11/21/2011 Document Reviewed: 10/10/2010  Exit[employer]® Patient Information ©2014 [employer].

## 2012-11-01 NOTE — Assessment & Plan Note (Signed)
No changes noted today He signed a CSC

## 2012-11-21 ENCOUNTER — Encounter (HOSPITAL_BASED_OUTPATIENT_CLINIC_OR_DEPARTMENT_OTHER): Payer: BC Managed Care – PPO

## 2012-11-22 ENCOUNTER — Ambulatory Visit (HOSPITAL_BASED_OUTPATIENT_CLINIC_OR_DEPARTMENT_OTHER): Payer: BC Managed Care – PPO | Attending: Pulmonary Disease

## 2012-11-22 VITALS — Ht 69.0 in | Wt 237.0 lb

## 2012-11-22 DIAGNOSIS — G4733 Obstructive sleep apnea (adult) (pediatric): Secondary | ICD-10-CM | POA: Insufficient documentation

## 2012-11-27 DIAGNOSIS — G4733 Obstructive sleep apnea (adult) (pediatric): Secondary | ICD-10-CM

## 2012-11-27 NOTE — Procedures (Signed)
NAME:  [name], Brandon Lane NO.:  [id number]  MEDICAL RECORD NO.:  [id number]          PATIENT TYPE:  OUT  LOCATION:  SLEEP CENTER                 FACILITY:  [facility]  PHYSICIAN:  Oretha Milch, MD      DATE OF BIRTH:  1965/07/06  DATE OF STUDY:  11/22/2012                           NOCTURNAL POLYSOMNOGRAM  REFERRING PHYSICIAN:  Oretha Milch, MD  INDICATION FOR STUDY:  [name] is a 47 year old gentleman with excessive daytime somnolence and loud snoring with restless sleep.  At the time of this study, he weighed 237 pounds with a height of 5 feet 9 inches, BMI of 35, neck size 15.5 inches.  EPWORTH SLEEPINESS SCORE:  12.  MEDICATIONS:  Percocet and Cymbalta.  This nocturnal polysomnogram was performed with a sleep technologist in attendance.  EEG, EOG, EMG, EKG, and respiratory parameters were recorded.  Sleep stages, arousals, limb movements, and respiratory data were scored according to criteria laid out by the American Academy of Sleep Medicine.  SLEEP ARCHITECTURE:  Lights out was at 9:47 p.m., lights on was at 5 a.m., total sleep time was 353 minutes with a sleep period time of 406 minutes and sleep efficiency of 82%.  Sleep latency was 26 minutes and latency to REM sleep was 155 minutes.  Awake after sleep onset was 53 minutes.  Sleep stages as a percentage of total sleep time was N1 6%, N2 83%, and REM sleep 10%.  No slow wave sleep was noted.  Supine sleep accounted 413 minutes.  Longest period of REM sleep was around 3 a.m.  Arousal Data:  There were 85 arousals with an arousal index of 14 events per hour.  Of these, 59 were spontaneous and the rest were associated with respiratory events.  RESPIRATORY DATA:  There were a total of 22 obstructive apneas, 0 central apnea, 0 mixed apneas, and 30 hypopneas with an apnea-hypopnea index of 9 events per hour.  10 RERAs were noted with an RDI of 10.5 events per hour.  Longest hypopnea was 35 seconds and the  longest apnea was 27 seconds.  OXYGEN DATA:  The desaturation index was 15 events per hour with a lowest desaturation of 82%.  He spent 1.3 minutes with a saturation less than 88%.  CARDIAC DATA:  The low heart rate was 38 beats per minute and the high heart rate recorded was an artifact.  No arrhythmias were noted.  MOVEMENT-PARASOMNIA:  No significant limb movements were noted.  Discussion:  He was desensitized with a medium full-face mask, but did not meet criteria for CPAP intervention.  IMPRESSIONS-RECOMMENDATIONS: 1. Mild obstructive sleep apnea with hypopneas causing sleep     fragmentation and moderate oxygen desaturation. 2. No evidence of cardiac arrhythmias, limb movements, or behavioral     disturbance during sleep.  Most events were noted while supine.  Recommendation: 1. Treatment options for this degree of sleep-disordered breathing     include weight loss, CPAP therapy, or dental appliance. 2. He should be cautioned against driving when sleepy.  He should be     asked to avoid medications with sedative side effects.     Oretha Milch, MD  RVA/MEDQ  D:  11/27/2012 09:47:27  T:  11/27/2012 10:05:03  Job:  [id number]

## 2012-12-02 ENCOUNTER — Telehealth: Payer: Self-pay | Admitting: Pulmonary Disease

## 2012-12-02 DIAGNOSIS — G4733 Obstructive sleep apnea (adult) (pediatric): Secondary | ICD-10-CM

## 2012-12-02 NOTE — Telephone Encounter (Signed)
PSG showed mild obstructive sleep apnea  If he is willing, trial of CPAP 5-15 cm, with a medium full-face mask, humidity, download in 4 wks

## 2012-12-03 NOTE — Telephone Encounter (Signed)
I spoke with patient about results and he verbalized understanding and had no questions. Order sent to [facility]

## 2012-12-16 ENCOUNTER — Encounter: Payer: Self-pay | Admitting: Pulmonary Disease

## 2012-12-16 ENCOUNTER — Ambulatory Visit (INDEPENDENT_AMBULATORY_CARE_PROVIDER_SITE_OTHER): Payer: BC Managed Care – PPO | Admitting: Pulmonary Disease

## 2012-12-16 VITALS — BP 124/70 | HR 64 | Temp 97.7°F | Ht 69.0 in | Wt 240.8 lb

## 2012-12-16 DIAGNOSIS — G4733 Obstructive sleep apnea (adult) (pediatric): Secondary | ICD-10-CM

## 2012-12-16 NOTE — Patient Instructions (Addendum)
trial of Auto CPAP , with a medium full-face mask Call me back in 1 wk to report

## 2012-12-16 NOTE — Assessment & Plan Note (Addendum)
Sleepiness seems to be out of proportion with ESS 12/24 Will undertake trial of autoCPAP 5-15 cm, with a medium full-face mask, humidity, download in 4 wks - he will check with a different DME or can try refurbished model at lower cost If sleepiness persists inspite of CPAP or if unable to tolerate, can consider other causes  Weight loss encouraged, compliance with goal of at least 4-6 hrs every night is the expectation. Advised against medications with sedative side effects Cautioned against driving when sleepy - understanding that sleepiness will vary on a day to day basis

## 2012-12-16 NOTE — Progress Notes (Signed)
  Subjective:    Patient ID: Brandon Lane, male    DOB: 02-08-1966, 47 y.o.   MRN: [id number]  HPI  47 year old ex-smoker  for FU of obstructive sleep apnea.  His wife reports that he snores loudly and tosses and turns during sleep. He can sleep to a larger more and still feel tired.  Epworth sleepiness score is 19/ 24. He reports excessive daytime somnolence the last 20 years but worse over the last one year. He has been working the third shift for 3 years.  Bedtime is anywhere from 8 AM to 6 PM since he works at 7 PM to 7 AM shift for days a week. Sleep latency is about 10 minutes, he is 2-3 awakenings without any post void sleep latency. He does not have the preferential body position, sleeps with 2 pillows, has a lot of leg movements during sleep, wife reports increased snoring on his back. He is out of bed at 6 PM feeling tired with dryness of mouth and occasional headache. He can sleep anywhere and at anytime. His drive to work is only 3 minutes and there have been no driving accidents. He is gained about 50 pounds in the last 4 years.  He has degenerative disc disease and takes an occasional Percocet about 1-2 per day.  His somnolence is preventing him from spending time with his family.  He also reports increased dyspnea or on exertion over the last 6 months, denies wheezing, chest pain, paroxysmal nocturnal dyspnea or orthopnea. He quit smoking in 2000 and smoked about a pack per day for 15 years prior to that.  Spirometry did not show any evidence of airway obstruction with FEV1 of 96%.   12/16/12 PSG showed mild obstructive sleep apnea with RDI 10/h  , latency to REM sleep was 155 minutes Sleepiness seems tobe out of proportion with ESS 12/24  He did not get CPAP since very expensive, would like to try another DME Remains sleepy C/o epigastric pain, wife wonders if this could be cardiac or gastritis   Review of Systems neg for any significant sore throat, dysphagia, itching,  sneezing, nasal congestion or excess/ purulent secretions, fever, chills, sweats, unintended wt loss, pleuritic or exertional cp, hempoptysis, orthopnea pnd or change in chronic leg swelling. Also denies presyncope, palpitations, heartburn, abdominal pain, nausea, vomiting, diarrhea or change in bowel or urinary habits, dysuria,hematuria, rash, arthralgias, visual complaints, headache, numbness weakness or ataxia.     Objective:   Physical Exam  Gen. Pleasant, obese, in no distress ENT - no lesions, no post nasal drip Neck: No JVD, no thyromegaly, no carotid bruits Lungs: no use of accessory muscles, no dullness to percussion, decreased without rales or rhonchi  Cardiovascular: Rhythm regular, heart sounds  normal, no murmurs or gallops, no peripheral edema Musculoskeletal: No deformities, no cyanosis or clubbing , no tremors       Assessment & Plan:

## 2012-12-17 ENCOUNTER — Encounter: Payer: Self-pay | Admitting: Internal Medicine

## 2012-12-17 ENCOUNTER — Ambulatory Visit (INDEPENDENT_AMBULATORY_CARE_PROVIDER_SITE_OTHER): Payer: BC Managed Care – PPO | Admitting: Internal Medicine

## 2012-12-17 VITALS — BP 124/82 | HR 77 | Temp 97.0°F | Resp 16 | Wt 238.0 lb

## 2012-12-17 DIAGNOSIS — M545 Low back pain, unspecified: Secondary | ICD-10-CM

## 2012-12-17 DIAGNOSIS — M5136 Other intervertebral disc degeneration, lumbar region: Secondary | ICD-10-CM

## 2012-12-17 DIAGNOSIS — F329 Major depressive disorder, single episode, unspecified: Secondary | ICD-10-CM

## 2012-12-17 DIAGNOSIS — F32A Depression, unspecified: Secondary | ICD-10-CM

## 2012-12-17 DIAGNOSIS — M51369 Other intervertebral disc degeneration, lumbar region without mention of lumbar back pain or lower extremity pain: Secondary | ICD-10-CM

## 2012-12-17 DIAGNOSIS — M5137 Other intervertebral disc degeneration, lumbosacral region: Secondary | ICD-10-CM

## 2012-12-17 MED ORDER — DEPLIN 15 15-90.314 MG PO CAPS: 1.0000 | ORAL_CAPSULE | Freq: Every day | ORAL | Status: DC

## 2012-12-17 MED ORDER — LEVOMILNACIPRAN HCL ER 40 MG PO CP24: 1.0000 | ORAL_CAPSULE | Freq: Every day | ORAL | Status: DC

## 2012-12-17 MED ORDER — OXYCODONE-ACETAMINOPHEN 10-325 MG PO TABS: 1.0000 | ORAL_TABLET | Freq: Three times a day (TID) | ORAL | Status: DC | PRN

## 2012-12-17 NOTE — Patient Instructions (Signed)

## 2012-12-17 NOTE — Progress Notes (Signed)
Subjective:    Patient ID: Brandon Lane, male    DOB: [birth year], 47 y.o.   MRN: [id number]  Back Pain This is a chronic problem. The current episode started more than 1 year ago. The problem occurs intermittently. The problem is unchanged. The pain is present in the lumbar spine. The quality of the pain is described as aching. The pain does not radiate. The pain is at a severity of 6/10. The pain is moderate. The pain is worse during the day. The symptoms are aggravated by bending, position and standing. Pertinent negatives include no abdominal pain, bladder incontinence, bowel incontinence, chest pain, dysuria, fever, headaches, leg pain, numbness, paresis, paresthesias, pelvic pain, perianal numbness, tingling, weakness or weight loss. Risk factors include obesity, lack of exercise and sedentary lifestyle. He has tried analgesics for the symptoms. The treatment provided significant relief.      Review of Systems  Constitutional: Negative.  Negative for fever, chills, weight loss, diaphoresis, activity change, appetite change, fatigue and unexpected weight change.  Eyes: Negative.   Respiratory: Positive for apnea. Negative for cough, chest tightness, shortness of breath, wheezing and stridor.   Cardiovascular: Negative.  Negative for chest pain, palpitations and leg swelling.  Gastrointestinal: Negative.  Negative for vomiting, abdominal pain, diarrhea, constipation, blood in stool and bowel incontinence.  Endocrine: Negative.   Genitourinary: Negative.  Negative for bladder incontinence, dysuria and pelvic pain.  Musculoskeletal: Positive for back pain. Negative for myalgias, joint swelling, arthralgias and gait problem.  Skin: Negative.   Allergic/Immunologic: Negative.   Neurological: Negative.  Negative for dizziness, tingling, weakness, numbness, headaches and paresthesias.  Hematological: Negative.   Psychiatric/Behavioral: Positive for sleep disturbance and dysphoric mood (sad,  irritable, sleeps too much, does not think cymbalta is helping). Negative for suicidal ideas, hallucinations, behavioral problems, confusion, self-injury, decreased concentration and agitation. The patient is not nervous/anxious and is not hyperactive.        Objective:   Physical Exam  Vitals reviewed. Constitutional: He is oriented to person, place, and time. He appears well-developed and well-nourished. No distress.  HENT:  Head: Normocephalic and atraumatic.  Mouth/Throat: Oropharynx is clear and moist. No oropharyngeal exudate.  Eyes: Conjunctivae are normal. Right eye exhibits no discharge. Left eye exhibits no discharge. No scleral icterus.  Neck: Normal range of motion. Neck supple. No JVD present. No tracheal deviation present. No thyromegaly present.  Cardiovascular: Normal rate, regular rhythm, normal heart sounds and intact distal pulses.  Exam reveals no gallop and no friction rub.   No murmur heard. Pulmonary/Chest: Effort normal and breath sounds normal. No stridor. No respiratory distress. He has no wheezes. He has no rales. He exhibits no tenderness.  Abdominal: Soft. Bowel sounds are normal. He exhibits no distension and no mass. There is no tenderness. There is no rebound and no guarding.  Musculoskeletal: Normal range of motion. He exhibits no edema and no tenderness.       Lumbar back: Normal. He exhibits normal range of motion, no tenderness, no bony tenderness, no swelling, no edema, no deformity, no laceration, no pain, no spasm and normal pulse.  Lymphadenopathy:    He has no cervical adenopathy.  Neurological: He is oriented to person, place, and time. He displays normal reflexes.  Reflex Scores:      Tricep reflexes are 1+ on the right side and 1+ on the left side.      Bicep reflexes are 1+ on the right side and 1+ on the left side.  Brachioradialis reflexes are 1+ on the right side and 1+ on the left side.      Patellar reflexes are 1+ on the right side and  1+ on the left side.      Achilles reflexes are 1+ on the right side and 1+ on the left side. Neg LSR  Skin: Skin is warm and dry. No rash noted. He is not diaphoretic. No erythema. No pallor.  Psychiatric: He has a normal mood and affect. His behavior is normal. Judgment and thought content normal.          Assessment & Plan:

## 2012-12-17 NOTE — Assessment & Plan Note (Signed)
Will continue the current meds for pain 

## 2012-12-17 NOTE — Assessment & Plan Note (Signed)
Stop cymbalta Will try the combo of fetzima and deplin

## 2012-12-23 ENCOUNTER — Telehealth: Payer: Self-pay | Admitting: *Deleted

## 2012-12-23 ENCOUNTER — Telehealth: Payer: Self-pay | Admitting: Internal Medicine

## 2012-12-23 NOTE — Telephone Encounter (Signed)
Patient Information:  Caller Name: Gavin Pound  Phone: [phone]  Patient: Brandon Lane, Brandon Lane  Gender: Male  DOB: 02-18-1966  Age: 47 Years  PCP: Sanda Linger (Adults only)  Office Follow Up:  Does the office need to follow up with this patient?: No  Instructions For The Office: N/A  RN Note:  Spoke with office RN and she advised for him to be seen in ED since it is late in the day and not able to come into office.  Symptoms  Reason For Call & Symptoms: Started on Fetzima starter kit and took 20 mgs on 7/14 and 7/15 and 40mg s on 12/18/12. He stopped taking it because it made his head feel funny and lightheaded. Severe dizziness on 12/20/12 and stayed in bed on 12/21/12.  Today he is having blurred vision, lips feel numb and is still very lightheaded and dizzy-12/23/12,  Reviewed Health History In EMR: Yes  Reviewed Medications In EMR: Yes  Reviewed Allergies In EMR: Yes  Reviewed Surgeries / Procedures: Yes  Date of Onset of Symptoms: 12/20/2012  Guideline(s) Used:  Dizziness  Disposition Per Guideline:   Go to Office Now  Reason For Disposition Reached:   Lightheadedness (dizziness) present now, after 2 hours of rest and fluids  Advice Given:  Drink Fluids:  Drink several glasses of fruit juice, other clear fluids, or water. This will improve hydration and blood glucose. If you have a fever or have had heat exposure, make sure the fluids are cold.  Rest for 1-2 Hours:  Lie down with feet elevated for 1 hour. This will improve blood flow and increase blood flow to the brain.  Stand Up Slowly:  In the mornings, sit up for a few minutes before you stand up. That will help your blood flow make the adjustment.  If you have to stand up for long periods of time, contract and relax your leg muscles to help pump the blood back to the heart.  Sit down or lie down if you feel dizzy.  Call Back If:  Still feel dizzy after 2 hours of rest and fluids  Passes out (faints)  You become  worse.  Patient Refused Recommendation:  Patient Will Go To ED  Late in the day- not able to be seen in the office.

## 2012-12-23 NOTE — Telephone Encounter (Signed)
Call A Nurse called states pt is complaining of light headedness and blurred vision.  Pt advised to go to the ER and to follow up with office afterwards.

## 2012-12-24 ENCOUNTER — Ambulatory Visit (INDEPENDENT_AMBULATORY_CARE_PROVIDER_SITE_OTHER): Payer: BC Managed Care – PPO | Admitting: Internal Medicine

## 2012-12-24 ENCOUNTER — Encounter: Payer: Self-pay | Admitting: Internal Medicine

## 2012-12-24 VITALS — BP 146/90 | HR 62 | Temp 98.1°F | Wt 241.0 lb

## 2012-12-24 DIAGNOSIS — F329 Major depressive disorder, single episode, unspecified: Secondary | ICD-10-CM

## 2012-12-24 DIAGNOSIS — T50905A Adverse effect of unspecified drugs, medicaments and biological substances, initial encounter: Secondary | ICD-10-CM

## 2012-12-24 DIAGNOSIS — F32A Depression, unspecified: Secondary | ICD-10-CM

## 2012-12-24 DIAGNOSIS — T887XXA Unspecified adverse effect of drug or medicament, initial encounter: Secondary | ICD-10-CM

## 2012-12-24 NOTE — Progress Notes (Signed)
Subjective:    Patient ID: Brandon Lane, male    DOB: [birth year], 47 y.o.   MRN: [id number]  HPI  Pt presents to the clinic today with c/o of lightheadedness, blurred vision and numbness around his lips x 3 days. This started shortly after starting Fetzima and [place]in for depression.  He took the medication for 3 days before he started noticing these symptoms. He then stopped taking both the medications. He has tried to increase his fluid intake and rest, however the symptoms still persist. The worse part is the lightheadedness. He has felt this way in the past with the initiation of other medications but it never lasted this long.  Review of Systems      Past Medical History  Diagnosis Date  . Depression   . GERD (gastroesophageal reflux disease)   . DDD (degenerative disc disease), lumbar     Current Outpatient Prescriptions  Medication Sig Dispense Refill  . ibuprofen (ADVIL,MOTRIN) 600 MG tablet Take 1 tablet (600 mg total) by mouth every 8 (eight) hours as needed for pain.  90 tablet  5  . L-Methylfolate-Algae (DEPLIN 15) 15-90.314 MG CAPS Take 1 capsule by mouth daily.  90 capsule  3  . Levomilnacipran HCl ER (FETZIMA) 40 MG CP24 Take 1 capsule by mouth daily.  90 capsule  3  . oxyCODONE-acetaminophen (PERCOCET) 10-325 MG per tablet Take 1 tablet by mouth every 8 (eight) hours as needed for pain. Fill on or after 12/16/12  75 tablet  0   No current facility-administered medications for this visit.    No Known Allergies  Family History  Problem Relation Age of Onset  . Arthritis Mother   . Hyperlipidemia Mother   . Stroke Mother   . Hypertension Mother   . Kidney disease Mother   . Diabetes Mother   . Alcohol abuse Father   . Sudden death Sister   . Cancer Neg Hx   . Heart disease Brother     History   Social History  . Marital Status: Married    Spouse Name: N/A    Number of Children: 5  . Years of Education: 10   Occupational History  .     Social  History Main Topics  . Smoking status: Former Smoker -- 1.00 packs/day for 20 years    Types: Cigarettes    Quit date: 09/21/1998  . Smokeless tobacco: Never Used  . Alcohol Use: 6.0 oz/week    10 Cans of beer per week  . Drug Use: No  . Sexually Active: Yes   Other Topics Concern  . Not on file   Social History Narrative   Caffienated beverages- Yes   Seat belts use-Yes   Smoke Alarm in home-yes   Guns / fireman- No   Physical abuse- No     Constitutional: Denies fever, malaise, fatigue, headache or abrupt weight changes.  HEENT: Pt reports blurred vision. Denies eye pain, eye redness, ear pain, ringing in the ears, wax buildup, runny nose, nasal congestion, bloody nose, or sore throat. Respiratory: Denies difficulty breathing, shortness of breath, cough or sputum production.   Cardiovascular: Denies chest pain, chest tightness, palpitations or swelling in the hands or feet.  Neurological: Pt reports lightheadedness and numbness around lips. Denies  difficulty with memory, difficulty with speech or problems with balance and coordination.   No other specific complaints in a complete review of systems (except as listed in HPI above).  Objective:   Physical Exam   BP 146/90  Pulse 62  Temp(Src) 98.1 F (36.7 C) (Oral)  Wt 241 lb (109.317 kg)  BMI 35.57 kg/m2  SpO2 99% Wt Readings from Last 3 Encounters:  12/24/12 241 lb (109.317 kg)  12/17/12 238 lb (107.956 kg)  12/16/12 240 lb 12.8 oz (109.226 kg)    General: Appears his stated age, well developed, well nourished in NAD. HEENT: Head: normal shape and size; Eyes: sclera white, no icterus, conjunctiva pink, PERRLA and EOMs intact; Ears: Tm's gray and intact, normal light reflex; Nose: mucosa pink and moist, septum midline; Throat/Mouth: Teeth present, mucosa pink and moist, no exudate, lesions or ulcerations noted.  Cardiovascular: Normal rate and rhythm. S1,S2 noted.  No murmur, rubs or gallops noted. No JVD or BLE  edema. No carotid bruits noted. Pulmonary/Chest: Normal effort and positive vesicular breath sounds. No respiratory distress. No wheezes, rales or ronchi noted.   Neurological: Alert and oriented. Cranial nerves II-XII intact. Coordination normal. +DTRs bilaterally.   BMET    Component Value Date/Time   NA 140 10/31/2011 1010   K 4.8 10/31/2011 1010   CL 104 10/31/2011 1010   CO2 31 10/31/2011 1010   GLUCOSE 89 10/31/2011 1010   BUN 14 10/31/2011 1010   CREATININE 0.9 10/31/2011 1010   CALCIUM 9.6 10/31/2011 1010    Lipid Panel     Component Value Date/Time   CHOL 177 06/03/2012 0940   TRIG 184.0* 06/03/2012 0940   HDL 40.40 06/03/2012 0940   CHOLHDL 4 06/03/2012 0940   VLDL 36.8 06/03/2012 0940   LDLCALC 100* 06/03/2012 0940    CBC    Component Value Date/Time   WBC 7.1 09/21/2010 1421   RBC 4.39 09/21/2010 1421   HGB 14.9 09/21/2010 1421   HCT 42.6 09/21/2010 1421   PLT 165.0 09/21/2010 1421   MCV 97.0 09/21/2010 1421   MCHC 35.0 09/21/2010 1421   RDW 12.3 09/21/2010 1421   LYMPHSABS 2.5 09/21/2010 1421   MONOABS 0.6 09/21/2010 1421   EOSABS 0.2 09/21/2010 1421   BASOSABS 0.0 09/21/2010 1421    Hgb A1C No results found for this basename: HGBA1C        Assessment & Plan:   Lightheaded, dizziness, blurred vision and numbness of lips, likely medication side effects, new onset:  No longer taking medications Monitor symptoms If worse, go to ER Will discuss with Dr. Yetta Barre which antidepressant to switch you to.

## 2012-12-24 NOTE — Patient Instructions (Signed)

## 2012-12-25 ENCOUNTER — Telehealth: Payer: Self-pay | Admitting: *Deleted

## 2012-12-25 NOTE — Telephone Encounter (Signed)
Discussed with pt

## 2012-12-25 NOTE — Telephone Encounter (Signed)
Message copied by Nada Maclachlan on Wed Dec 25, 2012 11:52 AM ------      Message from: Lorre Munroe      Created: Wed Dec 25, 2012  8:37 AM      Regarding: call pt        Please call pt and let him know that I have discussed his case with Dr. Yetta Barre. He also feels this is a reaction to the medication. He does not want him to continue taking the [place] and [place] at this time. He also does not want to replace it with another medication. Monitor symptoms. If continues or worsens, RTC for further evaluation with Dr. Yetta Barre. ------

## 2012-12-27 ENCOUNTER — Other Ambulatory Visit (INDEPENDENT_AMBULATORY_CARE_PROVIDER_SITE_OTHER): Payer: BC Managed Care – PPO

## 2012-12-27 ENCOUNTER — Ambulatory Visit: Payer: BC Managed Care – PPO | Admitting: Internal Medicine

## 2012-12-27 ENCOUNTER — Ambulatory Visit (INDEPENDENT_AMBULATORY_CARE_PROVIDER_SITE_OTHER)
Admission: RE | Admit: 2012-12-27 | Discharge: 2012-12-27 | Disposition: A | Discharge status: Home / Self Care | Payer: BC Managed Care – PPO | Source: Ambulatory Visit | Attending: Internal Medicine | Admitting: Internal Medicine

## 2012-12-27 ENCOUNTER — Encounter: Payer: Self-pay | Admitting: Internal Medicine

## 2012-12-27 ENCOUNTER — Ambulatory Visit (INDEPENDENT_AMBULATORY_CARE_PROVIDER_SITE_OTHER): Payer: BC Managed Care – PPO | Admitting: Internal Medicine

## 2012-12-27 VITALS — BP 122/78 | HR 67 | Temp 97.0°F | Resp 16 | Wt 239.0 lb

## 2012-12-27 DIAGNOSIS — R0789 Other chest pain: Secondary | ICD-10-CM

## 2012-12-27 DIAGNOSIS — E669 Obesity, unspecified: Secondary | ICD-10-CM

## 2012-12-27 LAB — CBC WITH DIFFERENTIAL/PLATELET
Basophils Absolute: 0 10*3/uL (ref 0.0–0.1)
Basophils Relative: 0.7 % (ref 0.0–3.0)
Eosinophils Absolute: 0.2 10*3/uL (ref 0.0–0.7)
HCT: 47.6 % (ref 39.0–52.0)
Hemoglobin: 16.1 g/dL (ref 13.0–17.0)
Lymphs Abs: 2.4 10*3/uL (ref 0.7–4.0)
MCHC: 33.7 g/dL (ref 30.0–36.0)
Neutro Abs: 3.2 10*3/uL (ref 1.4–7.7)
RDW: 12.8 % (ref 11.5–14.6)

## 2012-12-27 LAB — COMPREHENSIVE METABOLIC PANEL
ALT: 37 U/L (ref 0–53)
BUN: 8 mg/dL (ref 6–23)
CO2: 30 mEq/L (ref 19–32)
Creatinine, Ser: 1 mg/dL (ref 0.4–1.5)
GFR: 81.71 mL/min (ref >60.00)
Total Bilirubin: 1.5 mg/dL — ABNORMAL HIGH (ref 0.3–1.2)

## 2012-12-27 LAB — BRAIN NATRIURETIC PEPTIDE: Pro B Natriuretic peptide (BNP): 26 pg/mL (ref 0.0–100.0)

## 2012-12-27 NOTE — Assessment & Plan Note (Signed)
He has no interest in trying to lose weight

## 2012-12-27 NOTE — Patient Instructions (Signed)
Chest Pain (Nonspecific) °It is often hard to give a specific diagnosis for the cause of chest pain. There is always a chance that your pain could be related to something serious, such as a heart attack or a blood clot in the lungs. You need to follow up with your caregiver for further evaluation. °CAUSES  °· Heartburn. °· Pneumonia or bronchitis. °· Anxiety or stress. °· Inflammation around your heart (pericarditis) or lung (pleuritis or pleurisy). °· A blood clot in the lung. °· A collapsed lung (pneumothorax). It can develop suddenly on its own (spontaneous pneumothorax) or from injury (trauma) to the chest. °· Shingles infection (herpes zoster virus). °The chest wall is composed of bones, muscles, and cartilage. Any of these can be the source of the pain. °· The bones can be bruised by injury. °· The muscles or cartilage can be strained by coughing or overwork. °· The cartilage can be affected by inflammation and become sore (costochondritis). °DIAGNOSIS  °Lab tests or other studies, such as X-rays, electrocardiography, stress testing, or cardiac imaging, may be needed to find the cause of your pain.  °TREATMENT  °· Treatment depends on what may be causing your chest pain. Treatment may include: °· Acid blockers for heartburn. °· Anti-inflammatory medicine. °· Pain medicine for inflammatory conditions. °· Antibiotics if an infection is present. °· You may be advised to change lifestyle habits. This includes stopping smoking and avoiding alcohol, caffeine, and chocolate. °· You may be advised to keep your head raised (elevated) when sleeping. This reduces the chance of acid going backward from your stomach into your esophagus. °· Most of the time, nonspecific chest pain will improve within 2 to 3 days with rest and mild pain medicine. °HOME CARE INSTRUCTIONS  °· If antibiotics were prescribed, take your antibiotics as directed. Finish them even if you start to feel better. °· For the next few days, avoid physical  activities that bring on chest pain. Continue physical activities as directed. °· Do not smoke. °· Avoid drinking alcohol. °· Only take over-the-counter or prescription medicine for pain, discomfort, or fever as directed by your caregiver. °· Follow your caregiver's suggestions for further testing if your chest pain does not go away. °· Keep any follow-up appointments you made. If you do not go to an appointment, you could develop lasting (chronic) problems with pain. If there is any problem keeping an appointment, you must call to reschedule. °SEEK MEDICAL CARE IF:  °· You think you are having problems from the medicine you are taking. Read your medicine instructions carefully. °· Your chest pain does not go away, even after treatment. °· You develop a rash with blisters on your chest. °SEEK IMMEDIATE MEDICAL CARE IF:  °· You have increased chest pain or pain that spreads to your arm, neck, jaw, back, or abdomen. °· You develop shortness of breath, an increasing cough, or you are coughing up blood. °· You have severe back or abdominal pain, feel nauseous, or vomit. °· You develop severe weakness, fainting, or chills. °· You have a fever. °THIS IS AN EMERGENCY. Do not wait to see if the pain will go away. Get medical help at once. Call your local emergency services (911 in [place] Do not drive yourself to the hospital. °MAKE SURE YOU:  °· Understand these instructions. °· Will watch your condition. °· Will get help right away if you are not doing well or get worse. °Document Released: 03/01/2005 Document Revised: 08/14/2011 Document Reviewed: 12/26/2007 °Exit[employer]® Patient Information ©2014 [employer]. ° °

## 2012-12-27 NOTE — Progress Notes (Signed)
Subjective:    Patient ID: Brandon Lane, male    DOB: 1965-11-15, 47 y.o.   MRN: [id number]  Chest Pain  This is a new problem. The current episode started in the past 7 days. The onset quality is gradual. The problem occurs intermittently. The problem has been resolved (he had chest tightness two day ago while at rest). The pain is present in the substernal region. The pain is at a severity of 1/10. The pain is mild. The quality of the pain is described as tightness. The pain does not radiate. Associated symptoms include back pain, dizziness, malaise/fatigue, numbness (around his lips) and shortness of breath. Pertinent negatives include no abdominal pain, claudication, cough, diaphoresis, exertional chest pressure, fever, headaches, hemoptysis, irregular heartbeat, leg pain, lower extremity edema, nausea, near-syncope, orthopnea, palpitations, PND, sputum production, syncope, vomiting or weakness. The pain is aggravated by nothing. He has tried nothing for the symptoms. Risk factors include male gender and obesity.      Review of Systems  Constitutional: Positive for malaise/fatigue and fatigue. Negative for fever, chills, diaphoresis, activity change, appetite change and unexpected weight change.  HENT: Negative.   Eyes: Negative.   Respiratory: Positive for shortness of breath. Negative for apnea, cough, hemoptysis, sputum production, choking, chest tightness and stridor.   Cardiovascular: Positive for chest pain. Negative for palpitations, orthopnea, claudication, leg swelling, syncope, PND and near-syncope.  Gastrointestinal: Negative.  Negative for nausea, vomiting, abdominal pain, diarrhea and constipation.  Endocrine: Negative.   Genitourinary: Negative.   Musculoskeletal: Positive for back pain. Negative for myalgias, joint swelling, arthralgias and gait problem.  Skin: Negative.   Allergic/Immunologic: Negative.   Neurological: Positive for dizziness and numbness (around his lips).  Negative for tremors, syncope, speech difficulty, weakness, light-headedness and headaches.  Hematological: Negative for adenopathy. Does not bruise/bleed easily.  Psychiatric/Behavioral: Negative.        Objective:   Physical Exam  Vitals reviewed. Constitutional: He is oriented to person, place, and time. He appears well-developed and well-nourished.  Non-toxic appearance. He does not have a sickly appearance. He does not appear ill. No distress.  HENT:  Head: Normocephalic and atraumatic.  Mouth/Throat: Oropharynx is clear and moist. No oropharyngeal exudate.  Eyes: Conjunctivae are normal. Right eye exhibits no discharge. Left eye exhibits no discharge. No scleral icterus.  Neck: Normal range of motion. Neck supple. No JVD present. No tracheal deviation present. No thyromegaly present.  Cardiovascular: Normal rate, regular rhythm, normal heart sounds and intact distal pulses.  Exam reveals no gallop and no friction rub.   No murmur heard. Pulmonary/Chest: Effort normal and breath sounds normal. No stridor. No respiratory distress. He has no wheezes. He has no rales. He exhibits no tenderness.  Abdominal: Soft. Bowel sounds are normal. He exhibits no distension and no mass. There is no tenderness. There is no rebound and no guarding.  Musculoskeletal: Normal range of motion. He exhibits no edema and no tenderness.  Lymphadenopathy:    He has no cervical adenopathy.  Neurological: He is oriented to person, place, and time.  Skin: Skin is warm and dry. No rash noted. He is not diaphoretic. No erythema. No pallor.  Psychiatric: He has a normal mood and affect. His behavior is normal. Judgment and thought content normal.      Lab Results  Component Value Date   WBC 7.1 09/21/2010   HGB 14.9 09/21/2010   HCT 42.6 09/21/2010   PLT 165.0 09/21/2010   GLUCOSE 89 10/31/2011   CHOL 177 06/03/2012  TRIG 184.0* 06/03/2012   HDL 40.40 06/03/2012   LDLDIRECT 97.3 09/21/2010   LDLCALC 100*  06/03/2012   ALT 23 10/31/2011   AST 22 10/31/2011   NA 140 10/31/2011   K 4.8 10/31/2011   CL 104 10/31/2011   CREATININE 0.9 10/31/2011   BUN 14 10/31/2011   CO2 31 10/31/2011   TSH 2.37 09/21/2010      Assessment & Plan:

## 2012-12-27 NOTE — Assessment & Plan Note (Signed)
His EKG is normal, the pain has resolved His s/s are atypical  Today will check his labs to look for PE, ischemia, pulm edema, anemia, abnormal lytes Will also check a CXR to if he has edema, pneumothorax, etc.

## 2013-01-03 ENCOUNTER — Encounter: Payer: Self-pay | Admitting: Internal Medicine

## 2013-01-03 ENCOUNTER — Ambulatory Visit (INDEPENDENT_AMBULATORY_CARE_PROVIDER_SITE_OTHER): Payer: BC Managed Care – PPO | Admitting: Internal Medicine

## 2013-01-03 ENCOUNTER — Other Ambulatory Visit (INDEPENDENT_AMBULATORY_CARE_PROVIDER_SITE_OTHER): Payer: BC Managed Care – PPO

## 2013-01-03 VITALS — BP 130/96 | HR 72 | Temp 97.6°F | Resp 16 | Wt 241.0 lb

## 2013-01-03 DIAGNOSIS — I10 Essential (primary) hypertension: Secondary | ICD-10-CM | POA: Insufficient documentation

## 2013-01-03 DIAGNOSIS — D696 Thrombocytopenia, unspecified: Secondary | ICD-10-CM

## 2013-01-03 DIAGNOSIS — R590 Localized enlarged lymph nodes: Secondary | ICD-10-CM | POA: Insufficient documentation

## 2013-01-03 LAB — CBC WITH DIFFERENTIAL/PLATELET
Basophils Relative: 0.6 % (ref 0.0–3.0)
Eosinophils Absolute: 0.3 10*3/uL (ref 0.0–0.7)
Eosinophils Relative: 4 % (ref 0.0–5.0)
HCT: 44.5 % (ref 39.0–52.0)
Hemoglobin: 15.1 g/dL (ref 13.0–17.0)
MCHC: 33.9 g/dL (ref 30.0–36.0)
MCV: 96.9 fl (ref 78.0–100.0)
Monocytes Absolute: 0.8 10*3/uL (ref 0.1–1.0)
Neutro Abs: 4.2 10*3/uL (ref 1.4–7.7)
RBC: 4.59 Mil/uL (ref 4.22–5.81)
WBC: 8.2 10*3/uL (ref 4.5–10.5)

## 2013-01-03 MED ORDER — OLMESARTAN MEDOXOMIL 40 MG PO TABS: 40.0000 mg | ORAL_TABLET | Freq: Every day | ORAL | Status: DC

## 2013-01-03 NOTE — Progress Notes (Signed)
Subjective:    Patient ID: Brandon Lane, male    DOB: 1965/06/19, 47 y.o.   MRN: [id number]  Hypertension This is a new problem. The current episode started 1 to 4 weeks ago. The problem has been gradually worsening since onset. The problem is uncontrolled. Pertinent negatives include no anxiety, blurred vision, chest pain, headaches, malaise/fatigue, neck pain, orthopnea, palpitations, peripheral edema, PND, shortness of breath or sweats. Agents associated with hypertension include NSAIDs. Risk factors for coronary artery disease include obesity. Past treatments include nothing. The current treatment provides no improvement. Compliance problems include exercise and diet.  Identifiable causes of hypertension include sleep apnea.      Review of Systems  Constitutional: Negative.  Negative for fever, chills, malaise/fatigue, diaphoresis, appetite change and fatigue.  HENT: Negative.  Negative for neck pain.   Eyes: Negative.  Negative for blurred vision.  Respiratory: Positive for apnea. Negative for choking, chest tightness, shortness of breath, wheezing and stridor.   Cardiovascular: Negative.  Negative for chest pain, palpitations, orthopnea, leg swelling and PND.  Gastrointestinal: Negative.  Negative for nausea, vomiting, abdominal pain, diarrhea, constipation, blood in stool and anal bleeding.  Endocrine: Negative.   Genitourinary: Negative.   Musculoskeletal: Positive for back pain. Negative for myalgias, joint swelling, arthralgias and gait problem.  Skin: Negative.   Allergic/Immunologic: Negative.   Neurological: Negative for dizziness, tremors, syncope, facial asymmetry, speech difficulty, weakness, light-headedness, numbness and headaches.  Hematological: Negative.  Negative for adenopathy. Does not bruise/bleed easily.  Psychiatric/Behavioral: Negative.        Objective:   Physical Exam  Vitals reviewed. Constitutional: He is oriented to person, place, and time. He appears  well-developed and well-nourished. No distress.  HENT:  Head: Normocephalic and atraumatic.  Mouth/Throat: Oropharynx is clear and moist. No oropharyngeal exudate.  Eyes: Conjunctivae are normal. Right eye exhibits no discharge. Left eye exhibits no discharge. No scleral icterus.  Neck: Normal range of motion. Neck supple. No JVD present. No tracheal deviation present. No thyromegaly present.  Cardiovascular: Normal rate, regular rhythm, normal heart sounds and intact distal pulses.  Exam reveals no gallop and no friction rub.   No murmur heard. Pulmonary/Chest: Effort normal and breath sounds normal. No stridor. No respiratory distress. He has no wheezes. He has no rales. He exhibits no tenderness.  Abdominal: Soft. Bowel sounds are normal. He exhibits no distension and no mass. There is no tenderness. There is no rebound and no guarding.  Musculoskeletal: Normal range of motion. He exhibits no edema and no tenderness.  Lymphadenopathy:    He has no cervical adenopathy.  Neurological: He is oriented to person, place, and time.  Skin: Skin is warm and dry. No rash noted. He is not diaphoretic. No erythema. No pallor.  Psychiatric: He has a normal mood and affect. His behavior is normal. Judgment and thought content normal.     Lab Results  Component Value Date   WBC 6.5 12/27/2012   HGB 16.1 12/27/2012   HCT 47.6 12/27/2012   PLT 87.0* 12/27/2012   GLUCOSE 90 12/27/2012   CHOL 177 06/03/2012   TRIG 184.0* 06/03/2012   HDL 40.40 06/03/2012   LDLDIRECT 97.3 09/21/2010   LDLCALC 100* 06/03/2012   ALT 37 12/27/2012   AST 28 12/27/2012   NA 141 12/27/2012   K 3.5 12/27/2012   CL 104 12/27/2012   CREATININE 1.0 12/27/2012   BUN 8 12/27/2012   CO2 30 12/27/2012   TSH 2.37 09/21/2010  Assessment & Plan:

## 2013-01-03 NOTE — Assessment & Plan Note (Signed)
CBC today shows that this is improving

## 2013-01-03 NOTE — Patient Instructions (Signed)

## 2013-01-03 NOTE — Assessment & Plan Note (Signed)
He will work on his lifestyle modifications Will start benicar to lower the DBP

## 2013-01-10 ENCOUNTER — Other Ambulatory Visit: Payer: Self-pay | Admitting: Internal Medicine

## 2013-01-10 ENCOUNTER — Encounter: Payer: Self-pay | Admitting: Internal Medicine

## 2013-01-10 ENCOUNTER — Ambulatory Visit (INDEPENDENT_AMBULATORY_CARE_PROVIDER_SITE_OTHER): Payer: BC Managed Care – PPO | Admitting: Internal Medicine

## 2013-01-10 VITALS — BP 150/92 | HR 70 | Temp 97.4°F | Wt 245.2 lb

## 2013-01-10 DIAGNOSIS — M545 Low back pain: Secondary | ICD-10-CM

## 2013-01-10 DIAGNOSIS — M5136 Other intervertebral disc degeneration, lumbar region: Secondary | ICD-10-CM

## 2013-01-10 DIAGNOSIS — F329 Major depressive disorder, single episode, unspecified: Secondary | ICD-10-CM

## 2013-01-10 DIAGNOSIS — I10 Essential (primary) hypertension: Secondary | ICD-10-CM

## 2013-01-10 DIAGNOSIS — F3289 Other specified depressive episodes: Secondary | ICD-10-CM

## 2013-01-10 MED ORDER — VENLAFAXINE HCL ER 75 MG PO CP24: 75.0000 mg | ORAL_CAPSULE | Freq: Every day | ORAL | Status: DC

## 2013-01-10 MED ORDER — OXYCODONE-ACETAMINOPHEN 10-325 MG PO TABS: 1.0000 | ORAL_TABLET | Freq: Three times a day (TID) | ORAL | Status: DC | PRN

## 2013-01-10 NOTE — Patient Instructions (Signed)
How to Take Your Blood Pressure  These instructions are only for electronic home blood pressure machines. You will need:   An automatic or semi-automatic blood pressure machine.  Fresh batteries for the blood pressure machine. HOW DO I USE THESE TOOLS TO CHECK MY BLOOD PRESSURE?   There are 2 numbers that make up your blood pressure. For example: 120/80.  The first number (120 in our example) is called the "systolic pressure." It is a measure of the pressure in your blood vessels when your heart is pumping blood.  The second number (80 in our example) is called the "diastolic pressure." It is a measure of the pressure in your blood vessels when your heart is resting between beats.  Before you buy a home blood pressure machine, check the size of your arm so you can buy the right size cuff. Here is how to check the size of your arm:  Use a tape measure that shows both inches and centimeters.  Wrap the tape measure around the middle upper part of your arm. You may need someone to help you measure right.  Write down your arm measurement in both inches and centimeters.  To measure your blood pressure right, it is important to have the right size cuff.  If your arm is up to 13 inches (37 to 34 centimeters), get an adult cuff size.  If your arm is 13 to 17 inches (35 to 44 centimeters), get a large adult cuff size.  If your arm is 17 to 20 inches (45 to 52 centimeters), get an adult thigh cuff.  Try to rest or relax for at least 30 minutes before you check your blood pressure.  Do not smoke.  Do not have any drinks with caffeine, such as:  Pop.  Coffee.  Tea.  Check your blood pressure in a quiet room.  Sit down and stretch out your arm on a table. Keep your arm at about the level of your heart. Let your arm relax. GETTING BLOOD PRESSURE READINGS  Make sure you remove any tight-fighting clothing from your arm. Wrap the cuff around your upper arm. Wrap it just above the bend,  and above where you felt the pulse. You should be able to slip a finger between the cuff and your arm. If you cannot slip a finger in the cuff, it is too tight and should be removed and rewrapped.  Some units requires you to manually pump up the arm cuff.  Automatic units inflate the cuff when you press a button.  Cuff deflation is automatic in both models.  After the cuff is inflated, the unit measures your blood pressure and pulse. The readings are displayed on a monitor. Hold still and breathe normally while the cuff is inflated.  Getting a reading takes less than a minute.  Some models store readings in a memory. Some provide a printout of readings.  Get readings at different times of the day. You should wait at least 5 minutes between readings. Take readings with you to your next doctor's visit. Document Released: 05/04/2008 Document Revised: 08/14/2011 Document Reviewed: 05/04/2008 [employer] Patient Information 2014 [employer].  

## 2013-01-10 NOTE — Progress Notes (Signed)
Subjective:    Patient ID: Brandon Lane, male    DOB: [birth year], 47 y.o.   MRN: [id number]  HPI  Pt presents to the clinic today to discuss his medications. He was started on Benicar 1 week ago. He has noticed that his blood pressure has been more elevated on the medicine that off of it. He is not sure if he should continue taking it. He has notices less headaches and dizziness since starting the medication. Additionally, he would like to be started on Effexor. He has been previously on some atypical antidepressants but stopped them secondary to side effects. I disccussed this issue with Dr. Yetta Barre who felt like depression may not have been the issue and that he should be evaluated by a psychiatrist to consider alternative diagnosis. He does not want to go to a psychiatrist. He has been taking his wifes Effexor 75 mg. He is doing well on this without side effects. He would like an RX today.  Review of Systems      Past Medical History  Diagnosis Date  . Depression   . GERD (gastroesophageal reflux disease)   . DDD (degenerative disc disease), lumbar     Current Outpatient Prescriptions  Medication Sig Dispense Refill  . ibuprofen (ADVIL,MOTRIN) 600 MG tablet Take 1 tablet (600 mg total) by mouth every 8 (eight) hours as needed for pain.  90 tablet  5  . olmesartan (BENICAR) 40 MG tablet Take 1 tablet (40 mg total) by mouth daily.  90 tablet  1  . oxyCODONE-acetaminophen (PERCOCET) 10-325 MG per tablet Take 1 tablet by mouth every 8 (eight) hours as needed for pain. Fill on or after 12/16/12  75 tablet  0   No current facility-administered medications for this visit.    No Known Allergies  Family History  Problem Relation Age of Onset  . Arthritis Mother   . Hyperlipidemia Mother   . Stroke Mother   . Hypertension Mother   . Kidney disease Mother   . Diabetes Mother   . Alcohol abuse Father   . Hypertension Sister   . Cancer Neg Hx   . Early death Neg Hx   . Heart disease  Brother   . Hypertension Brother   . Stroke Brother     History   Social History  . Marital Status: Married    Spouse Name: N/A    Number of Children: 5  . Years of Education: 10   Occupational History  .     Social History Main Topics  . Smoking status: Former Smoker -- 1.00 packs/day for 20 years    Types: Cigarettes    Quit date: 09/21/1998  . Smokeless tobacco: Never Used  . Alcohol Use: 6.0 oz/week    10 Cans of beer per week  . Drug Use: No  . Sexually Active: Yes   Other Topics Concern  . Not on file   Social History Narrative   Caffienated beverages- Yes   Seat belts use-Yes   Smoke Alarm in home-yes   Guns / fireman- No   Physical abuse- No     Constitutional: Denies fever, malaise, fatigue, headache or abrupt weight changes.  Respiratory: Denies difficulty breathing, shortness of breath, cough or sputum production.   Cardiovascular: Denies chest pain, chest tightness, palpitations or swelling in the hands or feet.   Neurological: Denies dizziness, difficulty with memory, difficulty with speech or problems with balance and coordination.   No other specific complaints in a complete review of  systems (except as listed in HPI above).  Objective:   Physical Exam   BP 150/92  Pulse 70  Temp(Src) 97.4 F (36.3 C) (Oral)  Wt 245 lb 3.2 oz (111.222 kg)  BMI 36.19 kg/m2  SpO2 98% Wt Readings from Last 3 Encounters:  01/10/13 245 lb 3.2 oz (111.222 kg)  01/03/13 241 lb (109.317 kg)  12/27/12 239 lb (108.41 kg)    General: Appears his stated age, well developed, well nourished in NAD. Cardiovascular: Normal rate and rhythm. S1,S2 noted.  No murmur, rubs or gallops noted. No JVD or BLE edema. No carotid bruits noted. Pulmonary/Chest: Normal effort and positive vesicular breath sounds. No respiratory distress. No wheezes, rales or ronchi noted.  Neurological: Alert and oriented. Cranial nerves II-XII intact. Coordination normal. +DTRs  bilaterally. Psychiatric: Mood angry and affect normal. Behavior is normal. Judgment and thought content normal.    BMET    Component Value Date/Time   NA 141 12/27/2012 1047   K 3.5 12/27/2012 1047   CL 104 12/27/2012 1047   CO2 30 12/27/2012 1047   GLUCOSE 90 12/27/2012 1047   BUN 8 12/27/2012 1047   CREATININE 1.0 12/27/2012 1047   CALCIUM 9.5 12/27/2012 1047    Lipid Panel     Component Value Date/Time   CHOL 177 06/03/2012 0940   TRIG 184.0* 06/03/2012 0940   HDL 40.40 06/03/2012 0940   CHOLHDL 4 06/03/2012 0940   VLDL 36.8 06/03/2012 0940   LDLCALC 100* 06/03/2012 0940    CBC    Component Value Date/Time   WBC 8.2 01/03/2013 1002   RBC 4.59 01/03/2013 1002   HGB 15.1 01/03/2013 1002   HCT 44.5 01/03/2013 1002   PLT 116.0* 01/03/2013 1002   MCV 96.9 01/03/2013 1002   MCHC 33.9 01/03/2013 1002   RDW 12.8 01/03/2013 1002   LYMPHSABS 2.8 01/03/2013 1002   MONOABS 0.8 01/03/2013 1002   EOSABS 0.3 01/03/2013 1002   BASOSABS 0.0 01/03/2013 1002    Hgb A1C No results found for this basename: HGBA1C        Assessment & Plan:   Mood disorder, unspecified:  Encouraged pt to visit a psychiatrist- pt declined Will try Efffexor 75 mg daily Do not take other peoples medication  Hypertension, uncontrolled:  Continue taking your medication for 1 more week RTC in 1 week for BP check

## 2013-01-14 DIAGNOSIS — Z0279 Encounter for issue of other medical certificate: Secondary | ICD-10-CM

## 2013-01-28 ENCOUNTER — Telehealth: Payer: Self-pay | Admitting: Pulmonary Disease

## 2013-01-28 NOTE — Telephone Encounter (Signed)
I have this order and will give to RA this afternoon to sign. i called and made tammy aware. Nothing further needed

## 2013-01-30 ENCOUNTER — Telehealth: Payer: Self-pay | Admitting: Pulmonary Disease

## 2013-01-30 NOTE — Telephone Encounter (Signed)
I called and made Tammy aware. She voiced her understanding and needed nothing further

## 2013-01-30 NOTE — Telephone Encounter (Signed)
Signed, will leave on my desk in am

## 2013-01-30 NOTE — Telephone Encounter (Signed)
Dr. Vassie Loll has this order. Will forward to him to let me know once signed.

## 2013-03-07 ENCOUNTER — Ambulatory Visit: Payer: BC Managed Care – PPO | Admitting: Internal Medicine

## 2013-03-07 DIAGNOSIS — Z0289 Encounter for other administrative examinations: Secondary | ICD-10-CM

## 2013-03-16 ENCOUNTER — Telehealth: Payer: Self-pay | Admitting: Pulmonary Disease

## 2013-03-16 DIAGNOSIS — G4733 Obstructive sleep apnea (adult) (pediatric): Secondary | ICD-10-CM

## 2013-03-16 NOTE — Telephone Encounter (Signed)
4m Download 03/02/13 -cpap very effective, avg pr 9 cm, he is not using this CPAP will be taken away unless he uses at least 4h every night Change to 9 cm, download & FU in 4 wks

## 2013-03-18 NOTE — Telephone Encounter (Signed)
I spoke with pt and aware of results. Order sent and appt scheduled.

## 2013-04-10 ENCOUNTER — Encounter: Payer: Self-pay | Admitting: Internal Medicine

## 2013-04-10 ENCOUNTER — Ambulatory Visit (INDEPENDENT_AMBULATORY_CARE_PROVIDER_SITE_OTHER): Payer: BC Managed Care – PPO | Admitting: Internal Medicine

## 2013-04-10 ENCOUNTER — Ambulatory Visit (INDEPENDENT_AMBULATORY_CARE_PROVIDER_SITE_OTHER)
Admission: RE | Admit: 2013-04-10 | Discharge: 2013-04-10 | Disposition: A | Discharge status: Home / Self Care | Payer: BC Managed Care – PPO | Source: Ambulatory Visit | Attending: Internal Medicine | Admitting: Internal Medicine

## 2013-04-10 ENCOUNTER — Other Ambulatory Visit: Payer: Self-pay

## 2013-04-10 VITALS — BP 138/92 | HR 69 | Temp 97.9°F | Resp 16 | Ht 69.0 in | Wt 238.5 lb

## 2013-04-10 DIAGNOSIS — F329 Major depressive disorder, single episode, unspecified: Secondary | ICD-10-CM

## 2013-04-10 DIAGNOSIS — S99911A Unspecified injury of right ankle, initial encounter: Secondary | ICD-10-CM

## 2013-04-10 DIAGNOSIS — M545 Low back pain: Secondary | ICD-10-CM

## 2013-04-10 DIAGNOSIS — M5137 Other intervertebral disc degeneration, lumbosacral region: Secondary | ICD-10-CM

## 2013-04-10 DIAGNOSIS — B351 Tinea unguium: Secondary | ICD-10-CM | POA: Insufficient documentation

## 2013-04-10 DIAGNOSIS — S99919A Unspecified injury of unspecified ankle, initial encounter: Secondary | ICD-10-CM | POA: Insufficient documentation

## 2013-04-10 DIAGNOSIS — S8990XA Unspecified injury of unspecified lower leg, initial encounter: Secondary | ICD-10-CM

## 2013-04-10 DIAGNOSIS — M5136 Other intervertebral disc degeneration, lumbar region: Secondary | ICD-10-CM

## 2013-04-10 MED ORDER — OXYCODONE-ACETAMINOPHEN 10-325 MG PO TABS: 1.0000 | ORAL_TABLET | Freq: Three times a day (TID) | ORAL | Status: DC | PRN

## 2013-04-10 MED ORDER — OXYCODONE-ACETAMINOPHEN 10-325 MG PO TABS: 1.0000 | ORAL_TABLET | Freq: Three times a day (TID) | ORAL | Status: AC | PRN

## 2013-04-10 MED ORDER — VENLAFAXINE HCL ER 150 MG PO CP24: 150.0000 mg | ORAL_CAPSULE | Freq: Every day | ORAL | Status: DC

## 2013-04-10 NOTE — Progress Notes (Signed)
Subjective:    Patient ID: Brandon Lane, male    DOB: 1966-02-10, 47 y.o.   MRN: [id number]  Ankle Injury  The incident occurred 2 days ago. The incident occurred at home. The injury mechanism was an inversion injury. The pain is present in the right ankle. The pain is at a severity of 2/10. The pain is mild. The pain has been intermittent since onset. Pertinent negatives include no inability to bear weight, loss of motion, loss of sensation, muscle weakness, numbness or tingling. The symptoms are aggravated by movement, palpation and weight bearing. He has tried acetaminophen and NSAIDs for the symptoms. The treatment provided mild relief.      Review of Systems  Constitutional: Negative.  Negative for fever, chills, diaphoresis, appetite change and fatigue.  HENT: Negative.   Eyes: Negative.   Respiratory: Negative.  Negative for cough, chest tightness, shortness of breath, wheezing and stridor.   Cardiovascular: Negative.  Negative for chest pain, palpitations and leg swelling.  Gastrointestinal: Negative.  Negative for nausea, vomiting, abdominal pain, diarrhea, constipation and blood in stool.  Endocrine: Negative.   Genitourinary: Negative.   Musculoskeletal: Positive for back pain. Negative for arthralgias, gait problem, joint swelling, myalgias, neck pain and neck stiffness.       He complains of persistent low back pain that he describes as a sharp/pinching sensation without radiation. Percocet does control his pain.  Skin: Positive for color change. Negative for pallor, rash and wound.       He is concerned about a white discoloration in his left great toenail.  Allergic/Immunologic: Negative.   Neurological: Negative for dizziness, tingling, tremors, speech difficulty, weakness, light-headedness, numbness and headaches.  Hematological: Negative.  Negative for adenopathy. Does not bruise/bleed easily.  Psychiatric/Behavioral: Positive for dysphoric mood. Negative for suicidal  ideas, hallucinations, behavioral problems, confusion, sleep disturbance, self-injury, decreased concentration and agitation. The patient is not nervous/anxious and is not hyperactive.        Objective:   Physical Exam  Vitals reviewed. Constitutional: He is oriented to person, place, and time. He appears well-developed and well-nourished. No distress.  HENT:  Head: Normocephalic and atraumatic.  Mouth/Throat: Oropharynx is clear and moist. No oropharyngeal exudate.  Eyes: Conjunctivae are normal. Right eye exhibits no discharge. Left eye exhibits no discharge. No scleral icterus.  Neck: Normal range of motion. Neck supple. No JVD present. No tracheal deviation present. No thyromegaly present.  Cardiovascular: Normal rate, regular rhythm, normal heart sounds and intact distal pulses.  Exam reveals no gallop and no friction rub.   No murmur heard. Pulmonary/Chest: Effort normal and breath sounds normal. No stridor. No respiratory distress. He has no wheezes. He has no rales. He exhibits no tenderness.  Abdominal: Soft. Bowel sounds are normal. He exhibits no distension and no mass. There is no tenderness. There is no rebound and no guarding.  Musculoskeletal: Normal range of motion. He exhibits no edema and no tenderness.       Right ankle: He exhibits swelling. He exhibits normal range of motion, no ecchymosis, no deformity, no laceration and normal pulse. No medial malleolus, no AITFL, no CF ligament and no proximal fibula tenderness found. Achilles tendon normal. Achilles tendon exhibits no pain, no defect and normal Thompson's test results.       Lumbar back: Normal. He exhibits normal range of motion, no tenderness, no bony tenderness, no swelling, no edema, no deformity, no laceration, no pain, no spasm and normal pulse.  Lymphadenopathy:    He has no  cervical adenopathy.  Neurological: He is alert and oriented to person, place, and time. He has normal reflexes. He displays normal  reflexes. No cranial nerve deficit. He exhibits normal muscle tone. Coordination normal.  Neg SLR in BLE  Skin: Skin is warm and dry. No rash noted. He is not diaphoretic. No erythema. No pallor.  Left great toenail shows a white exudate under and around the medial/base of the nail.  Psychiatric: He has a normal mood and affect. His behavior is normal. Judgment and thought content normal.     Lab Results  Component Value Date   WBC 8.2 01/03/2013   HGB 15.1 01/03/2013   HCT 44.5 01/03/2013   PLT 116.0* 01/03/2013   GLUCOSE 90 12/27/2012   CHOL 177 06/03/2012   TRIG 184.0* 06/03/2012   HDL 40.40 06/03/2012   LDLDIRECT 97.3 09/21/2010   LDLCALC 100* 06/03/2012   ALT 37 12/27/2012   AST 28 12/27/2012   NA 141 12/27/2012   K 3.5 12/27/2012   CL 104 12/27/2012   CREATININE 1.0 12/27/2012   BUN 8 12/27/2012   CO2 30 12/27/2012   TSH 2.37 09/21/2010       Assessment & Plan:

## 2013-04-10 NOTE — Patient Instructions (Signed)

## 2013-04-10 NOTE — Progress Notes (Signed)
Pre visit review using our clinic review tool, if applicable. No additional management support is needed unless otherwise documented below in the visit note. 

## 2013-04-11 ENCOUNTER — Encounter: Payer: Self-pay | Admitting: Internal Medicine

## 2013-04-11 NOTE — Assessment & Plan Note (Signed)
Plain film shows no change in the DDD He will continue the current meds for pain

## 2013-04-11 NOTE — Assessment & Plan Note (Signed)
Podiatry referral

## 2013-04-11 NOTE — Assessment & Plan Note (Signed)
Xray is negative for fracture Will treat for a sprain with RICE

## 2013-04-11 NOTE — Assessment & Plan Note (Signed)
He has increased the dose of effexor so I sent an updated Rx to his pharmacy

## 2013-04-23 ENCOUNTER — Telehealth: Payer: Self-pay | Admitting: Pulmonary Disease

## 2013-04-23 NOTE — Telephone Encounter (Signed)
Download 04/21/13 - 9 cm very effective but his usage is still poor - he is missing many nights - arrange Fu with TP

## 2013-04-24 NOTE — Telephone Encounter (Signed)
I spoke with patient about results and he verbalized understanding and had no questions. appt was already scheduled for 05/02/13. Nothing further needed

## 2013-05-02 ENCOUNTER — Encounter: Payer: Self-pay | Admitting: Pulmonary Disease

## 2013-05-02 ENCOUNTER — Ambulatory Visit (INDEPENDENT_AMBULATORY_CARE_PROVIDER_SITE_OTHER): Payer: BC Managed Care – PPO | Admitting: Pulmonary Disease

## 2013-05-02 VITALS — BP 142/90 | HR 70 | Temp 97.8°F | Ht 69.0 in | Wt 245.0 lb

## 2013-05-02 DIAGNOSIS — G4733 Obstructive sleep apnea (adult) (pediatric): Secondary | ICD-10-CM

## 2013-05-02 NOTE — Assessment & Plan Note (Signed)
Sleepiness seems tobe out of proportion with ESS 12/24 Wonder if this is related to shift disorder or perhaps we have to consider other possibilities such as narcolepsy. Feel the best option here is to persist with CPAP and to see how much he sleepiness improves once he returns to his second shift in December. If his sleepiness persists,then we may have to consider MSLT. This does not seem to be related to his medications.

## 2013-05-02 NOTE — Patient Instructions (Signed)
Your CPAP is set at 9 cm CPAP is very effective Weight loss encouraged, compliance with goal of at least 4-6 hrs every night is the expectation. Hope that tiredness will improve once you change to second shift

## 2013-05-02 NOTE — Progress Notes (Signed)
   Subjective:    Patient ID: Brandon Lane, male    DOB: September 19, 1965, 47 y.o.   MRN: [id number]  HPI  47 year old ex-smoker for FU of obstructive sleep apnea.    Epworth sleepiness score is 19/ 24. He reports excessive daytime somnolence the last 20 years but worse over the last one year. He has been working the third shift for 3 years.  Bedtime is anywhere from 8 AM to 6 PM since he works at 7 PM to 7 AM shift for days a week. Sleep latency is about 10 minutes, he is 2-3 awakenings without any post void sleep latency. He does not have the preferential body position, sleeps with 2 pillows, has a lot of leg movements during sleep, wife reports increased snoring on his back. He is out of bed at 6 PM feeling tired with dryness of mouth and occasional headache. He can sleep anywhere and at anytime. His drive to work is only 3 minutes and there have been no driving accidents. He is gained about 50 pounds in the last 4 years.  He has degenerative disc disease and takes an occasional Percocet about 1-2 per day.  He quit smoking in 2000 and smoked about a pack per day for 15 years prior to that.  Spirometry did not show any evidence of airway obstruction with FEV1 of 96%.    05/02/2013  11/2012  PSG showed mild obstructive sleep apnea with RDI 10/h , latency to REM sleep was 155 minutes   58m download 03/02/13 -cpap very effective, avg pr 9 cm Changed to 9 cm Download 05/02/13 - 9 cm very effective , no residuals, avg usage 4h, few missed days About to change to second shift in dec Wife feels he is much improved and snoring abolished, but his tiredness and sleepiness seems to persist.   Review of Systems neg for any significant sore throat, dysphagia, itching, sneezing, nasal congestion or excess/ purulent secretions, fever, chills, sweats, unintended wt loss, pleuritic or exertional cp, hempoptysis, orthopnea pnd or change in chronic leg swelling. Also denies presyncope, palpitations, heartburn,  abdominal pain, nausea, vomiting, diarrhea or change in bowel or urinary habits, dysuria,hematuria, rash, arthralgias, visual complaints, headache, numbness weakness or ataxia.     Objective:   Physical Exam  Gen. Pleasant, well-nourished, in no distress ENT - no lesions, no post nasal drip Neck: No JVD, no thyromegaly, no carotid bruits Lungs: no use of accessory muscles, no dullness to percussion, clear without rales or rhonchi  Cardiovascular: Rhythm regular, heart sounds  normal, no murmurs or gallops, no peripheral edema Musculoskeletal: No deformities, no cyanosis or clubbing        Assessment & Plan:

## 2013-05-09 ENCOUNTER — Ambulatory Visit: Payer: Self-pay

## 2013-05-16 ENCOUNTER — Ambulatory Visit (INDEPENDENT_AMBULATORY_CARE_PROVIDER_SITE_OTHER): Payer: BC Managed Care – PPO

## 2013-05-16 VITALS — BP 137/78 | HR 67 | Resp 12

## 2013-05-16 DIAGNOSIS — B351 Tinea unguium: Secondary | ICD-10-CM

## 2013-05-16 NOTE — Patient Instructions (Signed)
Onychomycosis/Fungal Toenails  WHAT IS IT? An infection that lies within the keratin of your nail plate that is caused by a fungus.  WHY ME? Fungal infections affect all ages, sexes, races, and creeds.  There may be many factors that predispose you to a fungal infection such as age, coexisting medical conditions such as diabetes, or an autoimmune disease; stress, medications, fatigue, genetics, etc.  Bottom line: fungus thrives in a warm, moist environment and your shoes offer such a location.  IS IT CONTAGIOUS? Theoretically, yes.  You do not want to share shoes, nail clippers or files with someone who has fungal toenails.  Walking around barefoot in the same room or sleeping in the same bed is unlikely to transfer the organism.  It is important to realize, however, that fungus can spread easily from one nail to the next on the same foot.  HOW DO WE TREAT THIS?  There are several ways to treat this condition.  Treatment may depend on many factors such as age, medications, pregnancy, liver and kidney conditions, etc.  It is best to ask your doctor which options are available to you.  1. No treatment.   Unlike many other medical concerns, you can live with this condition.  However for many people this can be a painful condition and may lead to ingrown toenails or a bacterial infection.  It is recommended that you keep the nails cut short to help reduce the amount of fungal nail. 2. Topical treatment.  These range from herbal remedies to prescription strength nail lacquers.  About 40-50% effective, topicals require twice daily application for approximately 9 to 12 months or until an entirely new nail has grown out.  The most effective topicals are medical grade medications available through physicians offices. 3. Oral antifungal medications.  With an 80-90% cure rate, the most common oral medication requires 3 to 4 months of therapy and stays in your system for a year as the new nail grows out.  Oral  antifungal medications do require blood work to make sure it is a safe drug for you.  A liver function panel will be performed prior to starting the medication and after the first month of treatment.  It is important to have the blood work performed to avoid any harmful side effects.  In general, this medication safe but blood work is required. 4. Laser Therapy.  This treatment is performed by applying a specialized laser to the affected nail plate.  This therapy is noninvasive, fast, and non-painful.  It is not covered by insurance and is therefore, out of pocket.  The results have been very good with a 80-95% cure rate.  [facility] is the only practice in the area to offer this therapy. 5. Permanent Nail Avulsion.  Removing the entire nail so that a new nail will not grow back. 6.  formula 3 topical antifungal for nails can be obtained here at the office 7. Fungi-Nail topical antifungal available at any drugstore over-the-counter  both formula 3 and Fungi-Nail can be used twice daily to the affected nail 486-12 months duration. Apply the medicines every morning every evening to the great toenail wash and normal soap and water keep the toes and feet dry change socks frequently. Spray shoes with Desenex or Tinactin spray every other day or as needed

## 2013-05-16 NOTE — Progress Notes (Signed)
   Subjective:    Patient ID: Brandon Lane, male    DOB: [birth year], 47 y.o.   MRN: [id number]  HPI Comments: '' LT FOOT BIG TOENAIL HAVE DISCOLORATION AND THIN FOR ABOUT 2 MONTHS. BUT TRIED NO TREATMENT.''     Review of Systems  Constitutional: Positive for appetite change, fatigue and unexpected weight change.  Endocrine: Positive for cold intolerance.  Musculoskeletal: Positive for back pain.  Skin: Positive for color change.  Neurological: Positive for headaches.  All other systems reviewed and are negative.       Objective:   Physical Exam Neurovascular status is intact with pedal pulses palpable. Epicritic and proprioceptive sensations unremarkable plantar response DTRs not elicited orthopedic exam unremarkable noncontributory. There is weight powdery and yellow discoloration of the medial nail border medial one third of the left hallux nail plate. May be history of some injury or trauma sometime ago. No history of treatment noted by patient. This is nonpainful nor is there any secondary infection.       Assessment & Plan:  Assessment onychomycosis affecting left great toenail white superficial topical infection noted suggested topical antifungal such as Fungi-Nail or formula 3 follow instructions apply twice daily for 6-12 months duration. Followup as needed  Alvan Dame DPM

## 2013-07-11 ENCOUNTER — Encounter: Payer: Self-pay | Admitting: Internal Medicine

## 2013-07-11 ENCOUNTER — Ambulatory Visit (INDEPENDENT_AMBULATORY_CARE_PROVIDER_SITE_OTHER): Payer: BC Managed Care – PPO | Admitting: Internal Medicine

## 2013-07-11 ENCOUNTER — Ambulatory Visit (INDEPENDENT_AMBULATORY_CARE_PROVIDER_SITE_OTHER): Payer: BC Managed Care – PPO

## 2013-07-11 VITALS — BP 126/82 | HR 67 | Temp 97.8°F | Resp 16 | Ht 69.0 in | Wt 246.0 lb

## 2013-07-11 DIAGNOSIS — E781 Pure hyperglyceridemia: Secondary | ICD-10-CM

## 2013-07-11 DIAGNOSIS — F32A Depression, unspecified: Secondary | ICD-10-CM

## 2013-07-11 DIAGNOSIS — M545 Low back pain, unspecified: Secondary | ICD-10-CM

## 2013-07-11 DIAGNOSIS — D696 Thrombocytopenia, unspecified: Secondary | ICD-10-CM

## 2013-07-11 DIAGNOSIS — I10 Essential (primary) hypertension: Secondary | ICD-10-CM

## 2013-07-11 DIAGNOSIS — F3289 Other specified depressive episodes: Secondary | ICD-10-CM

## 2013-07-11 DIAGNOSIS — M5136 Other intervertebral disc degeneration, lumbar region: Secondary | ICD-10-CM

## 2013-07-11 DIAGNOSIS — F329 Major depressive disorder, single episode, unspecified: Secondary | ICD-10-CM

## 2013-07-11 DIAGNOSIS — M5137 Other intervertebral disc degeneration, lumbosacral region: Secondary | ICD-10-CM

## 2013-07-11 LAB — CBC WITH DIFFERENTIAL/PLATELET
Basophils Absolute: 0 K/uL (ref 0.0–0.1)
Basophils Relative: 0.5 % (ref 0.0–3.0)
Eosinophils Absolute: 0.2 K/uL (ref 0.0–0.7)
Eosinophils Relative: 2.3 % (ref 0.0–5.0)
HCT: 44.5 % (ref 39.0–52.0)
Hemoglobin: 15.1 g/dL (ref 13.0–17.0)
Lymphocytes Relative: 33.9 % (ref 12.0–46.0)
Lymphs Abs: 2.4 K/uL (ref 0.7–4.0)
MCHC: 33.9 g/dL (ref 30.0–36.0)
MCV: 97.8 fl (ref 78.0–100.0)
Monocytes Absolute: 0.8 K/uL (ref 0.1–1.0)
Monocytes Relative: 10.6 % (ref 3.0–12.0)
Neutro Abs: 3.8 K/uL (ref 1.4–7.7)
Neutrophils Relative %: 52.7 % (ref 43.0–77.0)
Platelets: 127 K/uL — ABNORMAL LOW (ref 150.0–400.0)
RBC: 4.55 Mil/uL (ref 4.22–5.81)
RDW: 12.5 % (ref 11.5–14.6)
WBC: 7.2 K/uL (ref 4.5–10.5)

## 2013-07-11 MED ORDER — OXYCODONE-ACETAMINOPHEN 10-325 MG PO TABS: 1.0000 | ORAL_TABLET | Freq: Four times a day (QID) | ORAL | Status: DC | PRN

## 2013-07-11 NOTE — Progress Notes (Signed)
Pre visit review using our clinic review tool, if applicable. No additional management support is needed unless otherwise documented below in the visit note. 

## 2013-07-11 NOTE — Progress Notes (Signed)
Subjective:    Patient ID: Brandon Lane, male    DOB: 01/11/1966, 48 y.o.   MRN: [id number]  Back Pain This is a chronic problem. The current episode started more than 1 year ago. The problem occurs intermittently. The problem is unchanged. The pain is present in the lumbar spine. The quality of the pain is described as aching. The pain does not radiate. The pain is at a severity of 2/10. The pain is mild. The pain is worse during the day. Pertinent negatives include no abdominal pain, bladder incontinence, bowel incontinence, chest pain, dysuria, fever, headaches, leg pain, numbness, paresis, paresthesias, pelvic pain, perianal numbness, tingling, weakness or weight loss. Risk factors include obesity and lack of exercise. He has tried analgesics and NSAIDs for the symptoms. The treatment provided significant relief.      Review of Systems  Constitutional: Negative.  Negative for fever, chills, weight loss, diaphoresis, appetite change and fatigue.  HENT: Negative.   Eyes: Negative.   Respiratory: Negative.  Negative for cough, choking, chest tightness, shortness of breath and stridor.   Cardiovascular: Negative.  Negative for chest pain, palpitations and leg swelling.  Gastrointestinal: Negative.  Negative for nausea, vomiting, abdominal pain, diarrhea, constipation and bowel incontinence.  Endocrine: Negative.   Genitourinary: Negative.  Negative for bladder incontinence, dysuria and pelvic pain.  Musculoskeletal: Positive for back pain. Negative for arthralgias, gait problem, joint swelling, myalgias, neck pain and neck stiffness.  Skin: Negative.   Allergic/Immunologic: Negative.   Neurological: Negative.  Negative for tingling, weakness, numbness, headaches and paresthesias.  Hematological: Negative.  Negative for adenopathy. Does not bruise/bleed easily.  Psychiatric/Behavioral: Negative.  Negative for suicidal ideas, hallucinations, behavioral problems, confusion, sleep  disturbance, self-injury, dysphoric mood, decreased concentration and agitation. The patient is not nervous/anxious and is not hyperactive.        Objective:   Physical Exam  Vitals reviewed. Constitutional: He is oriented to person, place, and time. He appears well-developed and well-nourished. No distress.  HENT:  Head: Normocephalic and atraumatic.  Mouth/Throat: Oropharynx is clear and moist. No oropharyngeal exudate.  Eyes: Conjunctivae are normal. Right eye exhibits no discharge. Left eye exhibits no discharge. No scleral icterus.  Neck: Normal range of motion. Neck supple. No JVD present. No tracheal deviation present. No thyromegaly present.  Cardiovascular: Normal rate, regular rhythm, normal heart sounds and intact distal pulses.  Exam reveals no gallop and no friction rub.   No murmur heard. Pulmonary/Chest: Effort normal and breath sounds normal. No stridor. No respiratory distress. He has no wheezes. He has no rales. He exhibits no tenderness.  Abdominal: Soft. Bowel sounds are normal. He exhibits no distension and no mass. There is no tenderness. There is no rebound and no guarding.  Musculoskeletal: Normal range of motion. He exhibits no edema and no tenderness.       Lumbar back: Normal. He exhibits normal range of motion, no tenderness, no bony tenderness, no swelling, no edema, no deformity, no laceration, no pain, no spasm and normal pulse.  Lymphadenopathy:    He has no cervical adenopathy.  Neurological: He is alert and oriented to person, place, and time. He has normal strength. He displays no atrophy. No cranial nerve deficit or sensory deficit. He exhibits normal muscle tone. He displays a negative Romberg sign. He displays no seizure activity. Coordination and gait normal.  Neg SLR in BLE  Skin: Skin is warm and dry. No rash noted. He is not diaphoretic. No erythema. No pallor.  Psychiatric: He  has a normal mood and affect. His behavior is normal. Judgment and thought  content normal.    Lab Results  Component Value Date   WBC 8.2 01/03/2013   HGB 15.1 01/03/2013   HCT 44.5 01/03/2013   PLT 116.0* 01/03/2013   GLUCOSE 90 12/27/2012   CHOL 177 06/03/2012   TRIG 184.0* 06/03/2012   HDL 40.40 06/03/2012   LDLDIRECT 97.3 09/21/2010   LDLCALC 100* 06/03/2012   ALT 37 12/27/2012   AST 28 12/27/2012   NA 141 12/27/2012   K 3.5 12/27/2012   CL 104 12/27/2012   CREATININE 1.0 12/27/2012   BUN 8 12/27/2012   CO2 30 12/27/2012   TSH 2.37 09/21/2010        Assessment & Plan:

## 2013-07-11 NOTE — Patient Instructions (Signed)
Back Pain, Adult Low back pain is very common. About 1 in 5 people have back pain.The cause of low back pain is rarely dangerous. The pain often gets better over time.About half of people with a sudden onset of back pain feel better in just 2 weeks. About 8 in 10 people feel better by 6 weeks.  CAUSES Some common causes of back pain include:  Strain of the muscles or ligaments supporting the spine.  Wear and tear (degeneration) of the spinal discs.  Arthritis.  Direct injury to the back. DIAGNOSIS Most of the time, the direct cause of low back pain is not known.However, back pain can be treated effectively even when the exact cause of the pain is unknown.Answering your caregiver's questions about your overall health and symptoms is one of the most accurate ways to make sure the cause of your pain is not dangerous. If your caregiver needs more information, he or she may order lab work or imaging tests (X-rays or MRIs).However, even if imaging tests show changes in your back, this usually does not require surgery. HOME CARE INSTRUCTIONS For many people, back pain returns.Since low back pain is rarely dangerous, it is often a condition that people can learn to manageon their own.   Remain active. It is stressful on the back to sit or stand in one place. Do not sit, drive, or stand in one place for more than 30 minutes at a time. Take short walks on level surfaces as soon as pain allows.Try to increase the length of time you walk each day.  Do not stay in bed.Resting more than 1 or 2 days can delay your recovery.  Do not avoid exercise or work.Your body is made to move.It is not dangerous to be active, even though your back may hurt.Your back will likely heal faster if you return to being active before your pain is gone.  Pay attention to your body when you bend and lift. Many people have less discomfortwhen lifting if they bend their knees, keep the load close to their bodies,and  avoid twisting. Often, the most comfortable positions are those that put less stress on your recovering back.  Find a comfortable position to sleep. Use a firm mattress and lie on your side with your knees slightly bent. If you lie on your back, put a pillow under your knees.  Only take over-the-counter or prescription medicines as directed by your caregiver. Over-the-counter medicines to reduce pain and inflammation are often the most helpful.Your caregiver may prescribe muscle relaxant drugs.These medicines help dull your pain so you can more quickly return to your normal activities and healthy exercise.  Put ice on the injured area.  Put ice in a plastic bag.  Place a towel between your skin and the bag.  Leave the ice on for 15-20 minutes, 03-04 times a day for the first 2 to 3 days. After that, ice and heat may be alternated to reduce pain and spasms.  Ask your caregiver about trying back exercises and gentle massage. This may be of some benefit.  Avoid feeling anxious or stressed.Stress increases muscle tension and can worsen back pain.It is important to recognize when you are anxious or stressed and learn ways to manage it.Exercise is a great option. SEEK MEDICAL CARE IF:  You have pain that is not relieved with rest or medicine.  You have pain that does not improve in 1 week.  You have new symptoms.  You are generally not feeling well. SEEK   IMMEDIATE MEDICAL CARE IF:   You have pain that radiates from your back into your legs.  You develop new bowel or bladder control problems.  You have unusual weakness or numbness in your arms or legs.  You develop nausea or vomiting.  You develop abdominal pain.  You feel faint. Document Released: 05/22/2005 Document Revised: 11/21/2011 Document Reviewed: 10/10/2010 [employer] Patient Information 2014 [employer].  

## 2013-07-13 NOTE — Assessment & Plan Note (Signed)
Improvement noted 

## 2013-07-13 NOTE — Assessment & Plan Note (Signed)
His BP is well controlled 

## 2013-07-13 NOTE — Assessment & Plan Note (Signed)
I will recheck his lipids today He will work on his lifestyle modifications

## 2013-07-13 NOTE — Assessment & Plan Note (Signed)
Will con the current meds for pain relief

## 2013-07-13 NOTE — Assessment & Plan Note (Signed)
I will recheck his CBC today 

## 2013-07-14 LAB — LIPID PANEL
CHOLESTEROL: 183 mg/dL (ref 0–200)
HDL: 38.4 mg/dL — ABNORMAL LOW (ref >39.00)
Total CHOL/HDL Ratio: 5
Triglycerides: 214 mg/dL — ABNORMAL HIGH (ref 0.0–149.0)
VLDL: 42.8 mg/dL — AB (ref 0.0–40.0)

## 2013-07-14 LAB — BASIC METABOLIC PANEL
BUN: 9 mg/dL (ref 6–23)
CALCIUM: 9.2 mg/dL (ref 8.4–10.5)
CO2: 31 mEq/L (ref 19–32)
CREATININE: 1 mg/dL (ref 0.4–1.5)
Chloride: 105 mEq/L (ref 96–112)
GFR: 88.34 mL/min (ref >60.00)
Glucose, Bld: 61 mg/dL — ABNORMAL LOW (ref 70–99)
POTASSIUM: 4.2 meq/L (ref 3.5–5.1)
Sodium: 140 mEq/L (ref 135–145)

## 2013-07-14 LAB — LDL CHOLESTEROL, DIRECT: Direct LDL: 119.1 mg/dL

## 2013-07-15 ENCOUNTER — Encounter: Payer: Self-pay | Admitting: Internal Medicine

## 2013-07-18 ENCOUNTER — Encounter: Payer: Self-pay | Admitting: Internal Medicine

## 2013-07-18 ENCOUNTER — Other Ambulatory Visit: Payer: Self-pay | Admitting: Internal Medicine

## 2013-08-07 ENCOUNTER — Encounter: Payer: Self-pay | Admitting: Internal Medicine

## 2013-10-10 ENCOUNTER — Ambulatory Visit (INDEPENDENT_AMBULATORY_CARE_PROVIDER_SITE_OTHER): Payer: BC Managed Care – PPO | Admitting: Internal Medicine

## 2013-10-10 ENCOUNTER — Encounter: Payer: Self-pay | Admitting: Internal Medicine

## 2013-10-10 VITALS — BP 138/84 | HR 77 | Temp 97.4°F | Resp 16 | Ht 69.0 in | Wt 243.0 lb

## 2013-10-10 DIAGNOSIS — M5137 Other intervertebral disc degeneration, lumbosacral region: Secondary | ICD-10-CM

## 2013-10-10 DIAGNOSIS — M545 Low back pain, unspecified: Secondary | ICD-10-CM

## 2013-10-10 DIAGNOSIS — I10 Essential (primary) hypertension: Secondary | ICD-10-CM

## 2013-10-10 DIAGNOSIS — M5136 Other intervertebral disc degeneration, lumbar region: Secondary | ICD-10-CM

## 2013-10-10 MED ORDER — OXYCODONE-ACETAMINOPHEN 10-325 MG PO TABS: 1.0000 | ORAL_TABLET | Freq: Four times a day (QID) | ORAL | Status: DC | PRN

## 2013-10-10 NOTE — Progress Notes (Signed)
Pre visit review using our clinic review tool, if applicable. No additional management support is needed unless otherwise documented below in the visit note. 

## 2013-10-10 NOTE — Patient Instructions (Signed)
Back Pain, Adult Low back pain is very common. About 1 in 5 people have back pain.The cause of low back pain is rarely dangerous. The pain often gets better over time.About half of people with a sudden onset of back pain feel better in just 2 weeks. About 8 in 10 people feel better by 6 weeks.  CAUSES Some common causes of back pain include:  Strain of the muscles or ligaments supporting the spine.  Wear and tear (degeneration) of the spinal discs.  Arthritis.  Direct injury to the back. DIAGNOSIS Most of the time, the direct cause of low back pain is not known.However, back pain can be treated effectively even when the exact cause of the pain is unknown.Answering your caregiver's questions about your overall health and symptoms is one of the most accurate ways to make sure the cause of your pain is not dangerous. If your caregiver needs more information, he or she may order lab work or imaging tests (X-rays or MRIs).However, even if imaging tests show changes in your back, this usually does not require surgery. HOME CARE INSTRUCTIONS For many people, back pain returns.Since low back pain is rarely dangerous, it is often a condition that people can learn to manageon their own.   Remain active. It is stressful on the back to sit or stand in one place. Do not sit, drive, or stand in one place for more than 30 minutes at a time. Take short walks on level surfaces as soon as pain allows.Try to increase the length of time you walk each day.  Do not stay in bed.Resting more than 1 or 2 days can delay your recovery.  Do not avoid exercise or work.Your body is made to move.It is not dangerous to be active, even though your back may hurt.Your back will likely heal faster if you return to being active before your pain is gone.  Pay attention to your body when you bend and lift. Many people have less discomfortwhen lifting if they bend their knees, keep the load close to their bodies,and  avoid twisting. Often, the most comfortable positions are those that put less stress on your recovering back.  Find a comfortable position to sleep. Use a firm mattress and lie on your side with your knees slightly bent. If you lie on your back, put a pillow under your knees.  Only take over-the-counter or prescription medicines as directed by your caregiver. Over-the-counter medicines to reduce pain and inflammation are often the most helpful.Your caregiver may prescribe muscle relaxant drugs.These medicines help dull your pain so you can more quickly return to your normal activities and healthy exercise.  Put ice on the injured area.  Put ice in a plastic bag.  Place a towel between your skin and the bag.  Leave the ice on for 15-20 minutes, 03-04 times a day for the first 2 to 3 days. After that, ice and heat may be alternated to reduce pain and spasms.  Ask your caregiver about trying back exercises and gentle massage. This may be of some benefit.  Avoid feeling anxious or stressed.Stress increases muscle tension and can worsen back pain.It is important to recognize when you are anxious or stressed and learn ways to manage it.Exercise is a great option. SEEK MEDICAL CARE IF:  You have pain that is not relieved with rest or medicine.  You have pain that does not improve in 1 week.  You have new symptoms.  You are generally not feeling well. SEEK   IMMEDIATE MEDICAL CARE IF:   You have pain that radiates from your back into your legs.  You develop new bowel or bladder control problems.  You have unusual weakness or numbness in your arms or legs.  You develop nausea or vomiting.  You develop abdominal pain.  You feel faint. Document Released: 05/22/2005 Document Revised: 11/21/2011 Document Reviewed: 10/10/2010 [employer] Patient Information 2014 [employer].  

## 2013-10-12 NOTE — Assessment & Plan Note (Signed)
Cont percocet as needed 

## 2013-10-12 NOTE — Assessment & Plan Note (Signed)
I will prescribe percocet again but if he continues to be negative for this on the UDS then I will cease prescribing it

## 2013-10-12 NOTE — Progress Notes (Signed)
Subjective:    Patient ID: Brandon Lane, male    DOB: [birth year], 48 y.o.   MRN: [id number]  HPI Comments: His last UDS was negative for oxycodone, he tells me that the test was done when he had been out of percocet for several weeks. He works in a factory and tells me that he can't get thru the day without taking percocet for the pain.  Back Pain This is a chronic problem. The current episode started more than 1 year ago. The problem occurs constantly. The problem is unchanged. The pain is present in the lumbar spine. The quality of the pain is described as aching and stabbing. The pain does not radiate. The pain is at a severity of 4/10. The pain is moderate. The pain is worse during the day. The symptoms are aggravated by bending, position and twisting. Pertinent negatives include no abdominal pain, bladder incontinence, bowel incontinence, chest pain, dysuria, fever, headaches, leg pain, numbness, paresis, paresthesias, pelvic pain, perianal numbness, tingling, weakness or weight loss. Risk factors include obesity and lack of exercise. He has tried analgesics and NSAIDs for the symptoms. The treatment provided moderate relief.      Review of Systems  Constitutional: Negative for fever and weight loss.  Cardiovascular: Negative for chest pain.  Gastrointestinal: Negative for abdominal pain and bowel incontinence.  Genitourinary: Negative for bladder incontinence, dysuria and pelvic pain.  Musculoskeletal: Positive for back pain.  Neurological: Negative for tingling, weakness, numbness, headaches and paresthesias.  All other systems reviewed and are negative.      Objective:   Physical Exam  Vitals reviewed. Constitutional: He is oriented to person, place, and time. He appears well-developed and well-nourished. No distress.  HENT:  Head: Normocephalic and atraumatic.  Mouth/Throat: Oropharynx is clear and moist. No oropharyngeal exudate.  Eyes: Conjunctivae are normal. Right eye  exhibits no discharge. Left eye exhibits no discharge. No scleral icterus.  Neck: Normal range of motion. Neck supple. No JVD present. No tracheal deviation present. No thyromegaly present.  Cardiovascular: Normal rate, regular rhythm, normal heart sounds and intact distal pulses.  Exam reveals no gallop and no friction rub.   No murmur heard. Pulmonary/Chest: Effort normal and breath sounds normal. No stridor. No respiratory distress. He has no wheezes. He has no rales. He exhibits no tenderness.  Abdominal: Soft. Bowel sounds are normal. He exhibits no distension and no mass. There is no tenderness. There is no rebound and no guarding.  Musculoskeletal: Normal range of motion. He exhibits no edema and no tenderness.       Lumbar back: Normal.  Lymphadenopathy:    He has no cervical adenopathy.  Neurological: He is oriented to person, place, and time.  Reflex Scores:      Brachioradialis reflexes are 1+ on the right side and 1+ on the left side.      Patellar reflexes are 1+ on the right side and 1+ on the left side.      Achilles reflexes are 1+ on the right side and 1+ on the left side. Neg SLR in BLE  Skin: Skin is warm and dry. No rash noted. He is not diaphoretic. No erythema. No pallor.     Lab Results  Component Value Date   WBC 7.2 07/11/2013   HGB 15.1 07/11/2013   HCT 44.5 07/11/2013   PLT 127.0* 07/11/2013   GLUCOSE 61* 07/11/2013   CHOL 183 07/11/2013   TRIG 214.0* 07/11/2013   HDL 38.40* 07/11/2013   LDLDIRECT 119.1  07/11/2013   LDLCALC 100* 06/03/2012   ALT 37 12/27/2012   AST 28 12/27/2012   NA 140 07/11/2013   K 4.2 07/11/2013   CL 105 07/11/2013   CREATININE 1.0 07/11/2013   BUN 9 07/11/2013   CO2 31 07/11/2013   TSH 2.37 09/21/2010       Assessment & Plan:

## 2013-10-12 NOTE — Assessment & Plan Note (Signed)
His BP is well controlled 

## 2013-10-30 ENCOUNTER — Ambulatory Visit: Payer: BC Managed Care – PPO | Admitting: Adult Health

## 2014-01-16 ENCOUNTER — Ambulatory Visit (INDEPENDENT_AMBULATORY_CARE_PROVIDER_SITE_OTHER): Payer: BC Managed Care – PPO | Admitting: Internal Medicine

## 2014-01-16 ENCOUNTER — Encounter: Payer: Self-pay | Admitting: Internal Medicine

## 2014-01-16 VITALS — BP 130/90 | HR 69 | Temp 98.0°F | Resp 16 | Ht 69.0 in | Wt 239.8 lb

## 2014-01-16 DIAGNOSIS — M5136 Other intervertebral disc degeneration, lumbar region: Secondary | ICD-10-CM

## 2014-01-16 DIAGNOSIS — M51369 Other intervertebral disc degeneration, lumbar region without mention of lumbar back pain or lower extremity pain: Secondary | ICD-10-CM

## 2014-01-16 DIAGNOSIS — I10 Essential (primary) hypertension: Secondary | ICD-10-CM

## 2014-01-16 DIAGNOSIS — M545 Low back pain, unspecified: Secondary | ICD-10-CM

## 2014-01-16 DIAGNOSIS — M5137 Other intervertebral disc degeneration, lumbosacral region: Secondary | ICD-10-CM

## 2014-01-16 MED ORDER — OXYCODONE-ACETAMINOPHEN 10-325 MG PO TABS: 1.0000 | ORAL_TABLET | Freq: Four times a day (QID) | ORAL | Status: DC | PRN

## 2014-01-16 NOTE — Progress Notes (Signed)
Subjective:    Patient ID: Brandon DresserMartin Lane, male    DOB: September 04, 1965, 48 y.o.   MRN: [id number]  HPI Comments: He complains that his LBP is worsening and he wants to have an MRI done.  Back Pain This is a recurrent problem. The current episode started more than 1 year ago. The problem occurs constantly. The problem has been gradually worsening since onset. The pain is present in the lumbar spine. The quality of the pain is described as aching. The pain does not radiate. The pain is at a severity of 6/10. The pain is severe. The pain is worse during the day. The symptoms are aggravated by bending, position and standing. Stiffness is present all day. Pertinent negatives include no abdominal pain, bladder incontinence, bowel incontinence, chest pain, dysuria, fever, headaches, leg pain, numbness, paresis, paresthesias, pelvic pain, perianal numbness, tingling, weakness or weight loss. Risk factors include obesity and lack of exercise. He has tried NSAIDs and analgesics for the symptoms. The treatment provided moderate relief.      Review of Systems  Constitutional: Negative.  Negative for fever and weight loss.  HENT: Negative.   Eyes: Negative.   Respiratory: Negative.  Negative for choking, chest tightness, shortness of breath and stridor.   Cardiovascular: Negative.  Negative for chest pain, palpitations and leg swelling.  Gastrointestinal: Negative.  Negative for nausea, abdominal pain, diarrhea, constipation and bowel incontinence.  Endocrine: Negative.   Genitourinary: Negative.  Negative for bladder incontinence, dysuria and pelvic pain.  Musculoskeletal: Positive for back pain. Negative for arthralgias.  Skin: Negative.   Allergic/Immunologic: Negative.   Neurological: Negative.  Negative for tingling, weakness, numbness, headaches and paresthesias.  Hematological: Negative.  Negative for adenopathy. Does not bruise/bleed easily.  Psychiatric/Behavioral: Negative.   All other systems  reviewed and are negative.      Objective:   Physical Exam  Vitals reviewed. Constitutional: He is oriented to person, place, and time. He appears well-developed and well-nourished. No distress.  HENT:  Head: Normocephalic and atraumatic.  Mouth/Throat: Oropharynx is clear and moist. No oropharyngeal exudate.  Eyes: Conjunctivae are normal. Right eye exhibits no discharge. Left eye exhibits no discharge. No scleral icterus.  Neck: Normal range of motion. Neck supple. No JVD present. No tracheal deviation present. No thyromegaly present.  Cardiovascular: Normal rate, regular rhythm, normal heart sounds and intact distal pulses.  Exam reveals no gallop and no friction rub.   No murmur heard. Pulmonary/Chest: Effort normal and breath sounds normal. No stridor. No respiratory distress. He has no wheezes. He has no rales. He exhibits no tenderness.  Abdominal: Soft. Bowel sounds are normal. He exhibits no distension and no mass. There is no tenderness. There is no rebound and no guarding.  Musculoskeletal: Normal range of motion. He exhibits no edema and no tenderness.       Lumbar back: Normal. He exhibits normal range of motion, no tenderness, no bony tenderness, no edema and no deformity.  Lymphadenopathy:    He has no cervical adenopathy.  Neurological: He is alert and oriented to person, place, and time. He has normal strength. He displays no atrophy, no tremor and normal reflexes. No cranial nerve deficit or sensory deficit. He exhibits normal muscle tone. He displays a negative Romberg sign. He displays no seizure activity. Coordination and gait normal.  Reflex Scores:      Tricep reflexes are 1+ on the right side and 1+ on the left side.      Bicep reflexes are 1+ on  the right side and 1+ on the left side.      Brachioradialis reflexes are 1+ on the right side and 1+ on the left side.      Patellar reflexes are 1+ on the right side and 1+ on the left side.      Achilles reflexes are 1+  on the right side and 1+ on the left side. Neg SLR in BLE  Skin: Skin is warm and dry. No rash noted. He is not diaphoretic. No erythema. No pallor.     Lab Results  Component Value Date   WBC 7.2 07/11/2013   HGB 15.1 07/11/2013   HCT 44.5 07/11/2013   PLT 127.0* 07/11/2013   GLUCOSE 61* 07/11/2013   CHOL 183 07/11/2013   TRIG 214.0* 07/11/2013   HDL 38.40* 07/11/2013   LDLDIRECT 119.1 07/11/2013   LDLCALC 100* 06/03/2012   ALT 37 12/27/2012   AST 28 12/27/2012   NA 140 07/11/2013   K 4.2 07/11/2013   CL 105 07/11/2013   CREATININE 1.0 07/11/2013   BUN 9 07/11/2013   CO2 31 07/11/2013   TSH 2.37 09/21/2010       Assessment & Plan:

## 2014-01-16 NOTE — Patient Instructions (Signed)
Back Pain, Adult Low back pain is very common. About 1 in 5 people have back pain.The cause of low back pain is rarely dangerous. The pain often gets better over time.About half of people with a sudden onset of back pain feel better in just 2 weeks. About 8 in 10 people feel better by 6 weeks.  CAUSES Some common causes of back pain include:  Strain of the muscles or ligaments supporting the spine.  Wear and tear (degeneration) of the spinal discs.  Arthritis.  Direct injury to the back. DIAGNOSIS Most of the time, the direct cause of low back pain is not known.However, back pain can be treated effectively even when the exact cause of the pain is unknown.Answering your caregiver's questions about your overall health and symptoms is one of the most accurate ways to make sure the cause of your pain is not dangerous. If your caregiver needs more information, he or she may order lab work or imaging tests (X-rays or MRIs).However, even if imaging tests show changes in your back, this usually does not require surgery. HOME CARE INSTRUCTIONS For many people, back pain returns.Since low back pain is rarely dangerous, it is often a condition that people can learn to manageon their own.   Remain active. It is stressful on the back to sit or stand in one place. Do not sit, drive, or stand in one place for more than 30 minutes at a time. Take short walks on level surfaces as soon as pain allows.Try to increase the length of time you walk each day.  Do not stay in bed.Resting more than 1 or 2 days can delay your recovery.  Do not avoid exercise or work.Your body is made to move.It is not dangerous to be active, even though your back may hurt.Your back will likely heal faster if you return to being active before your pain is gone.  Pay attention to your body when you bend and lift. Many people have less discomfortwhen lifting if they bend their knees, keep the load close to their bodies,and  avoid twisting. Often, the most comfortable positions are those that put less stress on your recovering back.  Find a comfortable position to sleep. Use a firm mattress and lie on your side with your knees slightly bent. If you lie on your back, put a pillow under your knees.  Only take over-the-counter or prescription medicines as directed by your caregiver. Over-the-counter medicines to reduce pain and inflammation are often the most helpful.Your caregiver may prescribe muscle relaxant drugs.These medicines help dull your pain so you can more quickly return to your normal activities and healthy exercise.  Put ice on the injured area.  Put ice in a plastic bag.  Place a towel between your skin and the bag.  Leave the ice on for 15-20 minutes, 03-04 times a day for the first 2 to 3 days. After that, ice and heat may be alternated to reduce pain and spasms.  Ask your caregiver about trying back exercises and gentle massage. This may be of some benefit.  Avoid feeling anxious or stressed.Stress increases muscle tension and can worsen back pain.It is important to recognize when you are anxious or stressed and learn ways to manage it.Exercise is a great option. SEEK MEDICAL CARE IF:  You have pain that is not relieved with rest or medicine.  You have pain that does not improve in 1 week.  You have new symptoms.  You are generally not feeling well. SEEK   IMMEDIATE MEDICAL CARE IF:   You have pain that radiates from your back into your legs.  You develop new bowel or bladder control problems.  You have unusual weakness or numbness in your arms or legs.  You develop nausea or vomiting.  You develop abdominal pain.  You feel faint. Document Released: 05/22/2005 Document Revised: 11/21/2011 Document Reviewed: 09/23/2013 [employer] Patient Information 2015 [employer]. This information is not intended to replace advice given to you by your health care provider. Make sure you  discuss any questions you have with your health care provider.  

## 2014-01-16 NOTE — Progress Notes (Signed)
Pre visit review using our clinic review tool, if applicable. No additional management support is needed unless otherwise documented below in the visit note. 

## 2014-01-18 NOTE — Assessment & Plan Note (Signed)
He has no danger s/s today Will get an MRI done to see if he has developed spinal stenosis, nerve impingement, etc He will cont the same meds for now

## 2014-01-18 NOTE — Assessment & Plan Note (Signed)
His BP is well controlled 

## 2014-01-31 ENCOUNTER — Ambulatory Visit
Admission: RE | Admit: 2014-01-31 | Discharge: 2014-01-31 | Disposition: A | Discharge status: Home / Self Care | Payer: BC Managed Care – PPO | Source: Ambulatory Visit | Attending: Internal Medicine | Admitting: Internal Medicine

## 2014-01-31 DIAGNOSIS — M545 Low back pain, unspecified: Secondary | ICD-10-CM

## 2014-01-31 DIAGNOSIS — M5136 Other intervertebral disc degeneration, lumbar region: Secondary | ICD-10-CM

## 2014-02-02 ENCOUNTER — Other Ambulatory Visit: Payer: Self-pay | Admitting: Internal Medicine

## 2014-02-02 ENCOUNTER — Encounter: Payer: Self-pay | Admitting: Internal Medicine

## 2014-02-02 DIAGNOSIS — M48061 Spinal stenosis, lumbar region without neurogenic claudication: Secondary | ICD-10-CM | POA: Insufficient documentation

## 2014-04-17 ENCOUNTER — Ambulatory Visit (INDEPENDENT_AMBULATORY_CARE_PROVIDER_SITE_OTHER): Payer: BC Managed Care – PPO | Admitting: Internal Medicine

## 2014-04-17 ENCOUNTER — Encounter: Payer: Self-pay | Admitting: Internal Medicine

## 2014-04-17 VITALS — BP 138/82 | HR 63 | Temp 98.1°F | Resp 16 | Ht 69.0 in | Wt 245.8 lb

## 2014-04-17 DIAGNOSIS — M545 Low back pain, unspecified: Secondary | ICD-10-CM

## 2014-04-17 DIAGNOSIS — M48061 Spinal stenosis, lumbar region without neurogenic claudication: Secondary | ICD-10-CM

## 2014-04-17 DIAGNOSIS — I10 Essential (primary) hypertension: Secondary | ICD-10-CM

## 2014-04-17 DIAGNOSIS — M5136 Other intervertebral disc degeneration, lumbar region: Secondary | ICD-10-CM

## 2014-04-17 DIAGNOSIS — M4806 Spinal stenosis, lumbar region: Secondary | ICD-10-CM

## 2014-04-17 MED ORDER — OLMESARTAN MEDOXOMIL 40 MG PO TABS: 40.0000 mg | ORAL_TABLET | Freq: Every day | ORAL | Status: DC

## 2014-04-17 MED ORDER — OXYCODONE-ACETAMINOPHEN 10-325 MG PO TABS: 1.0000 | ORAL_TABLET | Freq: Four times a day (QID) | ORAL | Status: DC | PRN

## 2014-04-17 NOTE — Progress Notes (Signed)
   Subjective:    Patient ID: Brandon Lane, male    DOB: 11-19-1965, 48 y.o.   MRN: [id number]  Hypertension This is a chronic problem. The current episode started more than 1 year ago. The problem is unchanged. The problem is controlled. Pertinent negatives include no anxiety, blurred vision, chest pain, headaches, malaise/fatigue, neck pain, orthopnea, palpitations, peripheral edema, PND, shortness of breath or sweats. Agents associated with hypertension include NSAIDs. Past treatments include angiotensin blockers. The current treatment provides moderate improvement. Compliance problems include diet and exercise.       Review of Systems  Constitutional: Negative.  Negative for fever, chills, malaise/fatigue, diaphoresis, appetite change and fatigue.  HENT: Negative.   Eyes: Negative.  Negative for blurred vision.  Respiratory: Negative.  Negative for choking, chest tightness, shortness of breath and stridor.   Cardiovascular: Negative.  Negative for chest pain, palpitations, orthopnea, leg swelling and PND.  Gastrointestinal: Negative.  Negative for nausea, vomiting, abdominal pain, diarrhea and constipation.  Endocrine: Negative.   Genitourinary: Negative.  Negative for dysuria.  Musculoskeletal: Positive for back pain and arthralgias. Negative for myalgias, joint swelling, gait problem, neck pain and neck stiffness.  Skin: Negative.  Negative for rash.  Allergic/Immunologic: Negative.   Neurological: Negative.  Negative for headaches.  Hematological: Negative.  Negative for adenopathy. Does not bruise/bleed easily.  Psychiatric/Behavioral: Negative.        Objective:   Physical Exam  Constitutional: He is oriented to person, place, and time. He appears well-developed and well-nourished. No distress.  HENT:  Head: Normocephalic and atraumatic.  Mouth/Throat: Oropharynx is clear and moist. No oropharyngeal exudate.  Eyes: Conjunctivae are normal. Right eye exhibits no discharge.  Left eye exhibits no discharge. No scleral icterus.  Neck: Normal range of motion. Neck supple. No JVD present. No tracheal deviation present. No thyromegaly present.  Cardiovascular: Normal rate, regular rhythm, normal heart sounds and intact distal pulses.  Exam reveals no gallop and no friction rub.   No murmur heard. Pulmonary/Chest: Effort normal and breath sounds normal. No stridor. No respiratory distress. He has no wheezes. He has no rales. He exhibits no tenderness.  Abdominal: Soft. Bowel sounds are normal. He exhibits no distension and no mass. There is no tenderness. There is no rebound and no guarding.  Musculoskeletal: Normal range of motion. He exhibits no edema or tenderness.  Lymphadenopathy:    He has no cervical adenopathy.  Neurological: He is alert and oriented to person, place, and time. He has normal reflexes. He displays normal reflexes. He exhibits normal muscle tone. Coordination normal.  Neg SLR in BLE  Skin: Skin is warm and dry. No rash noted. He is not diaphoretic. No erythema. No pallor.  Vitals reviewed.    Lab Results  Component Value Date   WBC 7.2 07/11/2013   HGB 15.1 07/11/2013   HCT 44.5 07/11/2013   PLT 127.0* 07/11/2013   GLUCOSE 61* 07/11/2013   CHOL 183 07/11/2013   TRIG 214.0* 07/11/2013   HDL 38.40* 07/11/2013   LDLDIRECT 119.1 07/11/2013   LDLCALC 100* 06/03/2012   ALT 37 12/27/2012   AST 28 12/27/2012   NA 140 07/11/2013   K 4.2 07/11/2013   CL 105 07/11/2013   CREATININE 1.0 07/11/2013   BUN 9 07/11/2013   CO2 31 07/11/2013   TSH 2.37 09/21/2010       Assessment & Plan:

## 2014-04-17 NOTE — Patient Instructions (Signed)
Back Pain, Adult Low back pain is very common. About 1 in 5 people have back pain.The cause of low back pain is rarely dangerous. The pain often gets better over time.About half of people with a sudden onset of back pain feel better in just 2 weeks. About 8 in 10 people feel better by 6 weeks.  CAUSES Some common causes of back pain include:  Strain of the muscles or ligaments supporting the spine.  Wear and tear (degeneration) of the spinal discs.  Arthritis.  Direct injury to the back. DIAGNOSIS Most of the time, the direct cause of low back pain is not known.However, back pain can be treated effectively even when the exact cause of the pain is unknown.Answering your caregiver's questions about your overall health and symptoms is one of the most accurate ways to make sure the cause of your pain is not dangerous. If your caregiver needs more information, he or she may order lab work or imaging tests (X-rays or MRIs).However, even if imaging tests show changes in your back, this usually does not require surgery. HOME CARE INSTRUCTIONS For many people, back pain returns.Since low back pain is rarely dangerous, it is often a condition that people can learn to manageon their own.   Remain active. It is stressful on the back to sit or stand in one place. Do not sit, drive, or stand in one place for more than 30 minutes at a time. Take short walks on level surfaces as soon as pain allows.Try to increase the length of time you walk each day.  Do not stay in bed.Resting more than 1 or 2 days can delay your recovery.  Do not avoid exercise or work.Your body is made to move.It is not dangerous to be active, even though your back may hurt.Your back will likely heal faster if you return to being active before your pain is gone.  Pay attention to your body when you bend and lift. Many people have less discomfortwhen lifting if they bend their knees, keep the load close to their bodies,and  avoid twisting. Often, the most comfortable positions are those that put less stress on your recovering back.  Find a comfortable position to sleep. Use a firm mattress and lie on your side with your knees slightly bent. If you lie on your back, put a pillow under your knees.  Only take over-the-counter or prescription medicines as directed by your caregiver. Over-the-counter medicines to reduce pain and inflammation are often the most helpful.Your caregiver may prescribe muscle relaxant drugs.These medicines help dull your pain so you can more quickly return to your normal activities and healthy exercise.  Put ice on the injured area.  Put ice in a plastic bag.  Place a towel between your skin and the bag.  Leave the ice on for 15-20 minutes, 03-04 times a day for the first 2 to 3 days. After that, ice and heat may be alternated to reduce pain and spasms.  Ask your caregiver about trying back exercises and gentle massage. This may be of some benefit.  Avoid feeling anxious or stressed.Stress increases muscle tension and can worsen back pain.It is important to recognize when you are anxious or stressed and learn ways to manage it.Exercise is a great option. SEEK MEDICAL CARE IF:  You have pain that is not relieved with rest or medicine.  You have pain that does not improve in 1 week.  You have new symptoms.  You are generally not feeling well. SEEK   IMMEDIATE MEDICAL CARE IF:   You have pain that radiates from your back into your legs.  You develop new bowel or bladder control problems.  You have unusual weakness or numbness in your arms or legs.  You develop nausea or vomiting.  You develop abdominal pain.  You feel faint. Document Released: 05/22/2005 Document Revised: 11/21/2011 Document Reviewed: 09/23/2013 [employer] Patient Information 2015 [employer]. This information is not intended to replace advice given to you by your health care provider. Make sure you  discuss any questions you have with your health care provider.  

## 2014-04-17 NOTE — Progress Notes (Signed)
Pre visit review using our clinic review tool, if applicable. No additional management support is needed unless otherwise documented below in the visit note. 

## 2014-04-19 NOTE — Assessment & Plan Note (Signed)
His BP is well controlled I have asked him to work on his lifestyle modifications

## 2014-04-19 NOTE — Assessment & Plan Note (Signed)
He is seeing NS about this For now, he will cont taking percocet as needed

## 2014-04-23 ENCOUNTER — Telehealth: Payer: Self-pay | Admitting: Internal Medicine

## 2014-04-23 NOTE — Telephone Encounter (Signed)
emmi emailed °

## 2014-07-01 ENCOUNTER — Ambulatory Visit: Payer: Self-pay | Admitting: Internal Medicine

## 2014-07-01 ENCOUNTER — Encounter: Payer: Self-pay | Admitting: Internal Medicine

## 2014-07-01 ENCOUNTER — Ambulatory Visit (INDEPENDENT_AMBULATORY_CARE_PROVIDER_SITE_OTHER): Payer: BLUE CROSS/BLUE SHIELD | Admitting: Internal Medicine

## 2014-07-01 VITALS — BP 134/82 | HR 65 | Temp 97.9°F | Ht 69.0 in | Wt 247.5 lb

## 2014-07-01 DIAGNOSIS — M5136 Other intervertebral disc degeneration, lumbar region: Secondary | ICD-10-CM

## 2014-07-01 DIAGNOSIS — M545 Low back pain, unspecified: Secondary | ICD-10-CM

## 2014-07-01 DIAGNOSIS — I10 Essential (primary) hypertension: Secondary | ICD-10-CM

## 2014-07-01 DIAGNOSIS — F32A Depression, unspecified: Secondary | ICD-10-CM

## 2014-07-01 DIAGNOSIS — F329 Major depressive disorder, single episode, unspecified: Secondary | ICD-10-CM

## 2014-07-01 MED ORDER — OXYCODONE-ACETAMINOPHEN 10-325 MG PO TABS: 1.0000 | ORAL_TABLET | Freq: Four times a day (QID) | ORAL | Status: DC | PRN

## 2014-07-01 MED ORDER — VENLAFAXINE HCL ER 150 MG PO CP24: 150.0000 mg | ORAL_CAPSULE | Freq: Every day | ORAL | Status: DC

## 2014-07-01 NOTE — Patient Instructions (Signed)
Back Pain, Adult Low back pain is very common. About 1 in 5 people have back pain.The cause of low back pain is rarely dangerous. The pain often gets better over time.About half of people with a sudden onset of back pain feel better in just 2 weeks. About 8 in 10 people feel better by 6 weeks.  CAUSES Some common causes of back pain include:  Strain of the muscles or ligaments supporting the spine.  Wear and tear (degeneration) of the spinal discs.  Arthritis.  Direct injury to the back. DIAGNOSIS Most of the time, the direct cause of low back pain is not known.However, back pain can be treated effectively even when the exact cause of the pain is unknown.Answering your caregiver's questions about your overall health and symptoms is one of the most accurate ways to make sure the cause of your pain is not dangerous. If your caregiver needs more information, he or she may order lab work or imaging tests (X-rays or MRIs).However, even if imaging tests show changes in your back, this usually does not require surgery. HOME CARE INSTRUCTIONS For many people, back pain returns.Since low back pain is rarely dangerous, it is often a condition that people can learn to manageon their own.   Remain active. It is stressful on the back to sit or stand in one place. Do not sit, drive, or stand in one place for more than 30 minutes at a time. Take short walks on level surfaces as soon as pain allows.Try to increase the length of time you walk each day.  Do not stay in bed.Resting more than 1 or 2 days can delay your recovery.  Do not avoid exercise or work.Your body is made to move.It is not dangerous to be active, even though your back may hurt.Your back will likely heal faster if you return to being active before your pain is gone.  Pay attention to your body when you bend and lift. Many people have less discomfortwhen lifting if they bend their knees, keep the load close to their bodies,and  avoid twisting. Often, the most comfortable positions are those that put less stress on your recovering back.  Find a comfortable position to sleep. Use a firm mattress and lie on your side with your knees slightly bent. If you lie on your back, put a pillow under your knees.  Only take over-the-counter or prescription medicines as directed by your caregiver. Over-the-counter medicines to reduce pain and inflammation are often the most helpful.Your caregiver may prescribe muscle relaxant drugs.These medicines help dull your pain so you can more quickly return to your normal activities and healthy exercise.  Put ice on the injured area.  Put ice in a plastic bag.  Place a towel between your skin and the bag.  Leave the ice on for 15-20 minutes, 03-04 times a day for the first 2 to 3 days. After that, ice and heat may be alternated to reduce pain and spasms.  Ask your caregiver about trying back exercises and gentle massage. This may be of some benefit.  Avoid feeling anxious or stressed.Stress increases muscle tension and can worsen back pain.It is important to recognize when you are anxious or stressed and learn ways to manage it.Exercise is a great option. SEEK MEDICAL CARE IF:  You have pain that is not relieved with rest or medicine.  You have pain that does not improve in 1 week.  You have new symptoms.  You are generally not feeling well. SEEK   IMMEDIATE MEDICAL CARE IF:   You have pain that radiates from your back into your legs.  You develop new bowel or bladder control problems.  You have unusual weakness or numbness in your arms or legs.  You develop nausea or vomiting.  You develop abdominal pain.  You feel faint. Document Released: 05/22/2005 Document Revised: 11/21/2011 Document Reviewed: 09/23/2013 [employer] Patient Information 2015 [employer]. This information is not intended to replace advice given to you by your health care provider. Make sure you  discuss any questions you have with your health care provider.  

## 2014-07-01 NOTE — Assessment & Plan Note (Signed)
He is getting adequate pain relief with percocet taken as needed He will cont to follow up with Dr. Ilean SkillEllsner

## 2014-07-01 NOTE — Progress Notes (Signed)
Subjective:    Patient ID: Brandon Lane, male    DOB: 07/26/1965, 49 y.o.   MRN: [id number]  Back Pain This is a chronic problem. The current episode started more than 1 year ago. The problem occurs constantly. The problem is unchanged. The pain is present in the lumbar spine. The quality of the pain is described as aching. The pain radiates to the right thigh and left thigh. The pain is at a severity of 4/10. The pain is moderate. The pain is worse during the day. Pertinent negatives include no abdominal pain, bladder incontinence, bowel incontinence, chest pain, dysuria, fever, headaches, leg pain, numbness, paresis, paresthesias, pelvic pain, perianal numbness, tingling, weakness or weight loss. Risk factors include obesity and lack of exercise. He has tried analgesics for the symptoms. The treatment provided moderate relief.      Review of Systems  Constitutional: Negative.  Negative for fever, chills, weight loss, diaphoresis, appetite change and fatigue.  HENT: Negative.   Eyes: Negative.   Respiratory: Negative.  Negative for cough, choking, chest tightness, shortness of breath and stridor.   Cardiovascular: Negative.  Negative for chest pain, palpitations and leg swelling.  Gastrointestinal: Negative.  Negative for nausea, vomiting, abdominal pain, diarrhea, constipation, blood in stool and bowel incontinence.  Endocrine: Negative.   Genitourinary: Negative.  Negative for bladder incontinence, dysuria and pelvic pain.  Musculoskeletal: Positive for back pain. Negative for arthralgias and gait problem.  Skin: Negative.   Allergic/Immunologic: Negative.   Neurological: Negative.  Negative for tingling, weakness, numbness, headaches and paresthesias.  Hematological: Negative.  Negative for adenopathy. Does not bruise/bleed easily.  Psychiatric/Behavioral: Positive for dysphoric mood. Negative for suicidal ideas, behavioral problems, sleep disturbance, decreased concentration and  agitation. The patient is not nervous/anxious.        Objective:   Physical Exam  Constitutional: He is oriented to person, place, and time. He appears well-developed and well-nourished. No distress.  HENT:  Head: Normocephalic and atraumatic.  Mouth/Throat: Oropharynx is clear and moist. No oropharyngeal exudate.  Eyes: Conjunctivae are normal. Right eye exhibits no discharge. Left eye exhibits no discharge. No scleral icterus.  Neck: Normal range of motion. Neck supple. No JVD present. No tracheal deviation present. No thyromegaly present.  Cardiovascular: Normal rate, regular rhythm, normal heart sounds and intact distal pulses.  Exam reveals no gallop and no friction rub.   No murmur heard. Pulmonary/Chest: Effort normal and breath sounds normal. No stridor. No respiratory distress. He has no wheezes. He has no rales. He exhibits no tenderness.  Abdominal: Soft. Bowel sounds are normal. He exhibits no distension and no mass. There is no tenderness. There is no rebound and no guarding.  Musculoskeletal: Normal range of motion. He exhibits no edema or tenderness.  Lymphadenopathy:    He has no cervical adenopathy.  Neurological: He is oriented to person, place, and time.  Skin: Skin is warm and dry. No rash noted. He is not diaphoretic. No erythema. No pallor.  Vitals reviewed.    Lab Results  Component Value Date   WBC 7.2 07/11/2013   HGB 15.1 07/11/2013   HCT 44.5 07/11/2013   PLT 127.0* 07/11/2013   GLUCOSE 61* 07/11/2013   CHOL 183 07/11/2013   TRIG 214.0* 07/11/2013   HDL 38.40* 07/11/2013   LDLDIRECT 119.1 07/11/2013   LDLCALC 100* 06/03/2012   ALT 37 12/27/2012   AST 28 12/27/2012   NA 140 07/11/2013   K 4.2 07/11/2013   CL 105 07/11/2013   CREATININE 1.0  07/11/2013   BUN 9 07/11/2013   CO2 31 07/11/2013   TSH 2.37 09/21/2010       Assessment & Plan:

## 2014-07-01 NOTE — Assessment & Plan Note (Signed)
His BP is well controlled 

## 2014-07-01 NOTE — Progress Notes (Signed)
Pre visit review using our clinic review tool, if applicable. No additional management support is needed unless otherwise documented below in the visit note. 

## 2014-10-19 ENCOUNTER — Ambulatory Visit: Payer: BLUE CROSS/BLUE SHIELD | Admitting: Internal Medicine

## 2014-10-19 DIAGNOSIS — Z0289 Encounter for other administrative examinations: Secondary | ICD-10-CM

## 2014-10-20 ENCOUNTER — Encounter: Payer: Self-pay | Admitting: Internal Medicine

## 2014-10-20 ENCOUNTER — Ambulatory Visit (INDEPENDENT_AMBULATORY_CARE_PROVIDER_SITE_OTHER): Payer: BLUE CROSS/BLUE SHIELD | Admitting: Internal Medicine

## 2014-10-20 VITALS — BP 126/82 | HR 63 | Temp 97.8°F | Resp 16 | Ht 69.0 in | Wt 252.0 lb

## 2014-10-20 DIAGNOSIS — M545 Low back pain, unspecified: Secondary | ICD-10-CM

## 2014-10-20 DIAGNOSIS — I10 Essential (primary) hypertension: Secondary | ICD-10-CM

## 2014-10-20 DIAGNOSIS — M5136 Other intervertebral disc degeneration, lumbar region: Secondary | ICD-10-CM

## 2014-10-20 MED ORDER — OXYCODONE-ACETAMINOPHEN 10-325 MG PO TABS: 1.0000 | ORAL_TABLET | Freq: Four times a day (QID) | ORAL | Status: DC | PRN

## 2014-10-20 MED ORDER — OLMESARTAN MEDOXOMIL 40 MG PO TABS: 40.0000 mg | ORAL_TABLET | Freq: Every day | ORAL | Status: DC

## 2014-10-20 NOTE — Progress Notes (Signed)
Pre visit review using our clinic review tool, if applicable. No additional management support is needed unless otherwise documented below in the visit note. 

## 2014-10-20 NOTE — Patient Instructions (Signed)
Back Pain, Adult Low back pain is very common. About 1 in 5 people have back pain.The cause of low back pain is rarely dangerous. The pain often gets better over time.About half of people with a sudden onset of back pain feel better in just 2 weeks. About 8 in 10 people feel better by 6 weeks.  CAUSES Some common causes of back pain include:  Strain of the muscles or ligaments supporting the spine.  Wear and tear (degeneration) of the spinal discs.  Arthritis.  Direct injury to the back. DIAGNOSIS Most of the time, the direct cause of low back pain is not known.However, back pain can be treated effectively even when the exact cause of the pain is unknown.Answering your caregiver's questions about your overall health and symptoms is one of the most accurate ways to make sure the cause of your pain is not dangerous. If your caregiver needs more information, he or she may order lab work or imaging tests (X-rays or MRIs).However, even if imaging tests show changes in your back, this usually does not require surgery. HOME CARE INSTRUCTIONS For many people, back pain returns.Since low back pain is rarely dangerous, it is often a condition that people can learn to manageon their own.   Remain active. It is stressful on the back to sit or stand in one place. Do not sit, drive, or stand in one place for more than 30 minutes at a time. Take short walks on level surfaces as soon as pain allows.Try to increase the length of time you walk each day.  Do not stay in bed.Resting more than 1 or 2 days can delay your recovery.  Do not avoid exercise or work.Your body is made to move.It is not dangerous to be active, even though your back may hurt.Your back will likely heal faster if you return to being active before your pain is gone.  Pay attention to your body when you bend and lift. Many people have less discomfortwhen lifting if they bend their knees, keep the load close to their bodies,and  avoid twisting. Often, the most comfortable positions are those that put less stress on your recovering back.  Find a comfortable position to sleep. Use a firm mattress and lie on your side with your knees slightly bent. If you lie on your back, put a pillow under your knees.  Only take over-the-counter or prescription medicines as directed by your caregiver. Over-the-counter medicines to reduce pain and inflammation are often the most helpful.Your caregiver may prescribe muscle relaxant drugs.These medicines help dull your pain so you can more quickly return to your normal activities and healthy exercise.  Put ice on the injured area.  Put ice in a plastic bag.  Place a towel between your skin and the bag.  Leave the ice on for 15-20 minutes, 03-04 times a day for the first 2 to 3 days. After that, ice and heat may be alternated to reduce pain and spasms.  Ask your caregiver about trying back exercises and gentle massage. This may be of some benefit.  Avoid feeling anxious or stressed.Stress increases muscle tension and can worsen back pain.It is important to recognize when you are anxious or stressed and learn ways to manage it.Exercise is a great option. SEEK MEDICAL CARE IF:  You have pain that is not relieved with rest or medicine.  You have pain that does not improve in 1 week.  You have new symptoms.  You are generally not feeling well. SEEK   IMMEDIATE MEDICAL CARE IF:   You have pain that radiates from your back into your legs.  You develop new bowel or bladder control problems.  You have unusual weakness or numbness in your arms or legs.  You develop nausea or vomiting.  You develop abdominal pain.  You feel faint. Document Released: 05/22/2005 Document Revised: 11/21/2011 Document Reviewed: 09/23/2013 [employer] Patient Information 2015 [employer]. This information is not intended to replace advice given to you by your health care provider. Make sure you  discuss any questions you have with your health care provider.  

## 2014-10-20 NOTE — Assessment & Plan Note (Signed)
He has no s/s of radiculopathy Will cont percocet as needed for pain No other modalities offered at his request

## 2014-10-20 NOTE — Assessment & Plan Note (Signed)
His BP is well controlled Will cont the ARB

## 2014-10-20 NOTE — Progress Notes (Signed)
Subjective:  Patient ID: Brandon Lane, male    DOB: Dec 08, 1965  Age: 49 y.o. MRN: [id number]  CC: Back Pain and Hypertension   HPI Brandon Lane presents for low back pain, intermittent episodes of both sharp and dull pain, well controlled with percocet. The pain does not radiate and there is no N/W/T in his legs. He is not willing to try any other treatment options.  Outpatient Prescriptions Prior to Visit  Medication Sig Dispense Refill  . ibuprofen (ADVIL,MOTRIN) 600 MG tablet Take 1 tablet (600 mg total) by mouth every 8 (eight) hours as needed for pain. 90 tablet 5  . venlafaxine XR (EFFEXOR XR) 150 MG 24 hr capsule Take 1 capsule (150 mg total) by mouth daily with breakfast. 90 capsule 3  . olmesartan (BENICAR) 40 MG tablet Take 1 tablet (40 mg total) by mouth daily. 90 tablet 1  . oxyCODONE-acetaminophen (PERCOCET) 10-325 MG per tablet Take 1 tablet by mouth every 6 (six) hours as needed for pain. 75 tablet 0   No facility-administered medications prior to visit.    ROS Review of Systems  Constitutional: Negative.  Negative for fever, chills, diaphoresis, appetite change and fatigue.  HENT: Negative.   Eyes: Negative.   Respiratory: Negative.  Negative for cough, choking, chest tightness, shortness of breath and stridor.   Cardiovascular: Negative.  Negative for chest pain, palpitations and leg swelling.  Gastrointestinal: Negative.  Negative for nausea, vomiting, abdominal pain, diarrhea, constipation and blood in stool.  Endocrine: Negative.   Genitourinary: Negative.   Musculoskeletal: Positive for back pain. Negative for myalgias, joint swelling, arthralgias, gait problem and neck pain.  Skin: Negative.   Allergic/Immunologic: Negative.   Neurological: Negative.  Negative for dizziness, tremors, weakness and light-headedness.  Hematological: Negative.  Negative for adenopathy. Does not bruise/bleed easily.  Psychiatric/Behavioral: Negative.     Objective:  BP  126/82 mmHg  Pulse 63  Temp(Src) 97.8 F (36.6 C) (Oral)  Resp 16  Ht  (1.753 m)  Wt 252 lb (114.306 kg)  BMI 37.20 kg/m2  SpO2 98%  BP Readings from Last 3 Encounters:  10/20/14 126/82  07/01/14 134/82  04/17/14 138/82    Wt Readings from Last 3 Encounters:  10/20/14 252 lb (114.306 kg)  07/01/14 247 lb 8 oz (112.265 kg)  04/17/14 245 lb 12 oz (111.471 kg)    Physical Exam  Constitutional: He is oriented to person, place, and time. He appears well-developed and well-nourished.  Non-toxic appearance. He does not have a sickly appearance. He does not appear ill. No distress.  HENT:  Head: Normocephalic and atraumatic.  Mouth/Throat: Oropharynx is clear and moist. No oropharyngeal exudate.  Eyes: Conjunctivae are normal. Right eye exhibits no discharge. Left eye exhibits no discharge. No scleral icterus.  Neck: Normal range of motion. Neck supple. No JVD present. No tracheal deviation present. No thyromegaly present.  Cardiovascular: Normal rate, regular rhythm, normal heart sounds and intact distal pulses.  Exam reveals no gallop and no friction rub.   No murmur heard. Pulmonary/Chest: Effort normal and breath sounds normal. No stridor. No respiratory distress. He has no wheezes. He has no rales. He exhibits no tenderness.  Abdominal: Soft. Bowel sounds are normal. He exhibits no distension and no mass. There is no tenderness. There is no rebound and no guarding.  Musculoskeletal: Normal range of motion. He exhibits no edema or tenderness.  Lymphadenopathy:    He has no cervical adenopathy.  Neurological: He is alert and oriented to person, place,  and time. He has normal strength. He displays no atrophy, no tremor and normal reflexes. No cranial nerve deficit or sensory deficit. He exhibits normal muscle tone. He displays a negative Romberg sign. He displays no seizure activity. Coordination and gait normal.  Reflex Scores:      Tricep reflexes are 1+ on the right side and  1+ on the left side.      Bicep reflexes are 1+ on the right side and 1+ on the left side.      Brachioradialis reflexes are 1+ on the right side and 1+ on the left side.      Patellar reflexes are 1+ on the right side and 1+ on the left side.      Achilles reflexes are 1+ on the right side and 1+ on the left side. Neg SLR in BLE  Skin: Skin is warm and dry. No rash noted. He is not diaphoretic. No erythema. No pallor.  Psychiatric: He has a normal mood and affect. His behavior is normal. Judgment and thought content normal.  Vitals reviewed.   Lab Results  Component Value Date   WBC 7.2 07/11/2013   HGB 15.1 07/11/2013   HCT 44.5 07/11/2013   PLT 127.0* 07/11/2013   GLUCOSE 61* 07/11/2013   CHOL 183 07/11/2013   TRIG 214.0* 07/11/2013   HDL 38.40* 07/11/2013   LDLDIRECT 119.1 07/11/2013   LDLCALC 100* 06/03/2012   ALT 37 12/27/2012   AST 28 12/27/2012   NA 140 07/11/2013   K 4.2 07/11/2013   CL 105 07/11/2013   CREATININE 1.0 07/11/2013   BUN 9 07/11/2013   CO2 31 07/11/2013   TSH 2.37 09/21/2010    Mr Lumbar Spine Wo Contrast  02/01/2014   CLINICAL DATA:  Low back pain which waxes and wanes.  EXAM: MRI LUMBAR SPINE WITHOUT CONTRAST  TECHNIQUE: Multiplanar, multisequence MR imaging of the lumbar spine was performed. No intravenous contrast was administered.  COMPARISON:  04/10/2013 plain films.  FINDINGS: The scan extends from T11-12 through mid sacrum.  Moderate loss of disc height and signal at L4-5. Endplate reactive changes at the L4-5 level indicate chronic disc space narrowing, similar to that seen at T12-L1 where there is an anterior protrusion. No worrisome osseous lesions. Normal conus. Paravertebral soft tissues unremarkable.  The individual disc spaces were examined with axial images as follows:  L1-L2:  Normal.  L2-L3:  Normal.  L3-L4:  Normal disc space.  Mild facet arthropathy.  No impingement.  L4-L5: Central disc osteophyte complex narrows the spinal canal and  RIGHT greater than LEFT subarticular zones. Mild facet and ligamentum flavum hypertrophy is superimposed. No definite L5 nerve root impingement in the canal. There is BILATERAL foraminal narrowing which could affect either L4 nerve root.  L5-S1:  Mild bulge.  Mild facet arthropathy.  No impingement.  IMPRESSION: Multilevel spondylosis as described. Disc space narrowing at L4-5 with BILATERAL foraminal narrowing. Correlate clinically for RIGHT or LEFT L4 nerve root symptomatology.   Electronically Signed   By: Davonna BellingJohn  Curnes M.D.   On: 02/01/2014 09:17    Assessment & Plan:   Brandon Lane was seen today for back pain and hypertension.  Diagnoses and all orders for this visit:  Essential hypertension, benign Orders: -     olmesartan (BENICAR) 40 MG tablet; Take 1 tablet (40 mg total) by mouth daily.  DDD (degenerative disc disease), lumbar Orders: -     Discontinue: oxyCODONE-acetaminophen (PERCOCET) 10-325 MG per tablet; Take 1 tablet by mouth every  6 (six) hours as needed for pain. -     Discontinue: oxyCODONE-acetaminophen (PERCOCET) 10-325 MG per tablet; Take 1 tablet by mouth every 6 (six) hours as needed for pain. -     Discontinue: oxyCODONE-acetaminophen (PERCOCET) 10-325 MG per tablet; Take 1 tablet by mouth every 6 (six) hours as needed for pain. -     oxyCODONE-acetaminophen (PERCOCET) 10-325 MG per tablet; Take 1 tablet by mouth every 6 (six) hours as needed for pain.  Bilateral low back pain without sciatica Orders: -     Discontinue: oxyCODONE-acetaminophen (PERCOCET) 10-325 MG per tablet; Take 1 tablet by mouth every 6 (six) hours as needed for pain. -     Discontinue: oxyCODONE-acetaminophen (PERCOCET) 10-325 MG per tablet; Take 1 tablet by mouth every 6 (six) hours as needed for pain. -     Discontinue: oxyCODONE-acetaminophen (PERCOCET) 10-325 MG per tablet; Take 1 tablet by mouth every 6 (six) hours as needed for pain. -     oxyCODONE-acetaminophen (PERCOCET) 10-325 MG per tablet;  Take 1 tablet by mouth every 6 (six) hours as needed for pain.  Midline low back pain without sciatica Orders: -     Discontinue: oxyCODONE-acetaminophen (PERCOCET) 10-325 MG per tablet; Take 1 tablet by mouth every 6 (six) hours as needed for pain. -     Discontinue: oxyCODONE-acetaminophen (PERCOCET) 10-325 MG per tablet; Take 1 tablet by mouth every 6 (six) hours as needed for pain. -     Discontinue: oxyCODONE-acetaminophen (PERCOCET) 10-325 MG per tablet; Take 1 tablet by mouth every 6 (six) hours as needed for pain. -     oxyCODONE-acetaminophen (PERCOCET) 10-325 MG per tablet; Take 1 tablet by mouth every 6 (six) hours as needed for pain.   I am having Brandon Lane maintain his ibuprofen, venlafaxine XR, olmesartan, and oxyCODONE-acetaminophen.  Meds ordered this encounter  Medications  . olmesartan (BENICAR) 40 MG tablet    Sig: Take 1 tablet (40 mg total) by mouth daily.    Dispense:  90 tablet    Refill:  3  . DISCONTD: oxyCODONE-acetaminophen (PERCOCET) 10-325 MG per tablet    Sig: Take 1 tablet by mouth every 6 (six) hours as needed for pain.    Dispense:  75 tablet    Refill:  0    Fill on or after 10/20/14  . DISCONTD: oxyCODONE-acetaminophen (PERCOCET) 10-325 MG per tablet    Sig: Take 1 tablet by mouth every 6 (six) hours as needed for pain.    Dispense:  75 tablet    Refill:  0    Fill on or after 11/20/14  . DISCONTD: oxyCODONE-acetaminophen (PERCOCET) 10-325 MG per tablet    Sig: Take 1 tablet by mouth every 6 (six) hours as needed for pain.    Dispense:  75 tablet    Refill:  0    Fill on or after 12/20/14  . oxyCODONE-acetaminophen (PERCOCET) 10-325 MG per tablet    Sig: Take 1 tablet by mouth every 6 (six) hours as needed for pain.    Dispense:  75 tablet    Refill:  0    Fill on or after 01/20/15   Comments: Followed by Dr. Danielle DessElsner - he tells me that surgery is not an option, he will not do PT, he is not willing to see pain mangt to consider an ESI.  He  has no s/s of radiculopathy Will cont percocet as needed for pain No other modalities offered at his request  His BP is well controlled  Will cont the ARB   Follow-up: Return in about 4 months (around 02/20/2015).  Sanda Linger, MD

## 2014-10-29 ENCOUNTER — Ambulatory Visit: Payer: BLUE CROSS/BLUE SHIELD | Admitting: Internal Medicine

## 2014-10-30 ENCOUNTER — Ambulatory Visit: Payer: BLUE CROSS/BLUE SHIELD | Admitting: Internal Medicine

## 2015-02-22 ENCOUNTER — Ambulatory Visit (INDEPENDENT_AMBULATORY_CARE_PROVIDER_SITE_OTHER): Payer: BLUE CROSS/BLUE SHIELD | Admitting: Internal Medicine

## 2015-02-22 ENCOUNTER — Other Ambulatory Visit (INDEPENDENT_AMBULATORY_CARE_PROVIDER_SITE_OTHER): Payer: BLUE CROSS/BLUE SHIELD

## 2015-02-22 ENCOUNTER — Encounter: Payer: Self-pay | Admitting: Internal Medicine

## 2015-02-22 VITALS — BP 118/80 | HR 79 | Temp 97.5°F | Resp 16 | Ht 69.0 in | Wt 244.0 lb

## 2015-02-22 DIAGNOSIS — M5136 Other intervertebral disc degeneration, lumbar region: Secondary | ICD-10-CM | POA: Diagnosis not present

## 2015-02-22 DIAGNOSIS — Z0000 Encounter for general adult medical examination without abnormal findings: Secondary | ICD-10-CM | POA: Insufficient documentation

## 2015-02-22 DIAGNOSIS — R7989 Other specified abnormal findings of blood chemistry: Secondary | ICD-10-CM | POA: Diagnosis not present

## 2015-02-22 DIAGNOSIS — F329 Major depressive disorder, single episode, unspecified: Secondary | ICD-10-CM

## 2015-02-22 DIAGNOSIS — L239 Allergic contact dermatitis, unspecified cause: Secondary | ICD-10-CM

## 2015-02-22 DIAGNOSIS — D696 Thrombocytopenia, unspecified: Secondary | ICD-10-CM | POA: Diagnosis not present

## 2015-02-22 DIAGNOSIS — J301 Allergic rhinitis due to pollen: Secondary | ICD-10-CM | POA: Insufficient documentation

## 2015-02-22 DIAGNOSIS — M545 Low back pain, unspecified: Secondary | ICD-10-CM

## 2015-02-22 DIAGNOSIS — F32A Depression, unspecified: Secondary | ICD-10-CM

## 2015-02-22 DIAGNOSIS — L200 Besnier's prurigo: Secondary | ICD-10-CM

## 2015-02-22 DIAGNOSIS — I10 Essential (primary) hypertension: Secondary | ICD-10-CM

## 2015-02-22 DIAGNOSIS — K219 Gastro-esophageal reflux disease without esophagitis: Secondary | ICD-10-CM

## 2015-02-22 LAB — COMPREHENSIVE METABOLIC PANEL
ALT: 21 U/L (ref 0–53)
AST: 21 U/L (ref 0–37)
Albumin: 4.1 g/dL (ref 3.5–5.2)
Alkaline Phosphatase: 88 U/L (ref 39–117)
BUN: 15 mg/dL (ref 6–23)
CALCIUM: 9.6 mg/dL (ref 8.4–10.5)
CHLORIDE: 101 meq/L (ref 96–112)
CO2: 30 meq/L (ref 19–32)
CREATININE: 1.05 mg/dL (ref 0.40–1.50)
GFR: 80.07 mL/min (ref >60.00)
Glucose, Bld: 80 mg/dL (ref 70–99)
POTASSIUM: 3.8 meq/L (ref 3.5–5.1)
SODIUM: 139 meq/L (ref 135–145)
Total Bilirubin: 0.9 mg/dL (ref 0.2–1.2)
Total Protein: 7.1 g/dL (ref 6.0–8.3)

## 2015-02-22 LAB — CBC WITH DIFFERENTIAL/PLATELET
BASOS PCT: 0.7 % (ref 0.0–3.0)
Basophils Absolute: 0.1 10*3/uL (ref 0.0–0.1)
EOS ABS: 0.5 10*3/uL (ref 0.0–0.7)
EOS PCT: 5.5 % — AB (ref 0.0–5.0)
HEMATOCRIT: 43.9 % (ref 39.0–52.0)
HEMOGLOBIN: 15 g/dL (ref 13.0–17.0)
LYMPHS PCT: 32.2 % (ref 12.0–46.0)
Lymphs Abs: 2.6 10*3/uL (ref 0.7–4.0)
MCHC: 34.1 g/dL (ref 30.0–36.0)
MCV: 94.2 fl (ref 78.0–100.0)
Monocytes Absolute: 1 10*3/uL (ref 0.1–1.0)
Monocytes Relative: 12 % (ref 3.0–12.0)
NEUTROS ABS: 4.1 10*3/uL (ref 1.4–7.7)
Neutrophils Relative %: 49.6 % (ref 43.0–77.0)
PLATELETS: 127 10*3/uL — AB (ref 150.0–400.0)
RBC: 4.67 Mil/uL (ref 4.22–5.81)
RDW: 12.9 % (ref 11.5–15.5)
WBC: 8.2 10*3/uL (ref 4.0–10.5)

## 2015-02-22 LAB — LIPID PANEL
CHOL/HDL RATIO: 4
CHOLESTEROL: 161 mg/dL (ref 0–200)
HDL: 36.8 mg/dL — AB (ref >39.00)
NonHDL: 123.92
TRIGLYCERIDES: 376 mg/dL — AB (ref 0.0–149.0)
VLDL: 75.2 mg/dL — AB (ref 0.0–40.0)

## 2015-02-22 LAB — TSH: TSH: 2.5 u[IU]/mL (ref 0.35–4.50)

## 2015-02-22 LAB — LDL CHOLESTEROL, DIRECT: LDL DIRECT: 82 mg/dL

## 2015-02-22 MED ORDER — VENLAFAXINE HCL ER 150 MG PO CP24: 150.0000 mg | ORAL_CAPSULE | Freq: Every day | ORAL | Status: DC

## 2015-02-22 MED ORDER — OXYCODONE-ACETAMINOPHEN 10-325 MG PO TABS: 1.0000 | ORAL_TABLET | Freq: Four times a day (QID) | ORAL | Status: DC | PRN

## 2015-02-22 MED ORDER — OLMESARTAN MEDOXOMIL 40 MG PO TABS: 40.0000 mg | ORAL_TABLET | Freq: Every day | ORAL | Status: DC

## 2015-02-22 MED ORDER — CETIRIZINE HCL 10 MG PO TABS: 10.0000 mg | ORAL_TABLET | Freq: Every day | ORAL | Status: DC

## 2015-02-22 MED ORDER — FLUOCINONIDE-E 0.05 % EX CREA: 1.0000 "application " | TOPICAL_CREAM | Freq: Two times a day (BID) | CUTANEOUS | Status: DC

## 2015-02-22 MED ORDER — MOMETASONE FUROATE 50 MCG/ACT NA SUSP: 4.0000 | Freq: Every day | NASAL | Status: DC

## 2015-02-22 NOTE — Patient Instructions (Signed)

## 2015-02-22 NOTE — Progress Notes (Signed)
Subjective:  Patient ID: Brandon Lane, male    DOB: [birth year]  Age: 49 y.o. MRN: [id number]  CC: Gastrophageal Reflux; Back Pain; Annual Exam; and Rash   HPI Brandon Lane presents for routine physical and follow-up on back pain. He has chronic, intermittent back pain that does not change since his last visit. He also complains today of a couple of episodes of heartburn and says he takes over-the-counter doses of Nexium and it provides symptom relief. In addition, he complains of a rash on his lower legs. He has dry, scaly spots that itch and sometimes he scratches the lesion so badly that they turn into scabs and sores. He has had the rash for several months. He also complains of nasal allergies with persistent nasal congestion, sneezing, runny nose and postnasal drip. He is not treating the nasal allergies with any medications.  Outpatient Prescriptions Prior to Visit  Medication Sig Dispense Refill  . ibuprofen (ADVIL,MOTRIN) 600 MG tablet Take 1 tablet (600 mg total) by mouth every 8 (eight) hours as needed for pain. 90 tablet 5  . olmesartan (BENICAR) 40 MG tablet Take 1 tablet (40 mg total) by mouth daily. 90 tablet 3  . oxyCODONE-acetaminophen (PERCOCET) 10-325 MG per tablet Take 1 tablet by mouth every 6 (six) hours as needed for pain. 75 tablet 0  . venlafaxine XR (EFFEXOR XR) 150 MG 24 hr capsule Take 1 capsule (150 mg total) by mouth daily with breakfast. 90 capsule 3   No facility-administered medications prior to visit.    ROS Review of Systems  Constitutional: Positive for unexpected weight change (wt gain). Negative for fever, chills, diaphoresis, activity change, appetite change and fatigue.  HENT: Positive for congestion, postnasal drip, rhinorrhea and sore throat. Negative for dental problem, facial swelling, sinus pressure, sneezing, tinnitus, trouble swallowing and voice change.   Eyes: Negative.   Respiratory: Positive for apnea. Negative for cough, choking, chest  tightness, shortness of breath, wheezing and stridor.   Cardiovascular: Negative.  Negative for chest pain, palpitations and leg swelling.  Gastrointestinal: Negative.  Negative for nausea, vomiting, abdominal pain, diarrhea, constipation and blood in stool.  Endocrine: Negative.   Genitourinary: Negative.   Musculoskeletal: Positive for back pain and arthralgias. Negative for myalgias, joint swelling, gait problem, neck pain and neck stiffness.  Skin: Positive for rash. Negative for color change, pallor and wound.  Allergic/Immunologic: Negative.   Neurological: Negative.  Negative for dizziness, syncope, speech difficulty, weakness, light-headedness, numbness and headaches.  Hematological: Negative.  Negative for adenopathy. Does not bruise/bleed easily.  Psychiatric/Behavioral: Positive for dysphoric mood. Negative for suicidal ideas, hallucinations, behavioral problems, confusion, sleep disturbance, self-injury, decreased concentration and agitation. The patient is not nervous/anxious and is not hyperactive.     Objective:  BP 118/80 mmHg  Pulse 79  Temp(Src) 97.5 F (36.4 C) (Oral)  Resp 16  Ht  (1.753 m)  Wt 244 lb (110.678 kg)  BMI 36.02 kg/m2  SpO2 96%  BP Readings from Last 3 Encounters:  02/22/15 118/80  10/20/14 126/82  07/01/14 134/82    Wt Readings from Last 3 Encounters:  02/22/15 244 lb (110.678 kg)  10/20/14 252 lb (114.306 kg)  07/01/14 247 lb 8 oz (112.265 kg)    Physical Exam  Constitutional: He is oriented to person, place, and time. No distress.  HENT:  Nose: Mucosal edema present. No rhinorrhea, sinus tenderness, septal deviation or nasal septal hematoma. Right sinus exhibits no maxillary sinus tenderness and no frontal sinus tenderness. Left sinus  exhibits no maxillary sinus tenderness and no frontal sinus tenderness.  Mouth/Throat: Oropharynx is clear and moist. Mucous membranes are not pale, not dry and not cyanotic. No oral lesions. No trismus in  the jaw. No uvula swelling. No oropharyngeal exudate, posterior oropharyngeal edema, posterior oropharyngeal erythema or tonsillar abscesses.  Eyes: Conjunctivae are normal. Right eye exhibits no discharge. Left eye exhibits no discharge. No scleral icterus.  Neck: Normal range of motion. Neck supple. No JVD present. No tracheal deviation present. No thyromegaly present.  Cardiovascular: Normal rate, regular rhythm, normal heart sounds and intact distal pulses.  Exam reveals no gallop and no friction rub.   No murmur heard. Pulmonary/Chest: Effort normal and breath sounds normal. No stridor. No respiratory distress. He has no wheezes. He has no rales. He exhibits no tenderness.  Abdominal: Soft. Bowel sounds are normal. He exhibits no distension and no mass. There is no tenderness. There is no rebound and no guarding. Hernia confirmed negative in the right inguinal area and confirmed negative in the left inguinal area.  Genitourinary: Rectum normal, prostate normal, testes normal and penis normal. Rectal exam shows no external hemorrhoid, no internal hemorrhoid, no fissure, no mass, no tenderness and anal tone normal. Guaiac negative stool. Prostate is not enlarged and not tender. Right testis shows no mass, no swelling and no tenderness. Right testis is descended. Left testis shows no mass, no swelling and no tenderness. Left testis is descended. Circumcised. No penile tenderness. No discharge found.  Musculoskeletal: Normal range of motion. He exhibits no edema or tenderness.  Lymphadenopathy:    He has no cervical adenopathy.       Right: No inguinal adenopathy present.       Left: No inguinal adenopathy present.  Neurological: He is alert and oriented to person, place, and time. He has normal reflexes. He displays normal reflexes. No cranial nerve deficit. He exhibits normal muscle tone. Coordination normal.  Neg SLR in BLE  Skin: Skin is warm and dry. Rash noted. He is not diaphoretic. No  erythema.  Over the bilateral anterior lower extremities there are patches of scale, xerosis and hyperpigmentation. A few of these lesions are excoriated. There is minimal scab formation. These lesions measure about 1-1.5 cm. There is no evidence of wound infection.  Psychiatric: He has a normal mood and affect. His behavior is normal. Judgment and thought content normal.  Vitals reviewed.   Lab Results  Component Value Date   WBC 7.2 07/11/2013   HGB 15.1 07/11/2013   HCT 44.5 07/11/2013   PLT 127.0* 07/11/2013   GLUCOSE 61* 07/11/2013   CHOL 183 07/11/2013   TRIG 214.0* 07/11/2013   HDL 38.40* 07/11/2013   LDLDIRECT 119.1 07/11/2013   LDLCALC 100* 06/03/2012   ALT 37 12/27/2012   AST 28 12/27/2012   NA 140 07/11/2013   K 4.2 07/11/2013   CL 105 07/11/2013   CREATININE 1.0 07/11/2013   BUN 9 07/11/2013   CO2 31 07/11/2013   TSH 2.37 09/21/2010    Mr Lumbar Spine Wo Contrast  02/01/2014   CLINICAL DATA:  Low back pain which waxes and wanes.  EXAM: MRI LUMBAR SPINE WITHOUT CONTRAST  TECHNIQUE: Multiplanar, multisequence MR imaging of the lumbar spine was performed. No intravenous contrast was administered.  COMPARISON:  04/10/2013 plain films.  FINDINGS: The scan extends from T11-12 through mid sacrum.  Moderate loss of disc height and signal at L4-5. Endplate reactive changes at the L4-5 level indicate chronic disc space narrowing, similar to  that seen at T12-L1 where there is an anterior protrusion. No worrisome osseous lesions. Normal conus. Paravertebral soft tissues unremarkable.  The individual disc spaces were examined with axial images as follows:  L1-L2:  Normal.  L2-L3:  Normal.  L3-L4:  Normal disc space.  Mild facet arthropathy.  No impingement.  L4-L5: Central disc osteophyte complex narrows the spinal canal and RIGHT greater than LEFT subarticular zones. Mild facet and ligamentum flavum hypertrophy is superimposed. No definite L5 nerve root impingement in the canal. There  is BILATERAL foraminal narrowing which could affect either L4 nerve root.  L5-S1:  Mild bulge.  Mild facet arthropathy.  No impingement.  IMPRESSION: Multilevel spondylosis as described. Disc space narrowing at L4-5 with BILATERAL foraminal narrowing. Correlate clinically for RIGHT or LEFT L4 nerve root symptomatology.   Electronically Signed   By: Davonna Belling M.D.   On: 02/01/2014 09:17    Assessment & Plan:   Cebert was seen today for gastrophageal reflux, back pain, annual exam and rash.  Diagnoses and all orders for this visit:  Essential hypertension, benign- his BP is well controlled, lytes and renal function are stable -     olmesartan (BENICAR) 40 MG tablet; Take 1 tablet (40 mg total) by mouth daily.  Depression- improvement noted on effexor -     Discontinue: venlafaxine XR (EFFEXOR XR) 150 MG 24 hr capsule; Take 1 capsule (150 mg total) by mouth daily with breakfast. -     venlafaxine XR (EFFEXOR XR) 150 MG 24 hr capsule; Take 1 capsule (150 mg total) by mouth daily with breakfast.  DDD (degenerative disc disease), lumbar -     Discontinue: oxyCODONE-acetaminophen (PERCOCET) 10-325 MG per tablet; Take 1 tablet by mouth every 6 (six) hours as needed for pain. -     Discontinue: oxyCODONE-acetaminophen (PERCOCET) 10-325 MG per tablet; Take 1 tablet by mouth every 6 (six) hours as needed for pain. -     oxyCODONE-acetaminophen (PERCOCET) 10-325 MG per tablet; Take 1 tablet by mouth every 6 (six) hours as needed for pain.  Bilateral low back pain without sciatica- no changes noted in his symptoms or physical exam, he is getting adequate pain relief with percocet, will continue -     Discontinue: oxyCODONE-acetaminophen (PERCOCET) 10-325 MG per tablet; Take 1 tablet by mouth every 6 (six) hours as needed for pain. -     Discontinue: oxyCODONE-acetaminophen (PERCOCET) 10-325 MG per tablet; Take 1 tablet by mouth every 6 (six) hours as needed for pain. -     oxyCODONE-acetaminophen  (PERCOCET) 10-325 MG per tablet; Take 1 tablet by mouth every 6 (six) hours as needed for pain.  Midline low back pain without sciatica -     Discontinue: oxyCODONE-acetaminophen (PERCOCET) 10-325 MG per tablet; Take 1 tablet by mouth every 6 (six) hours as needed for pain. -     Discontinue: oxyCODONE-acetaminophen (PERCOCET) 10-325 MG per tablet; Take 1 tablet by mouth every 6 (six) hours as needed for pain. -     oxyCODONE-acetaminophen (PERCOCET) 10-325 MG per tablet; Take 1 tablet by mouth every 6 (six) hours as needed for pain.  Thrombocytopenia  Routine general medical examination at a health care facility- exam done, vaccines were reviewed and updated, labs ordered and reviewed, pt ed material was given. -     Lipid panel; Future -     Comprehensive metabolic panel; Future -     CBC with Differential/Platelet; Future -     TSH; Future  Gastroesophageal reflux disease  without esophagitis- this is well controlled with an OTC PPI  Eczema, allergic -     cetirizine (ZYRTEC) 10 MG tablet; Take 1 tablet (10 mg total) by mouth daily. -     fluocinonide-emollient (LIDEX-E) 0.05 % cream; Apply 1 application topically 2 (two) times daily.  Allergic rhinitis due to pollen -     cetirizine (ZYRTEC) 10 MG tablet; Take 1 tablet (10 mg total) by mouth daily. -     mometasone (NASONEX) 50 MCG/ACT nasal spray; Place 4 sprays into the nose daily.   I am having Brandon Lane start on cetirizine, mometasone, and fluocinonide-emollient. I am also having him maintain his ibuprofen, olmesartan, venlafaxine XR, and oxyCODONE-acetaminophen.  Meds ordered this encounter  Medications  . olmesartan (BENICAR) 40 MG tablet    Sig: Take 1 tablet (40 mg total) by mouth daily.    Dispense:  90 tablet    Refill:  3  . DISCONTD: venlafaxine XR (EFFEXOR XR) 150 MG 24 hr capsule    Sig: Take 1 capsule (150 mg total) by mouth daily with breakfast.    Dispense:  90 capsule    Refill:  3  . DISCONTD:  oxyCODONE-acetaminophen (PERCOCET) 10-325 MG per tablet    Sig: Take 1 tablet by mouth every 6 (six) hours as needed for pain.    Dispense:  75 tablet    Refill:  0    Fill on or after 02/22/15  . cetirizine (ZYRTEC) 10 MG tablet    Sig: Take 1 tablet (10 mg total) by mouth daily.    Dispense:  30 tablet    Refill:  11  . mometasone (NASONEX) 50 MCG/ACT nasal spray    Sig: Place 4 sprays into the nose daily.    Dispense:  17 g    Refill:  12  . fluocinonide-emollient (LIDEX-E) 0.05 % cream    Sig: Apply 1 application topically 2 (two) times daily.    Dispense:  60 g    Refill:  2  . venlafaxine XR (EFFEXOR XR) 150 MG 24 hr capsule    Sig: Take 1 capsule (150 mg total) by mouth daily with breakfast.    Dispense:  90 capsule    Refill:  3  . DISCONTD: oxyCODONE-acetaminophen (PERCOCET) 10-325 MG per tablet    Sig: Take 1 tablet by mouth every 6 (six) hours as needed for pain.    Dispense:  75 tablet    Refill:  0    Fill on or after 03/24/15  . oxyCODONE-acetaminophen (PERCOCET) 10-325 MG per tablet    Sig: Take 1 tablet by mouth every 6 (six) hours as needed for pain.    Dispense:  75 tablet    Refill:  0    Fill on or after 04/24/15     Follow-up: Return in about 3 months (around 05/24/2015).  Sanda Linger, MD

## 2015-02-22 NOTE — Progress Notes (Signed)
Pre visit review using our clinic review tool, if applicable. No additional management support is needed unless otherwise documented below in the visit note. 

## 2015-05-24 ENCOUNTER — Encounter: Payer: Self-pay | Admitting: Internal Medicine

## 2015-05-24 ENCOUNTER — Ambulatory Visit (INDEPENDENT_AMBULATORY_CARE_PROVIDER_SITE_OTHER): Payer: BLUE CROSS/BLUE SHIELD | Admitting: Internal Medicine

## 2015-05-24 VITALS — BP 138/80 | HR 72 | Temp 98.0°F | Resp 16 | Ht 69.0 in | Wt 243.0 lb

## 2015-05-24 DIAGNOSIS — I10 Essential (primary) hypertension: Secondary | ICD-10-CM | POA: Diagnosis not present

## 2015-05-24 DIAGNOSIS — M5136 Other intervertebral disc degeneration, lumbar region: Secondary | ICD-10-CM

## 2015-05-24 DIAGNOSIS — M545 Low back pain, unspecified: Secondary | ICD-10-CM

## 2015-05-24 DIAGNOSIS — F329 Major depressive disorder, single episode, unspecified: Secondary | ICD-10-CM

## 2015-05-24 DIAGNOSIS — F32A Depression, unspecified: Secondary | ICD-10-CM

## 2015-05-24 MED ORDER — OXYCODONE-ACETAMINOPHEN 10-325 MG PO TABS: 1.0000 | ORAL_TABLET | Freq: Four times a day (QID) | ORAL | Status: DC | PRN

## 2015-05-24 MED ORDER — BUPROPION HCL ER (SR) 100 MG PO TB12: 100.0000 mg | ORAL_TABLET | Freq: Two times a day (BID) | ORAL | Status: DC

## 2015-05-24 MED ORDER — VENLAFAXINE HCL ER 150 MG PO CP24: 150.0000 mg | ORAL_CAPSULE | Freq: Every day | ORAL | Status: DC
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
## 2015-05-24 NOTE — Patient Instructions (Signed)
Major Depressive Disorder Major depressive disorder is a mental illness. It also may be called clinical depression or unipolar depression. Major depressive disorder usually causes feelings of sadness, hopelessness, or helplessness. Some people with this disorder do not feel particularly sad but lose interest in doing things they used to enjoy (anhedonia). Major depressive disorder also can cause physical symptoms. It can interfere with work, school, relationships, and other normal everyday activities. The disorder varies in severity but is longer lasting and more serious than the sadness we all feel from time to time in our lives. Major depressive disorder often is triggered by stressful life events or major life changes. Examples of these triggers include divorce, loss of your job or home, a move, and the death of a family member or close friend. Sometimes this disorder occurs for no obvious reason at all. People who have family members with major depressive disorder or bipolar disorder are at higher risk for developing this disorder, with or without life stressors. Major depressive disorder can occur at any age. It may occur just once in your life (single episode major depressive disorder). It may occur multiple times (recurrent major depressive disorder). SYMPTOMS People with major depressive disorder have either anhedonia or depressed mood on nearly a daily basis for at least 2 weeks or longer. Symptoms of depressed mood include:  Feelings of sadness (blue or down in the dumps) or emptiness.  Feelings of hopelessness or helplessness.  Tearfulness or episodes of crying (may be observed by others).  Irritability (children and adolescents). In addition to depressed mood or anhedonia or both, people with this disorder have at least four of the following symptoms:  Difficulty sleeping or sleeping too much.   Significant change (increase or decrease) in appetite or weight.   Lack of energy or  motivation.  Feelings of guilt and worthlessness.   Difficulty concentrating, remembering, or making decisions.  Unusually slow movement (psychomotor retardation) or restlessness (as observed by others).   Recurrent wishes for death, recurrent thoughts of self-harm (suicide), or a suicide attempt. People with major depressive disorder commonly have persistent negative thoughts about themselves, other people, and the world. People with severe major depressive disorder may experiencedistorted beliefs or perceptions about the world (psychotic delusions). They also may see or hear things that are not real (psychotic hallucinations). DIAGNOSIS Major depressive disorder is diagnosed through an assessment by your health care provider. Your health care provider will ask aboutaspects of your daily life, such as mood,sleep, and appetite, to see if you have the diagnostic symptoms of major depressive disorder. Your health care provider may ask about your medical history and use of alcohol or drugs, including prescription medicines. Your health care provider also may do a physical exam and blood work. This is because certain medical conditions and the use of certain substances can cause major depressive disorder-like symptoms (secondary depression). Your health care provider also may refer you to a mental health specialist for further evaluation and treatment. TREATMENT It is important to recognize the symptoms of major depressive disorder and seek treatment. The following treatments can be prescribed for this disorder:   Medicine. Antidepressant medicines usually are prescribed. Antidepressant medicines are thought to correct chemical imbalances in the brain that are commonly associated with major depressive disorder. Other types of medicine may be added if the symptoms do not respond to antidepressant medicines alone or if psychotic delusions or hallucinations occur.  Talk therapy. Talk therapy can be  helpful in treating major depressive disorder by providing   support, education, and guidance. Certain types of talk therapy also can help with negative thinking (cognitive behavioral therapy) and with relationship issues that trigger this disorder (interpersonal therapy). A mental health specialist can help determine which treatment is best for you. Most people with major depressive disorder do well with a combination of medicine and talk therapy. Treatments involving electrical stimulation of the brain can be used in situations with extremely severe symptoms or when medicine and talk therapy do not work over time. These treatments include electroconvulsive therapy, transcranial magnetic stimulation, and vagal nerve stimulation.   This information is not intended to replace advice given to you by your health care provider. Make sure you discuss any questions you have with your health care provider.   Document Released: 09/16/2012 Document Revised: 06/12/2014 Document Reviewed: 09/16/2012 [employer] Interactive Patient Education 2016 [employer]  

## 2015-05-24 NOTE — Progress Notes (Signed)
Pre visit review using our clinic review tool, if applicable. No additional management support is needed unless otherwise documented below in the visit note. 

## 2015-08-23 ENCOUNTER — Encounter: Payer: Self-pay | Admitting: Internal Medicine

## 2015-08-23 ENCOUNTER — Ambulatory Visit (INDEPENDENT_AMBULATORY_CARE_PROVIDER_SITE_OTHER): Payer: BLUE CROSS/BLUE SHIELD | Admitting: Internal Medicine

## 2015-08-23 VITALS — BP 118/80 | HR 71 | Temp 97.2°F | Resp 16 | Ht 69.0 in | Wt 236.0 lb

## 2015-08-23 DIAGNOSIS — I10 Essential (primary) hypertension: Secondary | ICD-10-CM | POA: Diagnosis not present

## 2015-08-23 DIAGNOSIS — M545 Low back pain, unspecified: Secondary | ICD-10-CM

## 2015-08-23 DIAGNOSIS — M4806 Spinal stenosis, lumbar region: Secondary | ICD-10-CM

## 2015-08-23 DIAGNOSIS — F32A Depression, unspecified: Secondary | ICD-10-CM

## 2015-08-23 DIAGNOSIS — M48061 Spinal stenosis, lumbar region without neurogenic claudication: Secondary | ICD-10-CM

## 2015-08-23 DIAGNOSIS — F329 Major depressive disorder, single episode, unspecified: Secondary | ICD-10-CM | POA: Diagnosis not present

## 2015-08-23 DIAGNOSIS — M5136 Other intervertebral disc degeneration, lumbar region: Secondary | ICD-10-CM | POA: Diagnosis not present

## 2015-08-23 MED ORDER — BUPROPION HCL ER (SR) 100 MG PO TB12
100.0000 mg | ORAL_TABLET | Freq: Two times a day (BID) | ORAL | Status: DC
Start: 2015-08-23 — End: 2015-12-23

## 2015-08-23 MED ORDER — MELOXICAM 15 MG PO TABS: 15.0000 mg | ORAL_TABLET | Freq: Every day | ORAL | Status: DC

## 2015-08-23 MED ORDER — OXYCODONE-ACETAMINOPHEN 10-325 MG PO TABS: 1.0000 | ORAL_TABLET | Freq: Four times a day (QID) | ORAL | Status: DC | PRN

## 2015-08-23 MED ORDER — OLMESARTAN MEDOXOMIL 40 MG PO TABS: 40.0000 mg | ORAL_TABLET | Freq: Every day | ORAL | Status: DC

## 2015-08-23 NOTE — Progress Notes (Signed)
Subjective:  Patient ID: Brandon Lane, male    DOB: 08-02-1965  Age: 50 y.o. MRN: [id number]  CC: Hypertension and Back Pain   HPI Brandon Lane presents for a BP check and f/up on chronic LBP. He tells me the pain is worsening and radiates into his LE's intermittently and he complains of tingling in both thighs. He gets pain relief percocet.  He tells me that his BP has been well controlled on the ARB. He denies CP, SOB, DOE, edema, or fatigue.  Outpatient Prescriptions Prior to Visit  Medication Sig Dispense Refill  . cetirizine (ZYRTEC) 10 MG tablet Take 1 tablet (10 mg total) by mouth daily. 30 tablet 11  . fluocinonide-emollient (LIDEX-E) 0.05 % cream Apply 1 application topically 2 (two) times daily. 60 g 2  . mometasone (NASONEX) 50 MCG/ACT nasal spray Place 4 sprays into the nose daily. 17 g 12  . venlafaxine XR (EFFEXOR XR) 150 MG 24 hr capsule Take 1 capsule (150 mg total) by mouth daily with breakfast. 90 capsule 3  . buPROPion (WELLBUTRIN SR) 100 MG 12 hr tablet Take 1 tablet (100 mg total) by mouth 2 (two) times daily. 180 tablet 1  . ibuprofen (ADVIL,MOTRIN) 600 MG tablet Take 1 tablet (600 mg total) by mouth every 8 (eight) hours as needed for pain. 90 tablet 5  . meloxicam (MOBIC) 15 MG tablet     . olmesartan (BENICAR) 40 MG tablet Take 1 tablet (40 mg total) by mouth daily. 90 tablet 3  . oxyCODONE-acetaminophen (PERCOCET) 10-325 MG tablet Take 1 tablet by mouth every 6 (six) hours as needed for pain. 75 tablet 0   No facility-administered medications prior to visit.    ROS Review of Systems  Constitutional: Negative.  Negative for fever, chills, diaphoresis, appetite change and fatigue.  HENT: Negative.   Eyes: Negative.  Negative for visual disturbance.  Respiratory: Negative.  Negative for apnea, cough, choking, shortness of breath and stridor.   Cardiovascular: Negative.  Negative for chest pain, palpitations and leg swelling.  Gastrointestinal: Negative.   Negative for abdominal pain.  Endocrine: Negative.   Genitourinary: Negative.  Negative for dysuria, urgency, hematuria and difficulty urinating.  Musculoskeletal: Positive for back pain. Negative for myalgias, joint swelling, arthralgias, gait problem and neck pain.  Skin: Negative.   Allergic/Immunologic: Negative.   Neurological: Negative for dizziness, speech difficulty, weakness, numbness and headaches.  Hematological: Negative.  Negative for adenopathy. Does not bruise/bleed easily.  Psychiatric/Behavioral: Negative.     Objective:  BP 118/80 mmHg  Pulse 71  Temp(Src) 97.2 F (36.2 C) (Oral)  Resp 16  Ht  (1.753 m)  Wt 236 lb (107.049 kg)  BMI 34.84 kg/m2  SpO2 97%  BP Readings from Last 3 Encounters:  08/23/15 118/80  05/24/15 138/80  02/22/15 118/80    Wt Readings from Last 3 Encounters:  08/23/15 236 lb (107.049 kg)  05/24/15 243 lb (110.224 kg)  02/22/15 244 lb (110.678 kg)    Physical Exam  Constitutional: He is oriented to person, place, and time. He appears well-developed and well-nourished. No distress.  HENT:  Mouth/Throat: Oropharynx is clear and moist. No oropharyngeal exudate.  Eyes: Conjunctivae are normal. Right eye exhibits no discharge. Left eye exhibits no discharge. No scleral icterus.  Neck: Normal range of motion. Neck supple. No JVD present. No tracheal deviation present. No thyromegaly present.  Cardiovascular: Normal rate, regular rhythm, normal heart sounds and intact distal pulses.  Exam reveals no gallop and no friction rub.  No murmur heard. Pulmonary/Chest: Effort normal and breath sounds normal. No stridor. No respiratory distress. He has no wheezes. He has no rales. He exhibits no tenderness.  Abdominal: Soft. Bowel sounds are normal. He exhibits no distension and no mass. There is no tenderness. There is no rebound and no guarding.  Musculoskeletal: Normal range of motion. He exhibits no edema or tenderness.       Lumbar back:  Normal. He exhibits normal range of motion, no tenderness, no bony tenderness, no swelling, no edema, no deformity and no pain.  Lymphadenopathy:    He has no cervical adenopathy.  Neurological: He is oriented to person, place, and time. He has normal strength. He displays no atrophy and normal reflexes. No cranial nerve deficit or sensory deficit. He exhibits normal muscle tone. He displays a negative Romberg sign. He displays no seizure activity. Coordination and gait normal.  Reflex Scores:      Tricep reflexes are 1+ on the right side and 1+ on the left side.      Bicep reflexes are 1+ on the right side and 1+ on the left side.      Brachioradialis reflexes are 1+ on the right side and 1+ on the left side.      Patellar reflexes are 2+ on the right side.      Achilles reflexes are 0 on the right side and 1+ on the left side. Neg SLR in BLE  Skin: Skin is warm and dry. No rash noted. He is not diaphoretic. No erythema. No pallor.  Vitals reviewed.   Lab Results  Component Value Date   WBC 8.2 02/22/2015   HGB 15.0 02/22/2015   HCT 43.9 02/22/2015   PLT 127.0* 02/22/2015   GLUCOSE 80 02/22/2015   CHOL 161 02/22/2015   TRIG 376.0* 02/22/2015   HDL 36.80* 02/22/2015   LDLDIRECT 82.0 02/22/2015   LDLCALC 100* 06/03/2012   ALT 21 02/22/2015   AST 21 02/22/2015   NA 139 02/22/2015   K 3.8 02/22/2015   CL 101 02/22/2015   CREATININE 1.05 02/22/2015   BUN 15 02/22/2015   CO2 30 02/22/2015   TSH 2.50 02/22/2015    Mr Lumbar Spine Wo Contrast  02/01/2014  CLINICAL DATA:  Low back pain which waxes and wanes. EXAM: MRI LUMBAR SPINE WITHOUT CONTRAST TECHNIQUE: Multiplanar, multisequence MR imaging of the lumbar spine was performed. No intravenous contrast was administered. COMPARISON:  04/10/2013 plain films. FINDINGS: The scan extends from T11-12 through mid sacrum. Moderate loss of disc height and signal at L4-5. Endplate reactive changes at the L4-5 level indicate chronic disc space  narrowing, similar to that seen at T12-L1 where there is an anterior protrusion. No worrisome osseous lesions. Normal conus. Paravertebral soft tissues unremarkable. The individual disc spaces were examined with axial images as follows: L1-L2:  Normal. L2-L3:  Normal. L3-L4:  Normal disc space.  Mild facet arthropathy.  No impingement. L4-L5: Central disc osteophyte complex narrows the spinal canal and RIGHT greater than LEFT subarticular zones. Mild facet and ligamentum flavum hypertrophy is superimposed. No definite L5 nerve root impingement in the canal. There is BILATERAL foraminal narrowing which could affect either L4 nerve root. L5-S1:  Mild bulge.  Mild facet arthropathy.  No impingement. IMPRESSION: Multilevel spondylosis as described. Disc space narrowing at L4-5 with BILATERAL foraminal narrowing. Correlate clinically for RIGHT or LEFT L4 nerve root symptomatology. Electronically Signed   By: Davonna BellingJohn  Curnes M.D.   On: 02/01/2014 09:17    Assessment &  Plan:   Monterio was seen today for hypertension and back pain.  Diagnoses and all orders for this visit:  Essential hypertension, benign- His blood pressure is well-controlled, he offers no side effects and his recent electrolytes and renal function were stable. -     olmesartan (BENICAR) 40 MG tablet; Take 1 tablet (40 mg total) by mouth daily.  Depression- improvement noted -     buPROPion (WELLBUTRIN SR) 100 MG 12 hr tablet; Take 1 tablet (100 mg total) by mouth 2 (two) times daily.  DDD (degenerative disc disease), lumbar -     Discontinue: oxyCODONE-acetaminophen (PERCOCET) 10-325 MG tablet; Take 1 tablet by mouth every 6 (six) hours as needed for pain. -     Ambulatory referral to Neurosurgery -     Discontinue: oxyCODONE-acetaminophen (PERCOCET) 10-325 MG tablet; Take 1 tablet by mouth every 6 (six) hours as needed for pain. -     oxyCODONE-acetaminophen (PERCOCET) 10-325 MG tablet; Take 1 tablet by mouth every 6 (six) hours as needed  for pain.  Bilateral low back pain without sciatica -     Discontinue: oxyCODONE-acetaminophen (PERCOCET) 10-325 MG tablet; Take 1 tablet by mouth every 6 (six) hours as needed for pain. -     meloxicam (MOBIC) 15 MG tablet; Take 1 tablet (15 mg total) by mouth daily. -     Discontinue: oxyCODONE-acetaminophen (PERCOCET) 10-325 MG tablet; Take 1 tablet by mouth every 6 (six) hours as needed for pain. -     oxyCODONE-acetaminophen (PERCOCET) 10-325 MG tablet; Take 1 tablet by mouth every 6 (six) hours as needed for pain.  Midline low back pain without sciatica -     Discontinue: oxyCODONE-acetaminophen (PERCOCET) 10-325 MG tablet; Take 1 tablet by mouth every 6 (six) hours as needed for pain. -     meloxicam (MOBIC) 15 MG tablet; Take 1 tablet (15 mg total) by mouth daily. -     Discontinue: oxyCODONE-acetaminophen (PERCOCET) 10-325 MG tablet; Take 1 tablet by mouth every 6 (six) hours as needed for pain. -     oxyCODONE-acetaminophen (PERCOCET) 10-325 MG tablet; Take 1 tablet by mouth every 6 (six) hours as needed for pain.  Spinal stenosis of lumbar region- will continue to treat the pain with Percocet and meloxicam, I've asked him to follow-up  with neurosurgery to see if he is a candidate for surgical remedy. -     Discontinue: oxyCODONE-acetaminophen (PERCOCET) 10-325 MG tablet; Take 1 tablet by mouth every 6 (six) hours as needed for pain. -     meloxicam (MOBIC) 15 MG tablet; Take 1 tablet (15 mg total) by mouth daily. -     Ambulatory referral to Neurosurgery -     Discontinue: oxyCODONE-acetaminophen (PERCOCET) 10-325 MG tablet; Take 1 tablet by mouth every 6 (six) hours as needed for pain. -     oxyCODONE-acetaminophen (PERCOCET) 10-325 MG tablet; Take 1 tablet by mouth every 6 (six) hours as needed for pain.   I have discontinued Mr. Hellmann's ibuprofen. I have also changed his meloxicam. Additionally, I am having him maintain his cetirizine, mometasone, fluocinonide-emollient,  venlafaxine XR, olmesartan, buPROPion, and oxyCODONE-acetaminophen.  Meds ordered this encounter  Medications  . olmesartan (BENICAR) 40 MG tablet    Sig: Take 1 tablet (40 mg total) by mouth daily.    Dispense:  90 tablet    Refill:  3  . buPROPion (WELLBUTRIN SR) 100 MG 12 hr tablet    Sig: Take 1 tablet (100 mg total) by mouth 2 (two)  times daily.    Dispense:  180 tablet    Refill:  1  . DISCONTD: oxyCODONE-acetaminophen (PERCOCET) 10-325 MG tablet    Sig: Take 1 tablet by mouth every 6 (six) hours as needed for pain.    Dispense:  75 tablet    Refill:  0    Fill on or after 08/23/15  . meloxicam (MOBIC) 15 MG tablet    Sig: Take 1 tablet (15 mg total) by mouth daily.    Dispense:  30 tablet    Refill:  11  . DISCONTD: oxyCODONE-acetaminophen (PERCOCET) 10-325 MG tablet    Sig: Take 1 tablet by mouth every 6 (six) hours as needed for pain.    Dispense:  75 tablet    Refill:  0    Fill on or after 09/23/15  . oxyCODONE-acetaminophen (PERCOCET) 10-325 MG tablet    Sig: Take 1 tablet by mouth every 6 (six) hours as needed for pain.    Dispense:  75 tablet    Refill:  0    Fill on or after 10/23/15     Follow-up: Return in about 4 months (around 12/23/2015).  Sanda Linger, MD

## 2015-08-23 NOTE — Patient Instructions (Signed)

## 2015-08-23 NOTE — Progress Notes (Signed)
Pre visit review using our clinic review tool, if applicable. No additional management support is needed unless otherwise documented below in the visit note. 

## 2015-10-06 DIAGNOSIS — M545 Low back pain: Secondary | ICD-10-CM | POA: Diagnosis not present

## 2015-10-06 DIAGNOSIS — Z6833 Body mass index (BMI) 33.0-33.9, adult: Secondary | ICD-10-CM | POA: Diagnosis not present

## 2015-11-22 ENCOUNTER — Telehealth: Payer: Self-pay | Admitting: *Deleted

## 2015-11-22 DIAGNOSIS — M545 Low back pain, unspecified: Secondary | ICD-10-CM

## 2015-11-22 DIAGNOSIS — M48061 Spinal stenosis, lumbar region without neurogenic claudication: Secondary | ICD-10-CM

## 2015-11-22 DIAGNOSIS — M5136 Other intervertebral disc degeneration, lumbar region: Secondary | ICD-10-CM

## 2015-11-22 NOTE — Telephone Encounter (Signed)
Requesting refill on husband pain med Oxycodone. Have appt next month on the 20th, but he is out of meds...Raechel Chute/lmb

## 2015-11-23 MED ORDER — OXYCODONE-ACETAMINOPHEN 10-325 MG PO TABS: 1.0000 | ORAL_TABLET | Freq: Four times a day (QID) | ORAL | Status: DC | PRN

## 2015-11-23 NOTE — Telephone Encounter (Signed)
Rx written.

## 2015-11-23 NOTE — Telephone Encounter (Signed)
Do you have this rx 

## 2015-11-23 NOTE — Telephone Encounter (Signed)
LMOVM, script at front

## 2015-11-24 ENCOUNTER — Telehealth: Payer: Self-pay | Admitting: Internal Medicine

## 2015-11-24 NOTE — Telephone Encounter (Signed)
Patient is requesting refill on percocet  °

## 2015-11-24 NOTE — Telephone Encounter (Signed)
Prior phone note. LMOVM script at front.

## 2015-11-25 NOTE — Telephone Encounter (Signed)
Left vm

## 2015-12-23 ENCOUNTER — Other Ambulatory Visit (INDEPENDENT_AMBULATORY_CARE_PROVIDER_SITE_OTHER): Payer: BLUE CROSS/BLUE SHIELD

## 2015-12-23 ENCOUNTER — Encounter: Payer: Self-pay | Admitting: Internal Medicine

## 2015-12-23 ENCOUNTER — Ambulatory Visit (INDEPENDENT_AMBULATORY_CARE_PROVIDER_SITE_OTHER): Payer: BLUE CROSS/BLUE SHIELD | Admitting: Internal Medicine

## 2015-12-23 VITALS — BP 140/88 | HR 68 | Temp 98.1°F | Resp 16 | Ht 69.0 in | Wt 235.0 lb

## 2015-12-23 DIAGNOSIS — M5136 Other intervertebral disc degeneration, lumbar region: Secondary | ICD-10-CM | POA: Diagnosis not present

## 2015-12-23 DIAGNOSIS — I10 Essential (primary) hypertension: Secondary | ICD-10-CM

## 2015-12-23 DIAGNOSIS — F32A Depression, unspecified: Secondary | ICD-10-CM

## 2015-12-23 DIAGNOSIS — F329 Major depressive disorder, single episode, unspecified: Secondary | ICD-10-CM | POA: Diagnosis not present

## 2015-12-23 DIAGNOSIS — E781 Pure hyperglyceridemia: Secondary | ICD-10-CM

## 2015-12-23 DIAGNOSIS — M545 Low back pain, unspecified: Secondary | ICD-10-CM

## 2015-12-23 DIAGNOSIS — M4806 Spinal stenosis, lumbar region: Secondary | ICD-10-CM

## 2015-12-23 DIAGNOSIS — M48061 Spinal stenosis, lumbar region without neurogenic claudication: Secondary | ICD-10-CM

## 2015-12-23 LAB — BASIC METABOLIC PANEL
BUN: 21 mg/dL (ref 6–23)
CO2: 31 meq/L (ref 19–32)
Calcium: 9.7 mg/dL (ref 8.4–10.5)
Chloride: 103 mEq/L (ref 96–112)
Creatinine, Ser: 1.22 mg/dL (ref 0.40–1.50)
GFR: 67.1 mL/min (ref >60.00)
GLUCOSE: 81 mg/dL (ref 70–99)
POTASSIUM: 4.5 meq/L (ref 3.5–5.1)
SODIUM: 139 meq/L (ref 135–145)

## 2015-12-23 LAB — TRIGLYCERIDES: Triglycerides: 173 mg/dL — ABNORMAL HIGH (ref 0.0–149.0)

## 2015-12-23 MED ORDER — OXYCODONE-ACETAMINOPHEN 10-325 MG PO TABS: 1.0000 | ORAL_TABLET | Freq: Four times a day (QID) | ORAL | Status: DC | PRN

## 2015-12-23 MED ORDER — BUPROPION HCL ER (SR) 100 MG PO TB12: 100.0000 mg | ORAL_TABLET | Freq: Two times a day (BID) | ORAL | Status: DC

## 2015-12-23 MED ORDER — VENLAFAXINE HCL ER 150 MG PO CP24: 150.0000 mg | ORAL_CAPSULE | Freq: Every day | ORAL | Status: DC

## 2015-12-23 NOTE — Progress Notes (Signed)
Subjective:  Patient ID: Brandon Lane, male    DOB: 1965/08/08  Age: 51 y.o. MRN: [id number]  CC: Back Pain; Hypertension; and Hyperlipidemia   HPI Brandon Lane presents for follow-up on chronic, unchanged low back pain and a blood pressure check. He tells me he has persistent, intermittent achy sensation in his lower back that is well-controlled with oxycodone. He has seen a neurosurgeon and has been told that surgery is not an option. He also takes meloxicam as needed for the pain. He tells me his blood pressure has been well controlled on the ARB, he has had no recent episodes of headache/blurred vision/chest pain/palpitations/shortness of breath/edema/or fatigue.  Outpatient Medications Prior to Visit  Medication Sig Dispense Refill  . cetirizine (ZYRTEC) 10 MG tablet Take 1 tablet (10 mg total) by mouth daily. 30 tablet 11  . fluocinonide-emollient (LIDEX-E) 0.05 % cream Apply 1 application topically 2 (two) times daily. 60 g 2  . meloxicam (MOBIC) 15 MG tablet Take 1 tablet (15 mg total) by mouth daily. 30 tablet 11  . mometasone (NASONEX) 50 MCG/ACT nasal spray Place 4 sprays into the nose daily. 17 g 12  . olmesartan (BENICAR) 40 MG tablet Take 1 tablet (40 mg total) by mouth daily. 90 tablet 3  . buPROPion (WELLBUTRIN SR) 100 MG 12 hr tablet Take 1 tablet (100 mg total) by mouth 2 (two) times daily. 180 tablet 1  . oxyCODONE-acetaminophen (PERCOCET) 10-325 MG tablet Take 1 tablet by mouth every 6 (six) hours as needed for pain. 75 tablet 0  . venlafaxine XR (EFFEXOR XR) 150 MG 24 hr capsule Take 1 capsule (150 mg total) by mouth daily with breakfast. 90 capsule 3   No facility-administered medications prior to visit.     ROS Review of Systems  Constitutional: Negative.  Negative for activity change, fatigue and unexpected weight change.  HENT: Negative.   Eyes: Negative for visual disturbance.  Respiratory: Negative for apnea, cough, choking, shortness of breath and  stridor.   Cardiovascular: Negative.  Negative for chest pain, palpitations and leg swelling.  Gastrointestinal: Negative.  Negative for abdominal pain, constipation, diarrhea, nausea and vomiting.  Endocrine: Negative.   Genitourinary: Negative.   Musculoskeletal: Positive for back pain. Negative for arthralgias, gait problem, joint swelling, myalgias and neck pain.  Skin: Negative.   Neurological: Negative.  Negative for dizziness, weakness, numbness and headaches.  Hematological: Negative.  Negative for adenopathy. Does not bruise/bleed easily.  Psychiatric/Behavioral: Positive for dysphoric mood. Negative for decreased concentration, sleep disturbance and suicidal ideas. The patient is not nervous/anxious.     Objective:  BP 140/88   Pulse 68   Temp 98.1 F (36.7 C) (Oral)   Resp 16   Ht  (1.753 m)   Wt 235 lb (106.6 kg)   SpO2 98%   BMI 34.70 kg/m    BP Readings from Last 3 Encounters:  12/23/15 140/88  08/23/15 118/80  05/24/15 138/80    Wt Readings from Last 3 Encounters:  12/23/15 235 lb (106.6 kg)  08/23/15 236 lb (107 kg)  05/24/15 243 lb (110.2 kg)    Physical Exam  Constitutional: He is oriented to person, place, and time. No distress.  HENT:  Mouth/Throat: Oropharynx is clear and moist. No oropharyngeal exudate.  Eyes: Conjunctivae are normal. Right eye exhibits no discharge. Left eye exhibits no discharge. No scleral icterus.  Neck: Normal range of motion. Neck supple. No JVD present. No tracheal deviation present. No thyromegaly present.  Cardiovascular: Normal rate,  regular rhythm, normal heart sounds and intact distal pulses.  Exam reveals no gallop and no friction rub.   No murmur heard. Pulmonary/Chest: Effort normal and breath sounds normal. No stridor. No respiratory distress. He has no wheezes. He has no rales. He exhibits no tenderness.  Abdominal: Soft. Bowel sounds are normal. He exhibits no distension and no mass. There is no tenderness.  There is no rebound and no guarding.  Musculoskeletal: Normal range of motion. He exhibits no edema, tenderness or deformity.  Lymphadenopathy:    He has no cervical adenopathy.  Neurological: He is oriented to person, place, and time. He displays normal reflexes. He exhibits normal muscle tone. Coordination normal.  Neg SLE in BLE  Skin: Skin is warm and dry. No rash noted. He is not diaphoretic. No erythema. No pallor.  Psychiatric: He has a normal mood and affect. His speech is normal and behavior is normal. Cognition and memory are normal.  Vitals reviewed.   Lab Results  Component Value Date   WBC 8.2 02/22/2015   HGB 15.0 02/22/2015   HCT 43.9 02/22/2015   PLT 127.0 (L) 02/22/2015   GLUCOSE 81 12/23/2015   CHOL 161 02/22/2015   TRIG 173.0 (H) 12/23/2015   HDL 36.80 (L) 02/22/2015   LDLDIRECT 82.0 02/22/2015   LDLCALC 100 (H) 06/03/2012   ALT 21 02/22/2015   AST 21 02/22/2015   NA 139 12/23/2015   K 4.5 12/23/2015   CL 103 12/23/2015   CREATININE 1.22 12/23/2015   BUN 21 12/23/2015   CO2 31 12/23/2015   TSH 2.50 02/22/2015    Mr Lumbar Spine Wo Contrast  02/01/2014  CLINICAL DATA:  Low back pain which waxes and wanes. EXAM: MRI LUMBAR SPINE WITHOUT CONTRAST TECHNIQUE: Multiplanar, multisequence MR imaging of the lumbar spine was performed. No intravenous contrast was administered. COMPARISON:  04/10/2013 plain films. FINDINGS: The scan extends from T11-12 through mid sacrum. Moderate loss of disc height and signal at L4-5. Endplate reactive changes at the L4-5 level indicate chronic disc space narrowing, similar to that seen at T12-L1 where there is an anterior protrusion. No worrisome osseous lesions. Normal conus. Paravertebral soft tissues unremarkable. The individual disc spaces were examined with axial images as follows: L1-L2:  Normal. L2-L3:  Normal. L3-L4:  Normal disc space.  Mild facet arthropathy.  No impingement. L4-L5: Central disc osteophyte complex narrows the  spinal canal and RIGHT greater than LEFT subarticular zones. Mild facet and ligamentum flavum hypertrophy is superimposed. No definite L5 nerve root impingement in the canal. There is BILATERAL foraminal narrowing which could affect either L4 nerve root. L5-S1:  Mild bulge.  Mild facet arthropathy.  No impingement. IMPRESSION: Multilevel spondylosis as described. Disc space narrowing at L4-5 with BILATERAL foraminal narrowing. Correlate clinically for RIGHT or LEFT L4 nerve root symptomatology. Electronically Signed   By: Davonna BellingJohn  Curnes M.D.   On: 02/01/2014 09:17    Assessment & Plan:   Brandon DeutscherMartin was seen today for back pain, hypertension and hyperlipidemia.  Diagnoses and all orders for this visit:  Depression -     buPROPion (WELLBUTRIN SR) 100 MG 12 hr tablet; Take 1 tablet (100 mg total) by mouth 2 (two) times daily. -     venlafaxine XR (EFFEXOR XR) 150 MG 24 hr capsule; Take 1 capsule (150 mg total) by mouth daily with breakfast.  DDD (degenerative disc disease), lumbar -     Discontinue: oxyCODONE-acetaminophen (PERCOCET) 10-325 MG tablet; Take 1 tablet by mouth every 6 (six) hours  as needed for pain. -     Discontinue: oxyCODONE-acetaminophen (PERCOCET) 10-325 MG tablet; Take 1 tablet by mouth every 6 (six) hours as needed for pain. -     oxyCODONE-acetaminophen (PERCOCET) 10-325 MG tablet; Take 1 tablet by mouth every 6 (six) hours as needed for pain.  Bilateral low back pain without sciatica -     Discontinue: oxyCODONE-acetaminophen (PERCOCET) 10-325 MG tablet; Take 1 tablet by mouth every 6 (six) hours as needed for pain. -     Discontinue: oxyCODONE-acetaminophen (PERCOCET) 10-325 MG tablet; Take 1 tablet by mouth every 6 (six) hours as needed for pain. -     oxyCODONE-acetaminophen (PERCOCET) 10-325 MG tablet; Take 1 tablet by mouth every 6 (six) hours as needed for pain.  Midline low back pain without sciatica -     Discontinue: oxyCODONE-acetaminophen (PERCOCET) 10-325 MG tablet;  Take 1 tablet by mouth every 6 (six) hours as needed for pain. -     Discontinue: oxyCODONE-acetaminophen (PERCOCET) 10-325 MG tablet; Take 1 tablet by mouth every 6 (six) hours as needed for pain. -     oxyCODONE-acetaminophen (PERCOCET) 10-325 MG tablet; Take 1 tablet by mouth every 6 (six) hours as needed for pain.  Spinal stenosis of lumbar region -     Discontinue: oxyCODONE-acetaminophen (PERCOCET) 10-325 MG tablet; Take 1 tablet by mouth every 6 (six) hours as needed for pain. -     Discontinue: oxyCODONE-acetaminophen (PERCOCET) 10-325 MG tablet; Take 1 tablet by mouth every 6 (six) hours as needed for pain. -     oxyCODONE-acetaminophen (PERCOCET) 10-325 MG tablet; Take 1 tablet by mouth every 6 (six) hours as needed for pain.  Pure hyperglyceridemia- improvement noted with lifestyle modifications. -     Triglycerides; Future  Essential hypertension, benign- his blood pressure is well-controlled, electrolytes and renal function are stable. -     Basic metabolic panel; Future   I am having Brandon Lane maintain his cetirizine, mometasone, fluocinonide-emollient, olmesartan, meloxicam, buPROPion, venlafaxine XR, and oxyCODONE-acetaminophen.  Meds ordered this encounter  Medications  . buPROPion (WELLBUTRIN SR) 100 MG 12 hr tablet    Sig: Take 1 tablet (100 mg total) by mouth 2 (two) times daily.    Dispense:  180 tablet    Refill:  1  . DISCONTD: oxyCODONE-acetaminophen (PERCOCET) 10-325 MG tablet    Sig: Take 1 tablet by mouth every 6 (six) hours as needed for pain.    Dispense:  75 tablet    Refill:  0    Fill on or after 12/23/15  . venlafaxine XR (EFFEXOR XR) 150 MG 24 hr capsule    Sig: Take 1 capsule (150 mg total) by mouth daily with breakfast.    Dispense:  90 capsule    Refill:  1  . DISCONTD: oxyCODONE-acetaminophen (PERCOCET) 10-325 MG tablet    Sig: Take 1 tablet by mouth every 6 (six) hours as needed for pain.    Dispense:  75 tablet    Refill:  0    Fill on or  after 01/23/16  . oxyCODONE-acetaminophen (PERCOCET) 10-325 MG tablet    Sig: Take 1 tablet by mouth every 6 (six) hours as needed for pain.    Dispense:  75 tablet    Refill:  0    Fill on or after 02/23/16     Follow-up: Return in about 3 months (around 03/24/2016).  Sanda Linger, MD

## 2015-12-23 NOTE — Patient Instructions (Signed)

## 2015-12-23 NOTE — Progress Notes (Signed)
Pre visit review using our clinic review tool, if applicable. No additional management support is needed unless otherwise documented below in the visit note. 

## 2016-01-24 ENCOUNTER — Encounter: Payer: Self-pay | Admitting: Internal Medicine

## 2016-01-24 ENCOUNTER — Ambulatory Visit (INDEPENDENT_AMBULATORY_CARE_PROVIDER_SITE_OTHER): Payer: BLUE CROSS/BLUE SHIELD | Admitting: Internal Medicine

## 2016-01-24 ENCOUNTER — Other Ambulatory Visit (INDEPENDENT_AMBULATORY_CARE_PROVIDER_SITE_OTHER): Payer: BLUE CROSS/BLUE SHIELD

## 2016-01-24 VITALS — BP 148/100 | HR 68 | Temp 98.4°F | Resp 16 | Ht 69.0 in | Wt 233.2 lb

## 2016-01-24 DIAGNOSIS — Z79891 Long term (current) use of opiate analgesic: Secondary | ICD-10-CM | POA: Diagnosis not present

## 2016-01-24 DIAGNOSIS — I10 Essential (primary) hypertension: Secondary | ICD-10-CM | POA: Diagnosis not present

## 2016-01-24 LAB — COMPREHENSIVE METABOLIC PANEL
ALT: 18 U/L (ref 0–53)
AST: 18 U/L (ref 0–37)
Albumin: 4.3 g/dL (ref 3.5–5.2)
Alkaline Phosphatase: 92 U/L (ref 39–117)
BUN: 16 mg/dL (ref 6–23)
CALCIUM: 9.4 mg/dL (ref 8.4–10.5)
CHLORIDE: 101 meq/L (ref 96–112)
CO2: 31 meq/L (ref 19–32)
Creatinine, Ser: 1.14 mg/dL (ref 0.40–1.50)
GFR: 72.54 mL/min (ref >60.00)
GLUCOSE: 99 mg/dL (ref 70–99)
POTASSIUM: 4.2 meq/L (ref 3.5–5.1)
Sodium: 138 mEq/L (ref 135–145)
Total Bilirubin: 0.8 mg/dL (ref 0.2–1.2)
Total Protein: 7.2 g/dL (ref 6.0–8.3)

## 2016-01-24 LAB — CBC WITH DIFFERENTIAL/PLATELET
BASOS ABS: 0 10*3/uL (ref 0.0–0.1)
BASOS PCT: 0.4 % (ref 0.0–3.0)
Eosinophils Absolute: 0.3 10*3/uL (ref 0.0–0.7)
Eosinophils Relative: 3.9 % (ref 0.0–5.0)
HEMATOCRIT: 42.3 % (ref 39.0–52.0)
Hemoglobin: 14.8 g/dL (ref 13.0–17.0)
LYMPHS ABS: 2.5 10*3/uL (ref 0.7–4.0)
Lymphocytes Relative: 32.2 % (ref 12.0–46.0)
MCHC: 35 g/dL (ref 30.0–36.0)
MCV: 93.5 fl (ref 78.0–100.0)
MONOS PCT: 9.5 % (ref 3.0–12.0)
Monocytes Absolute: 0.7 10*3/uL (ref 0.1–1.0)
NEUTROS ABS: 4.1 10*3/uL (ref 1.4–7.7)
NEUTROS PCT: 54 % (ref 43.0–77.0)
PLATELETS: 186 10*3/uL (ref 150.0–400.0)
RBC: 4.53 Mil/uL (ref 4.22–5.81)
RDW: 13.2 % (ref 11.5–15.5)
WBC: 7.6 10*3/uL (ref 4.0–10.5)

## 2016-01-24 NOTE — Progress Notes (Signed)
Subjective:  Patient ID: Brandon Lane, male    DOB: [birth year]  Age: 50 y.o. MRN: [id number]  CC: Hypertension   HPI Brandon DresserMartin Lane presents for concerns about a 10 day history of dizziness and lightheadedness. He decided to stop taking Benicar about 5 days ago. He has since then had a mild headache but no nausea or vomiting. He missed work because of the symptoms and wants an FMLA form to be completed.  Outpatient Medications Prior to Visit  Medication Sig Dispense Refill  . buPROPion (WELLBUTRIN SR) 100 MG 12 hr tablet Take 1 tablet (100 mg total) by mouth 2 (two) times daily. 180 tablet 1  . cetirizine (ZYRTEC) 10 MG tablet Take 1 tablet (10 mg total) by mouth daily. 30 tablet 11  . fluocinonide-emollient (LIDEX-E) 0.05 % cream Apply 1 application topically 2 (two) times daily. 60 g 2  . meloxicam (MOBIC) 15 MG tablet Take 1 tablet (15 mg total) by mouth daily. 30 tablet 11  . mometasone (NASONEX) 50 MCG/ACT nasal spray Place 4 sprays into the nose daily. 17 g 12  . olmesartan (BENICAR) 40 MG tablet Take 1 tablet (40 mg total) by mouth daily. 90 tablet 3  . oxyCODONE-acetaminophen (PERCOCET) 10-325 MG tablet Take 1 tablet by mouth every 6 (six) hours as needed for pain. 75 tablet 0  . venlafaxine XR (EFFEXOR XR) 150 MG 24 hr capsule Take 1 capsule (150 mg total) by mouth daily with breakfast. 90 capsule 1   No facility-administered medications prior to visit.     ROS Review of Systems  Constitutional: Negative.  Negative for appetite change, diaphoresis, fatigue and unexpected weight change.  HENT: Negative for sinus pressure and trouble swallowing.   Eyes: Negative.  Negative for photophobia and visual disturbance.  Respiratory: Negative.  Negative for cough, choking, chest tightness, shortness of breath and stridor.   Cardiovascular: Negative.  Negative for chest pain, palpitations and leg swelling.  Gastrointestinal: Negative.  Negative for abdominal pain, constipation,  diarrhea, nausea and vomiting.  Endocrine: Negative.   Genitourinary: Negative.  Negative for decreased urine volume, dysuria and frequency.  Musculoskeletal: Positive for back pain. Negative for arthralgias, myalgias and neck pain.  Skin: Negative.  Negative for color change and rash.  Allergic/Immunologic: Negative.   Neurological: Positive for dizziness, light-headedness and headaches. Negative for syncope, weakness and numbness.  Hematological: Negative.  Negative for adenopathy. Does not bruise/bleed easily.  Psychiatric/Behavioral: Negative.  Negative for decreased concentration, dysphoric mood and sleep disturbance. The patient is not nervous/anxious.     Objective:  BP (!) 148/100 (BP Location: Left Arm, Patient Position: Sitting, Cuff Size: Normal)   Pulse 68   Temp 98.4 F (36.9 C) (Oral)   Resp 16   Ht 5\' 9"  (1.753 m)   Wt 233 lb 4 oz (105.8 kg)   SpO2 97%   BMI 34.45 kg/m   BP Readings from Last 3 Encounters:  01/24/16 (!) 148/100  12/23/15 140/88  08/23/15 118/80    Wt Readings from Last 3 Encounters:  01/24/16 233 lb 4 oz (105.8 kg)  12/23/15 235 lb (106.6 kg)  08/23/15 236 lb (107 kg)    Physical Exam  Constitutional: He is oriented to person, place, and time. No distress.  HENT:  Mouth/Throat: Oropharynx is clear and moist. No oropharyngeal exudate.  Eyes: Conjunctivae are normal. Right eye exhibits no discharge. Left eye exhibits no discharge. No scleral icterus.  Neck: Normal range of motion. Neck supple. No JVD present. No tracheal deviation  present. No thyromegaly present.  Cardiovascular: Normal rate, regular rhythm, normal heart sounds and intact distal pulses.  Exam reveals no gallop and no friction rub.   No murmur heard. EKG -----  Sinus  Rhythm  WITHIN NORMAL LIMITS  Pulmonary/Chest: Effort normal and breath sounds normal. No stridor. No respiratory distress. He has no wheezes. He has no rales. He exhibits no tenderness.  Abdominal: Soft.  Bowel sounds are normal. He exhibits no distension and no mass. There is no tenderness. There is no rebound and no guarding.  Musculoskeletal: Normal range of motion. He exhibits no edema, tenderness or deformity.  Lymphadenopathy:    He has no cervical adenopathy.  Neurological: He is oriented to person, place, and time.  Skin: Skin is warm and dry. No rash noted. He is not diaphoretic. No erythema. No pallor.  Vitals reviewed.   Lab Results  Component Value Date   WBC 7.6 01/24/2016   HGB 14.8 01/24/2016   HCT 42.3 01/24/2016   PLT 186.0 01/24/2016   GLUCOSE 99 01/24/2016   CHOL 161 02/22/2015   TRIG 173.0 (H) 12/23/2015   HDL 36.80 (L) 02/22/2015   LDLDIRECT 82.0 02/22/2015   LDLCALC 100 (H) 06/03/2012   ALT 18 01/24/2016   AST 18 01/24/2016   NA 138 01/24/2016   K 4.2 01/24/2016   CL 101 01/24/2016   CREATININE 1.14 01/24/2016   BUN 16 01/24/2016   CO2 31 01/24/2016   TSH 2.50 02/22/2015    Mr Lumbar Spine Wo Contrast  Result Date: 02/01/2014 CLINICAL DATA:  Low back pain which waxes and wanes. EXAM: MRI LUMBAR SPINE WITHOUT CONTRAST TECHNIQUE: Multiplanar, multisequence MR imaging of the lumbar spine was performed. No intravenous contrast was administered. COMPARISON:  04/10/2013 plain films. FINDINGS: The scan extends from T11-12 through mid sacrum. Moderate loss of disc height and signal at L4-5. Endplate reactive changes at the L4-5 level indicate chronic disc space narrowing, similar to that seen at T12-L1 where there is an anterior protrusion. No worrisome osseous lesions. Normal conus. Paravertebral soft tissues unremarkable. The individual disc spaces were examined with axial images as follows: L1-L2:  Normal. L2-L3:  Normal. L3-L4:  Normal disc space.  Mild facet arthropathy.  No impingement. L4-L5: Central disc osteophyte complex narrows the spinal canal and RIGHT greater than LEFT subarticular zones. Mild facet and ligamentum flavum hypertrophy is superimposed. No  definite L5 nerve root impingement in the canal. There is BILATERAL foraminal narrowing which could affect either L4 nerve root. L5-S1:  Mild bulge.  Mild facet arthropathy.  No impingement. IMPRESSION: Multilevel spondylosis as described. Disc space narrowing at L4-5 with BILATERAL foraminal narrowing. Correlate clinically for RIGHT or LEFT L4 nerve root symptomatology. Electronically Signed   By: Davonna BellingJohn  Curnes M.D.   On: 02/01/2014 09:17    Assessment & Plan:   Daphine DeutscherMartin was seen today for hypertension.  Diagnoses and all orders for this visit:  Essential hypertension, benign- His exam, EKG, and all of his lab work are normal. His blood sugar is not adequately well controlled I think this explains his symptoms. I've asked him to restart the Benicar as previously directed. FMLA paperwork was completed at his request. -     CBC with Differential/Platelet; Future -     Comprehensive metabolic panel; Future -     EKG 12-Lead   I am having Mr. Sowles maintain his cetirizine, mometasone, fluocinonide-emollient, olmesartan, meloxicam, buPROPion, venlafaxine XR, and oxyCODONE-acetaminophen.  No orders of the defined types were placed in this  encounter.    Follow-up: Return in about 3 weeks (around 02/14/2016).  Sanda Linger, MD

## 2016-01-24 NOTE — Progress Notes (Signed)
Pre visit review using our clinic review tool, if applicable. No additional management support is needed unless otherwise documented below in the visit note. 

## 2016-01-24 NOTE — Patient Instructions (Signed)
Hypertension Hypertension, commonly called high blood pressure, is when the force of blood pumping through your arteries is too strong. Your arteries are the blood vessels that carry blood from your heart throughout your body. A blood pressure reading consists of a higher number over a lower number, such as 110/72. The higher number (systolic) is the pressure inside your arteries when your heart pumps. The lower number (diastolic) is the pressure inside your arteries when your heart relaxes. Ideally you want your blood pressure below 120/80. Hypertension forces your heart to work harder to pump blood. Your arteries may become narrow or stiff. Having untreated or uncontrolled hypertension can cause heart attack, stroke, kidney disease, and other problems. RISK FACTORS Some risk factors for high blood pressure are controllable. Others are not.  Risk factors you cannot control include:   Race. You may be at higher risk if you are African American.  Age. Risk increases with age.  Gender. Men are at higher risk than women before age 45 years. After age 65, women are at higher risk than men. Risk factors you can control include:  Not getting enough exercise or physical activity.  Being overweight.  Getting too much fat, sugar, calories, or salt in your diet.  Drinking too much alcohol. SIGNS AND SYMPTOMS Hypertension does not usually cause signs or symptoms. Extremely high blood pressure (hypertensive crisis) may cause headache, anxiety, shortness of breath, and nosebleed. DIAGNOSIS To check if you have hypertension, your health care provider will measure your blood pressure while you are seated, with your arm held at the level of your heart. It should be measured at least twice using the same arm. Certain conditions can cause a difference in blood pressure between your right and left arms. A blood pressure reading that is higher than normal on one occasion does not mean that you need treatment. If  it is not clear whether you have high blood pressure, you may be asked to return on a different day to have your blood pressure checked again. Or, you may be asked to monitor your blood pressure at home for 1 or more weeks. TREATMENT Treating high blood pressure includes making lifestyle changes and possibly taking medicine. Living a healthy lifestyle can help lower high blood pressure. You may need to change some of your habits. Lifestyle changes may include:  Following the DASH diet. This diet is high in fruits, vegetables, and whole grains. It is low in salt, red meat, and added sugars.  Keep your sodium intake below 2,300 mg per day.  Getting at least 30-45 minutes of aerobic exercise at least 4 times per week.  Losing weight if necessary.  Not smoking.  Limiting alcoholic beverages.  Learning ways to reduce stress. Your health care provider may prescribe medicine if lifestyle changes are not enough to get your blood pressure under control, and if one of the following is true:  You are 18-59 years of age and your systolic blood pressure is above 140.  You are 60 years of age or older, and your systolic blood pressure is above 150.  Your diastolic blood pressure is above 90.  You have diabetes, and your systolic blood pressure is over 140 or your diastolic blood pressure is over 90.  You have kidney disease and your blood pressure is above 140/90.  You have heart disease and your blood pressure is above 140/90. Your personal target blood pressure may vary depending on your medical conditions, your age, and other factors. HOME CARE INSTRUCTIONS    Have your blood pressure rechecked as directed by your health care provider.   Take medicines only as directed by your health care provider. Follow the directions carefully. Blood pressure medicines must be taken as prescribed. The medicine does not work as well when you skip doses. Skipping doses also puts you at risk for  problems.  Do not smoke.   Monitor your blood pressure at home as directed by your health care provider. SEEK MEDICAL CARE IF:   You think you are having a reaction to medicines taken.  You have recurrent headaches or feel dizzy.  You have swelling in your ankles.  You have trouble with your vision. SEEK IMMEDIATE MEDICAL CARE IF:  You develop a severe headache or confusion.  You have unusual weakness, numbness, or feel faint.  You have severe chest or abdominal pain.  You vomit repeatedly.  You have trouble breathing. MAKE SURE YOU:   Understand these instructions.  Will watch your condition.  Will get help right away if you are not doing well or get worse.   This information is not intended to replace advice given to you by your health care provider. Make sure you discuss any questions you have with your health care provider.   Document Released: 05/22/2005 Document Revised: 10/06/2014 Document Reviewed: 03/14/2013 Elsevier Interactive Patient Education 2016 [employer]  

## 2016-01-31 ENCOUNTER — Telehealth: Payer: Self-pay

## 2016-01-31 NOTE — Telephone Encounter (Signed)
FMLA paper work filled out and faxed back on 01/26/2016.  Patient informed of same.

## 2016-02-15 ENCOUNTER — Ambulatory Visit: Payer: BLUE CROSS/BLUE SHIELD | Admitting: Internal Medicine

## 2016-03-27 ENCOUNTER — Telehealth: Payer: Self-pay | Admitting: Internal Medicine

## 2016-03-27 ENCOUNTER — Encounter: Payer: Self-pay | Admitting: Internal Medicine

## 2016-03-27 ENCOUNTER — Ambulatory Visit (INDEPENDENT_AMBULATORY_CARE_PROVIDER_SITE_OTHER): Payer: BLUE CROSS/BLUE SHIELD | Admitting: Internal Medicine

## 2016-03-27 VITALS — BP 144/80 | HR 74 | Temp 98.1°F | Resp 16 | Ht 69.0 in | Wt 240.0 lb

## 2016-03-27 DIAGNOSIS — I10 Essential (primary) hypertension: Secondary | ICD-10-CM

## 2016-03-27 DIAGNOSIS — M5136 Other intervertebral disc degeneration, lumbar region: Secondary | ICD-10-CM

## 2016-03-27 DIAGNOSIS — M48061 Spinal stenosis, lumbar region without neurogenic claudication: Secondary | ICD-10-CM

## 2016-03-27 DIAGNOSIS — G4733 Obstructive sleep apnea (adult) (pediatric): Secondary | ICD-10-CM | POA: Diagnosis not present

## 2016-03-27 DIAGNOSIS — M545 Low back pain, unspecified: Secondary | ICD-10-CM

## 2016-03-27 DIAGNOSIS — F3342 Major depressive disorder, recurrent, in full remission: Secondary | ICD-10-CM

## 2016-03-27 MED ORDER — OXYCODONE-ACETAMINOPHEN 10-325 MG PO TABS: 1.0000 | ORAL_TABLET | Freq: Four times a day (QID) | ORAL | 0 refills | Status: DC | PRN

## 2016-03-27 MED ORDER — OLMESARTAN MEDOXOMIL 40 MG PO TABS: 40.0000 mg | ORAL_TABLET | Freq: Every day | ORAL | 3 refills | Status: DC

## 2016-03-27 MED ORDER — VENLAFAXINE HCL ER 150 MG PO CP24: 150.0000 mg | ORAL_CAPSULE | Freq: Every day | ORAL | 1 refills | Status: DC

## 2016-03-27 NOTE — Progress Notes (Signed)
Subjective:  Patient ID: Brandon Lane, male    DOB: [birth year]  Age: 50 y.o. MRN: [id number]  CC: Hypertension and Back Pain   HPI Brandon DresserMartin Lane presents for Follow-up. He tells me his had no back pain for over 2 weeks and is hasn't taken Percocet in 2 weeks. He also tells me his blood pressures been well controlled and he has had no recent episodes of headache, blurred vision, chest pain, shortness of breath.  He does complain of ongoing fatigue and excessive daytime somnolence. He says during his days off he sleeps through the day. He has a history of sleep apnea that was treated with CPAP but he is currently not wearing his CPAP device.  Outpatient Medications Prior to Visit  Medication Sig Dispense Refill  . cetirizine (ZYRTEC) 10 MG tablet Take 1 tablet (10 mg total) by mouth daily. 30 tablet 11  . fluocinonide-emollient (LIDEX-E) 0.05 % cream Apply 1 application topically 2 (two) times daily. 60 g 2  . meloxicam (MOBIC) 15 MG tablet Take 1 tablet (15 mg total) by mouth daily. 30 tablet 11  . mometasone (NASONEX) 50 MCG/ACT nasal spray Place 4 sprays into the nose daily. 17 g 12  . buPROPion (WELLBUTRIN SR) 100 MG 12 hr tablet Take 1 tablet (100 mg total) by mouth 2 (two) times daily. 180 tablet 1  . olmesartan (BENICAR) 40 MG tablet Take 1 tablet (40 mg total) by mouth daily. 90 tablet 3  . oxyCODONE-acetaminophen (PERCOCET) 10-325 MG tablet Take 1 tablet by mouth every 6 (six) hours as needed for pain. 75 tablet 0  . venlafaxine XR (EFFEXOR XR) 150 MG 24 hr capsule Take 1 capsule (150 mg total) by mouth daily with breakfast. 90 capsule 1   No facility-administered medications prior to visit.     ROS Review of Systems  Constitutional: Negative.  Negative for appetite change, chills, diaphoresis, fatigue and fever.  HENT: Negative.  Negative for congestion, sinus pressure and trouble swallowing.   Eyes: Negative for visual disturbance.  Respiratory: Positive for apnea.  Negative for cough, choking, chest tightness, shortness of breath, wheezing and stridor.        He complains of heavy snoring and daytime somnolence with fatigue, he is currently not using his CPAP machine because he says it doesn't fit well.  Cardiovascular: Negative.  Negative for chest pain, palpitations and leg swelling.  Gastrointestinal: Negative.  Negative for abdominal pain, constipation, diarrhea, nausea and vomiting.  Endocrine: Negative.   Genitourinary: Negative.  Negative for decreased urine volume, difficulty urinating, dysuria and urgency.  Musculoskeletal: Negative for arthralgias, back pain, joint swelling, myalgias and neck pain.  Skin: Negative.  Negative for color change and rash.  Allergic/Immunologic: Negative.   Neurological: Negative.  Negative for dizziness, weakness and numbness.  Hematological: Negative.  Negative for adenopathy. Does not bruise/bleed easily.  Psychiatric/Behavioral: Negative.     Objective:  BP (!) 144/80 (BP Location: Left Arm, Patient Position: Sitting, Cuff Size: Large)   Pulse 74   Temp 98.1 F (36.7 C) (Oral)   Resp 16   Ht 5\' 9"  (1.753 m)   Wt 240 lb (108.9 kg)   SpO2 96%   BMI 35.44 kg/m   BP Readings from Last 3 Encounters:  03/27/16 (!) 144/80  01/24/16 (!) 148/100  12/23/15 140/88    Wt Readings from Last 3 Encounters:  03/27/16 240 lb (108.9 kg)  01/24/16 233 lb 4 oz (105.8 kg)  12/23/15 235 lb (106.6 kg)  Physical Exam  Constitutional: He is oriented to person, place, and time. No distress.  HENT:  Mouth/Throat: Oropharynx is clear and moist. No oropharyngeal exudate.  Eyes: Conjunctivae are normal. Right eye exhibits no discharge. Left eye exhibits no discharge. No scleral icterus.  Neck: Normal range of motion. Neck supple. No JVD present. No tracheal deviation present. No thyromegaly present.  Cardiovascular: Normal rate, regular rhythm, normal heart sounds and intact distal pulses.  Exam reveals no gallop and  no friction rub.   No murmur heard. Pulmonary/Chest: Effort normal and breath sounds normal. No stridor. No respiratory distress. He has no wheezes. He has no rales. He exhibits no tenderness.  Abdominal: Soft. Bowel sounds are normal. He exhibits no distension and no mass. There is no tenderness. There is no rebound and no guarding.  Musculoskeletal: Normal range of motion. He exhibits no edema, tenderness or deformity.  Lymphadenopathy:    He has no cervical adenopathy.  Neurological: He is oriented to person, place, and time.  Skin: Skin is warm and dry. No rash noted. He is not diaphoretic. No erythema. No pallor.  Psychiatric: He has a normal mood and affect. His behavior is normal. Judgment and thought content normal.  Vitals reviewed.   Lab Results  Component Value Date   WBC 7.6 01/24/2016   HGB 14.8 01/24/2016   HCT 42.3 01/24/2016   PLT 186.0 01/24/2016   GLUCOSE 99 01/24/2016   CHOL 161 02/22/2015   TRIG 173.0 (H) 12/23/2015   HDL 36.80 (L) 02/22/2015   LDLDIRECT 82.0 02/22/2015   LDLCALC 100 (H) 06/03/2012   ALT 18 01/24/2016   AST 18 01/24/2016   NA 138 01/24/2016   K 4.2 01/24/2016   CL 101 01/24/2016   CREATININE 1.14 01/24/2016   BUN 16 01/24/2016   CO2 31 01/24/2016   TSH 2.50 02/22/2015    Brandon Lane Wo Contrast  Result Date: 02/01/2014 CLINICAL DATA:  Low back pain which waxes and wanes. EXAM: MRI LUMBAR Lane WITHOUT CONTRAST TECHNIQUE: Multiplanar, multisequence Brandon imaging of the lumbar Lane was performed. No intravenous contrast was administered. COMPARISON:  04/10/2013 plain films. FINDINGS: The scan extends from T11-12 through mid sacrum. Moderate loss of disc height and signal at L4-5. Endplate reactive changes at the L4-5 level indicate chronic disc space narrowing, similar to that seen at T12-L1 where there is an anterior protrusion. No worrisome osseous lesions. Normal conus. Paravertebral soft tissues unremarkable. The individual disc spaces  were examined with axial images as follows: L1-L2:  Normal. L2-L3:  Normal. L3-L4:  Normal disc space.  Mild facet arthropathy.  No impingement. L4-L5: Central disc osteophyte complex narrows the spinal canal and RIGHT greater than LEFT subarticular zones. Mild facet and ligamentum flavum hypertrophy is superimposed. No definite L5 nerve root impingement in the canal. There is BILATERAL foraminal narrowing which could affect either L4 nerve root. L5-S1:  Mild bulge.  Mild facet arthropathy.  No impingement. IMPRESSION: Multilevel spondylosis as described. Disc space narrowing at L4-5 with BILATERAL foraminal narrowing. Correlate clinically for RIGHT or LEFT L4 nerve root symptomatology. Electronically Signed   By: Davonna Belling M.D.   On: 02/01/2014 09:17    Assessment & Plan:   Jkai was seen today for hypertension and back pain.  Diagnoses and all orders for this visit:  OSA (obstructive sleep apnea) -     Ambulatory referral to Pulmonology  DDD (degenerative disc disease), lumbar- I initially wrote a prescription for Percocet but then he told me he  didn't need it so I destroyed it. -     Discontinue: oxyCODONE-acetaminophen (PERCOCET) 10-325 MG tablet; Take 1 tablet by mouth every 6 (six) hours as needed for pain.  Bilateral low back pain without sciatica, unspecified chronicity -     Discontinue: oxyCODONE-acetaminophen (PERCOCET) 10-325 MG tablet; Take 1 tablet by mouth every 6 (six) hours as needed for pain.  Essential hypertension, benign- his blood pressure is adequately well controlled. -     olmesartan (BENICAR) 40 MG tablet; Take 1 tablet (40 mg total) by mouth daily.  Spinal stenosis of lumbar region without neurogenic claudication  Recurrent major depressive disorder, in full remission (HCC)- he has not been compliant with Wellbutrin but he is doing well on Effexor so I will continue it. -     venlafaxine XR (EFFEXOR XR) 150 MG 24 hr capsule; Take 1 capsule (150 mg total) by  mouth daily with breakfast.   I have discontinued Brandon. Bays's buPROPion, oxyCODONE-acetaminophen, and oxyCODONE-acetaminophen. I am also having him maintain his cetirizine, mometasone, fluocinonide-emollient, meloxicam, venlafaxine XR, and olmesartan.  Meds ordered this encounter  Medications  . venlafaxine XR (EFFEXOR XR) 150 MG 24 hr capsule    Sig: Take 1 capsule (150 mg total) by mouth daily with breakfast.    Dispense:  90 capsule    Refill:  1  . DISCONTD: oxyCODONE-acetaminophen (PERCOCET) 10-325 MG tablet    Sig: Take 1 tablet by mouth every 6 (six) hours as needed for pain.    Dispense:  75 tablet    Refill:  0    Fill on or after 03/27/16  . olmesartan (BENICAR) 40 MG tablet    Sig: Take 1 tablet (40 mg total) by mouth daily.    Dispense:  90 tablet    Refill:  3     Follow-up: Return in about 4 months (around 07/28/2016).  Sanda Linger, MD

## 2016-03-27 NOTE — Telephone Encounter (Signed)
Per stefannie request I called the patients wife. Advised that med was d/c per the patients convo with the md this morning. She wanted him to take the medication. Advised that this was the information I had received. She states that he will make a follow up appt

## 2016-03-27 NOTE — Progress Notes (Signed)
Pre visit review using our clinic review tool, if applicable. No additional management support is needed unless otherwise documented below in the visit note. 

## 2016-03-27 NOTE — Patient Instructions (Signed)

## 2016-03-27 NOTE — Telephone Encounter (Signed)
Patient wife is calling to request that we send in percocet to pharmacy  Bilateral low back pain without sciatica, unspecified chronicity -     Discontinue: oxyCODONE-acetaminophen (PERCOCET) 10-325 MG tablet; Take 1 tablet by mouth every 6 (six) hours as needed for pain.

## 2016-03-28 NOTE — Telephone Encounter (Signed)
Forwarding to PCP as an FYI 

## 2016-03-28 NOTE — Telephone Encounter (Signed)
The patient yesterday told me that his back pain had resolved and he had not taken Percocet in over 2 weeks. I therefore discontinued the prescription that had been written.

## 2016-04-03 ENCOUNTER — Ambulatory Visit (INDEPENDENT_AMBULATORY_CARE_PROVIDER_SITE_OTHER): Payer: BLUE CROSS/BLUE SHIELD | Admitting: Internal Medicine

## 2016-04-03 ENCOUNTER — Encounter: Payer: Self-pay | Admitting: Internal Medicine

## 2016-04-03 VITALS — BP 128/80 | HR 80 | Temp 97.8°F | Resp 16 | Ht 69.0 in | Wt 237.8 lb

## 2016-04-03 DIAGNOSIS — M109 Gout, unspecified: Secondary | ICD-10-CM

## 2016-04-03 MED ORDER — METHYLPREDNISOLONE ACETATE 80 MG/ML IJ SUSP
120.0000 mg | Freq: Once | INTRAMUSCULAR | Status: AC
  Administered 2016-04-03: 120 mg via INTRAMUSCULAR

## 2016-04-03 MED ORDER — COLCHICINE 0.6 MG PO CAPS: 1.0000 | ORAL_CAPSULE | Freq: Two times a day (BID) | ORAL | 3 refills | Status: DC

## 2016-04-03 MED ORDER — INDOMETHACIN 50 MG PO CAPS: 50.0000 mg | ORAL_CAPSULE | Freq: Three times a day (TID) | ORAL | 1 refills | Status: DC

## 2016-04-03 NOTE — Patient Instructions (Signed)

## 2016-04-03 NOTE — Progress Notes (Signed)
Pre visit review using our clinic review tool, if applicable. No additional management support is needed unless otherwise documented below in the visit note. 

## 2016-04-03 NOTE — Progress Notes (Signed)
Subjective:  Patient ID: Brandon Lane, male    DOB: 26-Aug-1965  Age: 50 y.o. MRN: [id number]  CC: Gout   HPI Brandon DresserMartin Lane presents for a 4 day history of nontraumatic pain, redness, swelling in his right foot at the base of the big toe. He has a prescription for Mobic but has not been taking it.  Outpatient Medications Prior to Visit  Medication Sig Dispense Refill  . cetirizine (ZYRTEC) 10 MG tablet Take 1 tablet (10 mg total) by mouth daily. 30 tablet 11  . fluocinonide-emollient (LIDEX-E) 0.05 % cream Apply 1 application topically 2 (two) times daily. 60 g 2  . mometasone (NASONEX) 50 MCG/ACT nasal spray Place 4 sprays into the nose daily. 17 g 12  . olmesartan (BENICAR) 40 MG tablet Take 1 tablet (40 mg total) by mouth daily. 90 tablet 3  . venlafaxine XR (EFFEXOR XR) 150 MG 24 hr capsule Take 1 capsule (150 mg total) by mouth daily with breakfast. 90 capsule 1  . meloxicam (MOBIC) 15 MG tablet Take 1 tablet (15 mg total) by mouth daily. 30 tablet 11   No facility-administered medications prior to visit.     ROS Review of Systems  Constitutional: Negative.  Negative for chills and fever.  HENT: Negative.  Negative for trouble swallowing.   Eyes: Negative.   Respiratory: Negative.  Negative for cough, chest tightness, shortness of breath and stridor.   Cardiovascular: Negative for chest pain, palpitations and leg swelling.  Gastrointestinal: Negative for abdominal pain, diarrhea, nausea and vomiting.  Endocrine: Negative.   Genitourinary: Negative.   Musculoskeletal: Positive for arthralgias. Negative for back pain, myalgias and neck pain.  Skin: Negative.  Negative for color change and rash.  Allergic/Immunologic: Negative.   Neurological: Negative.   Hematological: Negative.  Negative for adenopathy. Does not bruise/bleed easily.    Objective:  BP 128/80 (BP Location: Left Arm, Patient Position: Sitting, Cuff Size: Large)   Pulse 80   Temp 97.8 F (36.6 C) (Oral)    Resp 16   Ht 5\' 9"  (1.753 m)   Wt 237 lb 12 oz (107.8 kg)   SpO2 95%   BMI 35.11 kg/m   BP Readings from Last 3 Encounters:  04/03/16 128/80  03/27/16 (!) 144/80  01/24/16 (!) 148/100    Wt Readings from Last 3 Encounters:  04/03/16 237 lb 12 oz (107.8 kg)  03/27/16 240 lb (108.9 kg)  01/24/16 233 lb 4 oz (105.8 kg)    Physical Exam  Constitutional: He is oriented to person, place, and time. No distress.  HENT:  Mouth/Throat: No oropharyngeal exudate.  Eyes: Right eye exhibits no discharge. Left eye exhibits no discharge. No scleral icterus.  Neck: Normal range of motion. Neck supple. No JVD present. No tracheal deviation present. No thyromegaly present.  Cardiovascular: Normal rate, regular rhythm, normal heart sounds and intact distal pulses.  Exam reveals no gallop and no friction rub.   No murmur heard. Pulmonary/Chest: Effort normal and breath sounds normal. No stridor. No respiratory distress. He has no wheezes. He has no rales. He exhibits no tenderness.  Abdominal: Soft. Bowel sounds are normal. He exhibits no distension. There is no tenderness. There is no rebound and no guarding.  Musculoskeletal: He exhibits tenderness.       Feet:  Lymphadenopathy:    He has no cervical adenopathy.  Neurological: He is oriented to person, place, and time.  Skin: Skin is warm and dry. He is not diaphoretic.  Vitals reviewed.  Lab Results  Component Value Date   WBC 7.6 01/24/2016   HGB 14.8 01/24/2016   HCT 42.3 01/24/2016   PLT 186.0 01/24/2016   GLUCOSE 99 01/24/2016   CHOL 161 02/22/2015   TRIG 173.0 (H) 12/23/2015   HDL 36.80 (L) 02/22/2015   LDLDIRECT 82.0 02/22/2015   LDLCALC 100 (H) 06/03/2012   ALT 18 01/24/2016   AST 18 01/24/2016   NA 138 01/24/2016   K 4.2 01/24/2016   CL 101 01/24/2016   CREATININE 1.14 01/24/2016   BUN 16 01/24/2016   CO2 31 01/24/2016   TSH 2.50 02/22/2015    Mr Lumbar Spine Wo Contrast  Result Date: 02/01/2014 CLINICAL DATA:   Low back pain which waxes and wanes. EXAM: MRI LUMBAR SPINE WITHOUT CONTRAST TECHNIQUE: Multiplanar, multisequence MR imaging of the lumbar spine was performed. No intravenous contrast was administered. COMPARISON:  04/10/2013 plain films. FINDINGS: The scan extends from T11-12 through mid sacrum. Moderate loss of disc height and signal at L4-5. Endplate reactive changes at the L4-5 level indicate chronic disc space narrowing, similar to that seen at T12-L1 where there is an anterior protrusion. No worrisome osseous lesions. Normal conus. Paravertebral soft tissues unremarkable. The individual disc spaces were examined with axial images as follows: L1-L2:  Normal. L2-L3:  Normal. L3-L4:  Normal disc space.  Mild facet arthropathy.  No impingement. L4-L5: Central disc osteophyte complex narrows the spinal canal and RIGHT greater than LEFT subarticular zones. Mild facet and ligamentum flavum hypertrophy is superimposed. No definite L5 nerve root impingement in the canal. There is BILATERAL foraminal narrowing which could affect either L4 nerve root. L5-S1:  Mild bulge.  Mild facet arthropathy.  No impingement. IMPRESSION: Multilevel spondylosis as described. Disc space narrowing at L4-5 with BILATERAL foraminal narrowing. Correlate clinically for RIGHT or LEFT L4 nerve root symptomatology. Electronically Signed   By: Davonna BellingJohn  Curnes M.D.   On: 02/01/2014 09:17    Assessment & Plan:   Brandon Lane was seen today for gout.  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 He will rest, ice, elevate the right foot as much as possible for the next week. -     Colchicine (MITIGARE) 0.6 MG CAPS; Take 1 capsule by mouth 2 (two) times daily. -     indomethacin (INDOCIN) 50 MG capsule; Take 1 capsule (50 mg  total) by mouth 3 (three) times daily with meals. -     methylPREDNISolone acetate (DEPO-MEDROL) injection 120 mg; Inject 1.5 mLs (120 mg total) into the muscle once.   I have discontinued Brandon Lane's meloxicam. I am also having him start on Colchicine and indomethacin. Additionally, I am having him maintain his cetirizine, mometasone, fluocinonide-emollient, venlafaxine XR, and olmesartan. We administered methylPREDNISolone acetate.  Meds ordered this encounter  Medications  . Colchicine (MITIGARE) 0.6 MG CAPS    Sig: Take 1 capsule by mouth 2 (two) times daily.    Dispense:  60 capsule    Refill:  3  . indomethacin (INDOCIN) 50 MG capsule    Sig: Take 1 capsule (50 mg total) by mouth 3 (three) times daily with meals.    Dispense:  90 capsule    Refill:  1  . methylPREDNISolone acetate (DEPO-MEDROL) injection 120 mg     Follow-up: Return  in about 3 weeks (around 04/24/2016).  Sanda Linger, MD

## 2016-04-07 ENCOUNTER — Telehealth: Payer: Self-pay

## 2016-04-07 NOTE — Telephone Encounter (Signed)
FMLA paperwork received for pt. Forms were previously completed by PCP's assistant (see phone note 01/31/2016).  Forwarding forms to assistant for completion

## 2016-04-13 ENCOUNTER — Encounter: Payer: Self-pay | Admitting: Pulmonary Disease

## 2016-04-13 ENCOUNTER — Ambulatory Visit (INDEPENDENT_AMBULATORY_CARE_PROVIDER_SITE_OTHER): Payer: BLUE CROSS/BLUE SHIELD | Admitting: Pulmonary Disease

## 2016-04-13 DIAGNOSIS — G4733 Obstructive sleep apnea (adult) (pediatric): Secondary | ICD-10-CM

## 2016-04-13 NOTE — Addendum Note (Signed)
Addended by: Garfield CorneaMABRY, JASMINE L on: 04/13/2016 05:33 PM   Modules accepted: Orders

## 2016-04-13 NOTE — Patient Instructions (Signed)
Prescription will be sent to [facility] for nasal pillows and another CPAP supplies  Try using this and see if he'll tolerate CPAP better If not, call us back in 2-4 weeks and we will refer you to a dentist for a dental mouthguard for sleep apnea  You may need a home sleep study prior to this

## 2016-04-13 NOTE — Progress Notes (Signed)
Subjective:    Patient ID: Brandon Lane, male    DOB: 07-14-1965, 50 y.o.   MRN: [id number]  HPI  50 year old ex-smoker for FU of obstructive sleep apnea.    He has degenerative disc disease and takes an occasional Percocet about 1-2 per day.  He quit smoking in 2000 and smoked about a pack per day for 15 years prior to that.     Chief Complaint  Patient presents with  . Follow-up    Pt. has OSA,Pt. c/o daytime somnolence, Pt. was using a CPAP machine but he said he has not been able to sleep with it because of the mask   He was diagnosed with mild OSA with RDI 10/hour and 2014. He was started on CPAP with full face mask with improvement in his daytime somnolence and fatigue. Download was reviewed and showed average pressure of 9 cm then. Unfortunately was unable to continue using the mask-his main complaint is about the mask interface, he did not like this he abstain over his face and having to remove it when he would go to the bathroom. He wonders if simple oxygen would suffice  He continues to remain sleepy Epworth sleepiness score is 8 and a report sleepiness as a passenger in a car and while watching TV. He does not have problems driving and is trying to work is short. He used to work the third shift for now works the second shift about 8 hours gets home around 4 AM and sleeps until noon. Loud snoring has been noted by his wife. No witnessed apneas.  There is no history suggestive of cataplexy, sleep paralysis or parasomnias   Significant tests/ events  11/2012  PSG >> mild obstructive sleep apnea with RDI 10/h    5047m download 02/2013 -cpap very effective, avg pr 9 cm >>Changed to 9 cm Download 04/2013 - 9 cm very effective , no residuals, avg usage 4h, few missed days   Spirometry 2014 >> no  evidence of airway obstruction with FEV1 of 96%.    Past Medical History:  Diagnosis Date  . DDD (degenerative disc disease), lumbar   . Depression   . GERD (gastroesophageal  reflux disease)      No past surgical history on file.   No Known Allergies   Social History   Social History  . Marital status: Married    Spouse name: N/A  . Number of children: 5  . Years of education: 10   Occupational History  .  [employer]   Social History Main Topics  . Smoking status: Former Smoker    Packs/day: 1.00    Years: 20.00    Types: Cigarettes    Quit date: 09/21/1998  . Smokeless tobacco: Never Used  . Alcohol use 6.0 oz/week    10 Cans of beer per week  . Drug use: No  . Sexual activity: Yes   Other Topics Concern  . Not on file   Social History Narrative   Caffienated beverages- Yes   Seat belts use-Yes   Smoke Alarm in home-yes   Guns / fireman- No   Physical abuse- No     Family History  Problem Relation Age of Onset  . Arthritis Mother   . Hyperlipidemia Mother   . Stroke Mother   . Hypertension Mother   . Kidney disease Mother   . Diabetes Mother   . Alcohol abuse Father   . Hypertension Sister   . Cancer Neg Hx   . Early  death Neg Hx   . Heart disease Brother   . Hypertension Brother   . Stroke Brother     Review of Systems neg for any significant sore throat, dysphagia, itching, sneezing, nasal congestion or excess/ purulent secretions, fever, chills, sweats, unintended wt loss, pleuritic or exertional cp, hempoptysis, orthopnea pnd or change in chronic leg swelling. Also denies presyncope, palpitations, heartburn, abdominal pain, nausea, vomiting, diarrhea or change in bowel or urinary habits, dysuria,hematuria, rash, arthralgias, visual complaints, headache, numbness weakness or ataxia.      Objective:   Physical Exam  Gen. Pleasant, obese, in no distress, normal affect ENT - no lesions, no post nasal drip, class 2 airway, Retrognathia, large stone Neck: No JVD, no thyromegaly, no carotid bruits Lungs: no use of accessory muscles, no dullness to percussion, decreased without rales or rhonchi  Cardiovascular: Rhythm  regular, heart sounds  normal, no murmurs or gallops, no peripheral edema Abdomen: soft and non-tender, no hepatosplenomegaly, BS normal. Musculoskeletal: No deformities, no cyanosis or clubbing Neuro:  alert, non focal, no tremors       Assessment & Plan:

## 2016-04-13 NOTE — Assessment & Plan Note (Signed)
The reason to treat him as for his sleepiness and quality of life. His main complaint is with the mask interface and I feel that by changing this we should be able to help him  Pres[facility] be [facility] to [facility] for nasal pillows and another CPAP supplies  Try using this and see if he'll tolerate CPAP better If not, call us back in 2-4 weeks and we will refer you to a dentist for a dental mouthguard for sleep apnea  You may need a home sleep study prior to this

## 2016-04-13 NOTE — Telephone Encounter (Signed)
Pt has requested follow up regarding same.  Would you please forward forms back to me for expedited completion? Thanks.

## 2016-04-14 NOTE — Telephone Encounter (Signed)
FMLA completed and faxed. [facility] and copy sent to [facility]. Pt spouse informed of same and that the original is up front for pt to pick up at his convenience.

## 2016-04-24 ENCOUNTER — Encounter: Payer: Self-pay | Admitting: Internal Medicine

## 2016-04-24 ENCOUNTER — Ambulatory Visit (INDEPENDENT_AMBULATORY_CARE_PROVIDER_SITE_OTHER): Payer: BLUE CROSS/BLUE SHIELD | Admitting: Internal Medicine

## 2016-04-24 ENCOUNTER — Other Ambulatory Visit (INDEPENDENT_AMBULATORY_CARE_PROVIDER_SITE_OTHER): Payer: BLUE CROSS/BLUE SHIELD

## 2016-04-24 VITALS — BP 140/90 | HR 82 | Temp 98.0°F | Resp 16 | Ht 69.0 in | Wt 241.8 lb

## 2016-04-24 DIAGNOSIS — I10 Essential (primary) hypertension: Secondary | ICD-10-CM

## 2016-04-24 DIAGNOSIS — M1000 Idiopathic gout, unspecified site: Secondary | ICD-10-CM

## 2016-04-24 DIAGNOSIS — M48061 Spinal stenosis, lumbar region without neurogenic claudication: Secondary | ICD-10-CM

## 2016-04-24 DIAGNOSIS — M5136 Other intervertebral disc degeneration, lumbar region: Secondary | ICD-10-CM | POA: Diagnosis not present

## 2016-04-24 DIAGNOSIS — M545 Low back pain, unspecified: Secondary | ICD-10-CM

## 2016-04-24 DIAGNOSIS — G8929 Other chronic pain: Secondary | ICD-10-CM

## 2016-04-24 LAB — BASIC METABOLIC PANEL
BUN: 18 mg/dL (ref 6–23)
CALCIUM: 9.7 mg/dL (ref 8.4–10.5)
CO2: 30 mEq/L (ref 19–32)
CREATININE: 1.09 mg/dL (ref 0.40–1.50)
Chloride: 101 mEq/L (ref 96–112)
GFR: 76.31 mL/min (ref >60.00)
GLUCOSE: 124 mg/dL — AB (ref 70–99)
POTASSIUM: 3.5 meq/L (ref 3.5–5.1)
Sodium: 139 mEq/L (ref 135–145)

## 2016-04-24 LAB — URIC ACID: Uric Acid, Serum: 7.6 mg/dL (ref 4.0–7.8)

## 2016-04-24 MED ORDER — OXYCODONE-ACETAMINOPHEN 7.5-325 MG PO TABS: 1.0000 | ORAL_TABLET | Freq: Three times a day (TID) | ORAL | 0 refills | Status: DC | PRN

## 2016-04-24 MED ORDER — FEBUXOSTAT 40 MG PO TABS: 40.0000 mg | ORAL_TABLET | Freq: Every day | ORAL | 3 refills | Status: DC

## 2016-04-24 NOTE — Patient Instructions (Signed)

## 2016-04-24 NOTE — Progress Notes (Signed)
Pre visit review using our clinic review tool, if applicable. No additional management support is needed unless otherwise documented below in the visit note. 

## 2016-04-24 NOTE — Progress Notes (Signed)
Subjective:  Patient ID: Brandon Lane, male    DOB: July 05, 1965  Age: 50 y.o. MRN: [id number]  CC: Back Pain and Hypertension   HPI Brandon DresserMartin Curbow presents for follow-up after recent episode of gout in his right foot. He tells me that the pain is swelling in his right foot has resolved. He does however complain of recurrent episodes of low back pain. When I last saw him he told me he had stopped taking Percocet but now asks for a refill. 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 He complains of aching and stabbing in his lower back with no radiation his lower extremities and no N/W/T in his legs. He tells me the indomethacin has not helped his low back pain.  Outpatient Medications Prior to Visit  Medication Sig Dispense Refill  . cetirizine (ZYRTEC) 10 MG tablet Take 1 tablet (10 mg total) by mouth daily. 30 tablet 11  . Colchicine (MITIGARE) 0.6 MG CAPS Take 1 capsule by mouth 2 (two) times daily. 60 capsule 3  . fluocinonide-emollient (LIDEX-E) 0.05 % cream Apply 1 application topically 2 (two) times daily. 60 g 2  . indomethacin (INDOCIN) 50 MG capsule Take 1 capsule (50 mg total) by mouth 3 (three) times daily with meals. 90 capsule 1  . mometasone (NASONEX) 50 MCG/ACT nasal spray Place 4 sprays into the nose daily. 17 g 12  . olmesartan (BENICAR) 40 MG tablet Take 1 tablet (40 mg total) by mouth daily. 90 tablet 3  . venlafaxine XR (EFFEXOR XR) 150 MG 24 hr capsule Take 1 capsule (150 mg total) by mouth daily with breakfast. 90 capsule 1   No facility-administered medications prior to visit.     ROS Review of Systems  Constitutional: Negative.  Negative for unexpected weight change.  HENT: Negative.   Eyes: Negative for visual disturbance.  Respiratory: Negative for cough, choking, chest tightness, shortness of breath and stridor.   Cardiovascular: Negative for chest pain, palpitations and leg swelling.  Gastrointestinal:  Negative.  Negative for abdominal pain, constipation, diarrhea, nausea and vomiting.  Endocrine: Negative.   Genitourinary: Negative.  Negative for difficulty urinating.  Musculoskeletal: Positive for back pain. Negative for arthralgias, joint swelling and neck pain.  Skin: Negative.   Allergic/Immunologic: Negative.   Neurological: Negative.   Hematological: Negative.  Negative for adenopathy. Does not bruise/bleed easily.  Psychiatric/Behavioral: Negative.     Objective:  BP 140/90 (BP Location: Right Arm, Patient Position: Sitting, Cuff Size: Large)   Pulse 82   Temp 98 F (36.7 C) (Oral)   Resp 16   Ht 5\' 9"  (1.753 m)   Wt 241 lb 12 oz (109.7 kg)   SpO2 92%   BMI 35.70 kg/m   BP Readings from Last 3 Encounters:  04/24/16 140/90  04/13/16 122/78  04/03/16 128/80    Wt Readings from Last 3 Encounters:  04/24/16 241 lb 12 oz (109.7 kg)  04/13/16 243 lb 3.2 oz (110.3 kg)  04/03/16 237 lb 12 oz (107.8 kg)    Physical Exam  Constitutional: He is oriented to person, place, and time. No distress.  HENT:  Mouth/Throat: Oropharynx is clear and moist. No oropharyngeal exudate.  Eyes: Conjunctivae are normal. Right eye exhibits no discharge. Left eye exhibits no discharge. No scleral icterus.  Neck: Normal range of motion. Neck supple. No JVD present. No tracheal deviation present. No thyromegaly present.  Cardiovascular: Normal rate, regular rhythm, normal heart  sounds and intact distal pulses.  Exam reveals no gallop and no friction rub.   No murmur heard. Pulmonary/Chest: Effort normal and breath sounds normal. No stridor. No respiratory distress. He has no wheezes. He has no rales. He exhibits no tenderness.  Abdominal: Soft. Bowel sounds are normal. He exhibits no distension and no mass. There is no tenderness. There is no rebound and no guarding.  Musculoskeletal: Normal range of motion. He exhibits no edema, tenderness or deformity.  Lymphadenopathy:    He has no  cervical adenopathy.  Neurological: He is oriented to person, place, and time.  Neg SLR in BLE  Skin: Skin is warm and dry. No rash noted. He is not diaphoretic. No erythema. No pallor.  Vitals reviewed.   Lab Results  Component Value Date   WBC 7.6 01/24/2016   HGB 14.8 01/24/2016   HCT 42.3 01/24/2016   PLT 186.0 01/24/2016   GLUCOSE 124 (H) 04/24/2016   CHOL 161 02/22/2015   TRIG 173.0 (H) 12/23/2015   HDL 36.80 (L) 02/22/2015   LDLDIRECT 82.0 02/22/2015   LDLCALC 100 (H) 06/03/2012   ALT 18 01/24/2016   AST 18 01/24/2016   NA 139 04/24/2016   K 3.5 04/24/2016   CL 101 04/24/2016   CREATININE 1.09 04/24/2016   BUN 18 04/24/2016   CO2 30 04/24/2016   TSH 2.50 02/22/2015    Mr Lumbar Spine Wo Contrast  Result Date: 02/01/2014 CLINICAL DATA:  Low back pain which waxes and wanes. EXAM: MRI LUMBAR SPINE WITHOUT CONTRAST TECHNIQUE: Multiplanar, multisequence MR imaging of the lumbar spine was performed. No intravenous contrast was administered. COMPARISON:  04/10/2013 plain films. FINDINGS: The scan extends from T11-12 through mid sacrum. Moderate loss of disc height and signal at L4-5. Endplate reactive changes at the L4-5 level indicate chronic disc space narrowing, similar to that seen at T12-L1 where there is an anterior protrusion. No worrisome osseous lesions. Normal conus. Paravertebral soft tissues unremarkable. The individual disc spaces were examined with axial images as follows: L1-L2:  Normal. L2-L3:  Normal. L3-L4:  Normal disc space.  Mild facet arthropathy.  No impingement. L4-L5: Central disc osteophyte complex narrows the spinal canal and RIGHT greater than LEFT subarticular zones. Mild facet and ligamentum flavum hypertrophy is superimposed. No definite L5 nerve root impingement in the canal. There is BILATERAL foraminal narrowing which could affect either L4 nerve root. L5-S1:  Mild bulge.  Mild facet arthropathy.  No impingement. IMPRESSION: Multilevel spondylosis as  described. Disc space narrowing at L4-5 with BILATERAL foraminal narrowing. Correlate clinically for RIGHT or LEFT L4 nerve root symptomatology. Electronically Signed   By: Davonna BellingJohn  Curnes M.D.   On: 02/01/2014 09:17    Assessment & Plan:   Daphine DeutscherMartin was seen today for back pain and hypertension.  Diagnoses and all orders for this visit:  Idiopathic gout, unspecified chronicity, unspecified site- the acute episode of gout has resolved, his uric acid level is above 6 of asked him to start Uloric to lower the uric acid level and to prevent future episodes of gout. -     Uric acid; Future -     Basic metabolic panel; Future -     febuxostat (ULORIC) 40 MG tablet; Take 1 tablet (40 mg total) by mouth daily.  Essential hypertension, benign- his blood pressure is well-controlled, electrolytes and renal function are stable. -     Basic metabolic panel; Future  DDD (degenerative disc disease), lumbar -     oxyCODONE-acetaminophen (PERCOCET) 7.5-325 MG  tablet; Take 1 tablet by mouth every 8 (eight) hours as needed.  Chronic low back pain without sciatica, unspecified back pain laterality -     oxyCODONE-acetaminophen (PERCOCET) 7.5-325 MG tablet; Take 1 tablet by mouth every 8 (eight) hours as needed.  Spinal stenosis of lumbar region without neurogenic claudication -     oxyCODONE-acetaminophen (PERCOCET) 7.5-325 MG tablet; Take 1 tablet by mouth every 8 (eight) hours as needed.   I am having Mr. Rohrig start on oxyCODONE-acetaminophen and febuxostat. I am also having him maintain his cetirizine, mometasone, fluocinonide-emollient, venlafaxine XR, olmesartan, Colchicine, and indomethacin.  Meds ordered this encounter  Medications  . oxyCODONE-acetaminophen (PERCOCET) 7.5-325 MG tablet    Sig: Take 1 tablet by mouth every 8 (eight) hours as needed.    Dispense:  75 tablet    Refill:  0  . febuxostat (ULORIC) 40 MG tablet    Sig: Take 1 tablet (40 mg total) by mouth daily.    Dispense:  90  tablet    Refill:  3     Follow-up: Return in about 6 months (around 10/22/2016).  Sanda Linger, MD

## 2016-05-24 ENCOUNTER — Encounter: Payer: Self-pay | Admitting: Internal Medicine

## 2016-05-24 ENCOUNTER — Ambulatory Visit (INDEPENDENT_AMBULATORY_CARE_PROVIDER_SITE_OTHER): Payer: BLUE CROSS/BLUE SHIELD | Admitting: Internal Medicine

## 2016-05-24 VITALS — BP 130/80 | HR 76 | Temp 98.0°F | Resp 16 | Ht 69.0 in | Wt 242.0 lb

## 2016-05-24 DIAGNOSIS — M545 Low back pain: Secondary | ICD-10-CM | POA: Diagnosis not present

## 2016-05-24 DIAGNOSIS — M5136 Other intervertebral disc degeneration, lumbar region: Secondary | ICD-10-CM

## 2016-05-24 DIAGNOSIS — Z23 Encounter for immunization: Secondary | ICD-10-CM

## 2016-05-24 DIAGNOSIS — M48061 Spinal stenosis, lumbar region without neurogenic claudication: Secondary | ICD-10-CM

## 2016-05-24 DIAGNOSIS — Z79891 Long term (current) use of opiate analgesic: Secondary | ICD-10-CM | POA: Diagnosis not present

## 2016-05-24 DIAGNOSIS — G8929 Other chronic pain: Secondary | ICD-10-CM

## 2016-05-24 MED ORDER — OXYCODONE-ACETAMINOPHEN 7.5-325 MG PO TABS: 1.0000 | ORAL_TABLET | Freq: Three times a day (TID) | ORAL | 0 refills | Status: DC | PRN

## 2016-05-24 NOTE — Patient Instructions (Signed)

## 2016-05-24 NOTE — Progress Notes (Signed)
Pre visit review using our clinic review tool, if applicable. No additional management support is needed unless otherwise documented below in the visit note. 

## 2016-05-24 NOTE — Progress Notes (Signed)
Subjective:  Patient ID: Brandon Lane, male    DOB: 05/02/1966  Age: 50 y.o. MRN: [id number]  CC: Back Pain   HPI Brandon DresserMartin Lane presents for f/up on LBP - He tells me that he takes Percocet on a daily basis and it controls the back pain. He said he took his last dose within 12 hours of this visit.Marland Kitchen. He also requests a Tdap booster since he will be a grandfather in Feb and the child will be living in his home.  Outpatient Medications Prior to Visit  Medication Sig Dispense Refill  . cetirizine (ZYRTEC) 10 MG tablet Take 1 tablet (10 mg total) by mouth daily. 30 tablet 11  . Colchicine (MITIGARE) 0.6 MG CAPS Take 1 capsule by mouth 2 (two) times daily. 60 capsule 3  . febuxostat (ULORIC) 40 MG tablet Take 1 tablet (40 mg total) by mouth daily. 90 tablet 3  . fluocinonide-emollient (LIDEX-E) 0.05 % cream Apply 1 application topically 2 (two) times daily. 60 g 2  . indomethacin (INDOCIN) 50 MG capsule Take 1 capsule (50 mg total) by mouth 3 (three) times daily with meals. 90 capsule 1  . mometasone (NASONEX) 50 MCG/ACT nasal spray Place 4 sprays into the nose daily. 17 g 12  . olmesartan (BENICAR) 40 MG tablet Take 1 tablet (40 mg total) by mouth daily. 90 tablet 3  . venlafaxine XR (EFFEXOR XR) 150 MG 24 hr capsule Take 1 capsule (150 mg total) by mouth daily with breakfast. 90 capsule 1  . oxyCODONE-acetaminophen (PERCOCET) 7.5-325 MG tablet Take 1 tablet by mouth every 8 (eight) hours as needed. 75 tablet 0   No facility-administered medications prior to visit.     ROS Review of Systems  Constitutional: Negative for activity change, appetite change, diaphoresis, fatigue and unexpected weight change.  HENT: Negative for trouble swallowing.   Eyes: Negative for visual disturbance.  Respiratory: Negative for cough, chest tightness, shortness of breath and wheezing.   Cardiovascular: Negative for chest pain, palpitations and leg swelling.  Gastrointestinal: Negative for abdominal pain,  constipation, diarrhea, nausea and vomiting.  Endocrine: Negative.   Genitourinary: Negative.  Negative for difficulty urinating, dysuria and frequency.  Musculoskeletal: Positive for back pain. Negative for arthralgias, joint swelling and myalgias.  Skin: Negative for color change.  Allergic/Immunologic: Negative.   Neurological: Negative.   Hematological: Negative.  Negative for adenopathy. Does not bruise/bleed easily.  Psychiatric/Behavioral: Negative.     Objective:  BP 130/80 (BP Location: Left Arm, Patient Position: Sitting, Cuff Size: Normal)   Pulse 76   Temp 98 F (36.7 C) (Oral)   Ht 5\' 9"  (1.753 m)   Wt 242 lb (109.8 kg)   SpO2 98%   BMI 35.74 kg/m   BP Readings from Last 3 Encounters:  05/24/16 130/80  04/24/16 140/90  04/13/16 122/78    Wt Readings from Last 3 Encounters:  05/24/16 242 lb (109.8 kg)  04/24/16 241 lb 12 oz (109.7 kg)  04/13/16 243 lb 3.2 oz (110.3 kg)    Physical Exam  Constitutional: He is oriented to person, place, and time. No distress.  HENT:  Mouth/Throat: Oropharynx is clear and moist. No oropharyngeal exudate.  Eyes: Conjunctivae are normal. Right eye exhibits no discharge. Left eye exhibits no discharge. No scleral icterus.  Neck: Normal range of motion. Neck supple. No JVD present. No tracheal deviation present. No thyromegaly present.  Cardiovascular: Normal rate, regular rhythm, normal heart sounds and intact distal pulses.  Exam reveals no gallop and no  friction rub.   No murmur heard. Pulmonary/Chest: Effort normal and breath sounds normal. No stridor. No respiratory distress. He has no wheezes. He has no rales. He exhibits no tenderness.  Abdominal: Soft. Bowel sounds are normal. He exhibits no distension and no mass. There is no tenderness. There is no rebound and no guarding.  Musculoskeletal: Normal range of motion. He exhibits no edema, tenderness or deformity.  Lymphadenopathy:    He has no cervical adenopathy.    Neurological: He is alert and oriented to person, place, and time. He has normal reflexes. He displays normal reflexes. He exhibits normal muscle tone. Coordination normal.  Neg SLR in BLE  Skin: Skin is warm and dry. No rash noted. He is not diaphoretic. No erythema. No pallor.  Vitals reviewed.   Lab Results  Component Value Date   WBC 7.6 01/24/2016   HGB 14.8 01/24/2016   HCT 42.3 01/24/2016   PLT 186.0 01/24/2016   GLUCOSE 124 (H) 04/24/2016   CHOL 161 02/22/2015   TRIG 173.0 (H) 12/23/2015   HDL 36.80 (L) 02/22/2015   LDLDIRECT 82.0 02/22/2015   LDLCALC 100 (H) 06/03/2012   ALT 18 01/24/2016   AST 18 01/24/2016   NA 139 04/24/2016   K 3.5 04/24/2016   CL 101 04/24/2016   CREATININE 1.09 04/24/2016   BUN 18 04/24/2016   CO2 30 04/24/2016   TSH 2.50 02/22/2015    Mr Lumbar Spine Wo Contrast  Result Date: 02/01/2014 CLINICAL DATA:  Low back pain which waxes and wanes. EXAM: MRI LUMBAR SPINE WITHOUT CONTRAST TECHNIQUE: Multiplanar, multisequence MR imaging of the lumbar spine was performed. No intravenous contrast was administered. COMPARISON:  04/10/2013 plain films. FINDINGS: The scan extends from T11-12 through mid sacrum. Moderate loss of disc height and signal at L4-5. Endplate reactive changes at the L4-5 level indicate chronic disc space narrowing, similar to that seen at T12-L1 where there is an anterior protrusion. No worrisome osseous lesions. Normal conus. Paravertebral soft tissues unremarkable. The individual disc spaces were examined with axial images as follows: L1-L2:  Normal. L2-L3:  Normal. L3-L4:  Normal disc space.  Mild facet arthropathy.  No impingement. L4-L5: Central disc osteophyte complex narrows the spinal canal and RIGHT greater than LEFT subarticular zones. Mild facet and ligamentum flavum hypertrophy is superimposed. No definite L5 nerve root impingement in the canal. There is BILATERAL foraminal narrowing which could affect either L4 nerve root.  L5-S1:  Mild bulge.  Mild facet arthropathy.  No impingement. IMPRESSION: Multilevel spondylosis as described. Disc space narrowing at L4-5 with BILATERAL foraminal narrowing. Correlate clinically for RIGHT or LEFT L4 nerve root symptomatology. Electronically Signed   By: Davonna BellingJohn  Curnes M.D.   On: 02/01/2014 09:17    Assessment & Plan:   Brandon Lane was seen today for back pain.  Diagnoses and all orders for this visit:  Need for prophylactic vaccination with combined diphtheria-tetanus-pertussis (DTP) vaccine -     Tdap vaccine greater than or equal to 7yo IM  DDD (degenerative disc disease), lumbar -     oxyCODONE-acetaminophen (PERCOCET) 7.5-325 MG tablet; Take 1 tablet by mouth every 8 (eight) hours as needed.  Chronic low back pain without sciatica, unspecified back pain laterality -     oxyCODONE-acetaminophen (PERCOCET) 7.5-325 MG tablet; Take 1 tablet by mouth every 8 (eight) hours as needed.  Spinal stenosis of lumbar region without neurogenic claudication -     oxyCODONE-acetaminophen (PERCOCET) 7.5-325 MG tablet; Take 1 tablet by mouth every 8 (eight) hours  as needed.   I am having Brandon Lane maintain his cetirizine, mometasone, fluocinonide-emollient, venlafaxine XR, olmesartan, Colchicine, indomethacin, febuxostat, and oxyCODONE-acetaminophen.  Meds ordered this encounter  Medications  . oxyCODONE-acetaminophen (PERCOCET) 7.5-325 MG tablet    Sig: Take 1 tablet by mouth every 8 (eight) hours as needed.    Dispense:  75 tablet    Refill:  0     Follow-up: Return in about 4 months (around 09/22/2016).  Sanda Linger, MD

## 2016-05-25 ENCOUNTER — Encounter: Payer: Self-pay | Admitting: Internal Medicine

## 2016-06-21 ENCOUNTER — Ambulatory Visit: Payer: BLUE CROSS/BLUE SHIELD | Admitting: Internal Medicine

## 2016-06-22 ENCOUNTER — Ambulatory Visit: Payer: BLUE CROSS/BLUE SHIELD | Admitting: Family

## 2016-07-03 ENCOUNTER — Ambulatory Visit (INDEPENDENT_AMBULATORY_CARE_PROVIDER_SITE_OTHER): Payer: BLUE CROSS/BLUE SHIELD | Admitting: Internal Medicine

## 2016-07-03 ENCOUNTER — Encounter: Payer: Self-pay | Admitting: Internal Medicine

## 2016-07-03 ENCOUNTER — Other Ambulatory Visit (INDEPENDENT_AMBULATORY_CARE_PROVIDER_SITE_OTHER): Payer: BLUE CROSS/BLUE SHIELD

## 2016-07-03 VITALS — BP 128/78 | HR 68 | Temp 98.2°F | Resp 16 | Ht 69.0 in | Wt 235.0 lb

## 2016-07-03 DIAGNOSIS — M5136 Other intervertebral disc degeneration, lumbar region: Secondary | ICD-10-CM

## 2016-07-03 DIAGNOSIS — M48061 Spinal stenosis, lumbar region without neurogenic claudication: Secondary | ICD-10-CM

## 2016-07-03 DIAGNOSIS — M109 Gout, unspecified: Secondary | ICD-10-CM | POA: Diagnosis not present

## 2016-07-03 DIAGNOSIS — R739 Hyperglycemia, unspecified: Secondary | ICD-10-CM | POA: Diagnosis not present

## 2016-07-03 DIAGNOSIS — Z0000 Encounter for general adult medical examination without abnormal findings: Secondary | ICD-10-CM | POA: Diagnosis not present

## 2016-07-03 DIAGNOSIS — I10 Essential (primary) hypertension: Secondary | ICD-10-CM | POA: Diagnosis not present

## 2016-07-03 DIAGNOSIS — L239 Allergic contact dermatitis, unspecified cause: Secondary | ICD-10-CM | POA: Diagnosis not present

## 2016-07-03 DIAGNOSIS — M1000 Idiopathic gout, unspecified site: Secondary | ICD-10-CM

## 2016-07-03 DIAGNOSIS — G8929 Other chronic pain: Secondary | ICD-10-CM

## 2016-07-03 DIAGNOSIS — R7989 Other specified abnormal findings of blood chemistry: Secondary | ICD-10-CM | POA: Diagnosis not present

## 2016-07-03 DIAGNOSIS — M545 Low back pain, unspecified: Secondary | ICD-10-CM

## 2016-07-03 LAB — COMPREHENSIVE METABOLIC PANEL
ALBUMIN: 4.4 g/dL (ref 3.5–5.2)
ALK PHOS: 92 U/L (ref 39–117)
ALT: 20 U/L (ref 0–53)
AST: 15 U/L (ref 0–37)
BILIRUBIN TOTAL: 0.8 mg/dL (ref 0.2–1.2)
BUN: 17 mg/dL (ref 6–23)
CO2: 30 mEq/L (ref 19–32)
Calcium: 9.6 mg/dL (ref 8.4–10.5)
Chloride: 102 mEq/L (ref 96–112)
Creatinine, Ser: 1.16 mg/dL (ref 0.40–1.50)
GFR: 70.97 mL/min (ref >60.00)
Glucose, Bld: 94 mg/dL (ref 70–99)
Potassium: 4.1 mEq/L (ref 3.5–5.1)
Sodium: 139 mEq/L (ref 135–145)
TOTAL PROTEIN: 6.9 g/dL (ref 6.0–8.3)

## 2016-07-03 LAB — CBC WITH DIFFERENTIAL/PLATELET
BASOS ABS: 0.1 10*3/uL (ref 0.0–0.1)
Basophils Relative: 0.8 % (ref 0.0–3.0)
Eosinophils Absolute: 0.2 10*3/uL (ref 0.0–0.7)
Eosinophils Relative: 3.2 % (ref 0.0–5.0)
HCT: 41.7 % (ref 39.0–52.0)
HEMOGLOBIN: 14.3 g/dL (ref 13.0–17.0)
Lymphocytes Relative: 27.6 % (ref 12.0–46.0)
Lymphs Abs: 2.1 10*3/uL (ref 0.7–4.0)
MCHC: 34.4 g/dL (ref 30.0–36.0)
MCV: 95.2 fl (ref 78.0–100.0)
MONO ABS: 0.8 10*3/uL (ref 0.1–1.0)
MONOS PCT: 11 % (ref 3.0–12.0)
NEUTROS PCT: 57.4 % (ref 43.0–77.0)
Neutro Abs: 4.4 10*3/uL (ref 1.4–7.7)
Platelets: 221 10*3/uL (ref 150.0–400.0)
RBC: 4.39 Mil/uL (ref 4.22–5.81)
RDW: 12.5 % (ref 11.5–15.5)
WBC: 7.7 10*3/uL (ref 4.0–10.5)

## 2016-07-03 LAB — LIPID PANEL
CHOLESTEROL: 151 mg/dL (ref 0–200)
HDL: 36.2 mg/dL — AB (ref >39.00)
NonHDL: 114.94
TRIGLYCERIDES: 216 mg/dL — AB (ref 0.0–149.0)
Total CHOL/HDL Ratio: 4
VLDL: 43.2 mg/dL — ABNORMAL HIGH (ref 0.0–40.0)

## 2016-07-03 LAB — TSH: TSH: 1.01 u[IU]/mL (ref 0.35–4.50)

## 2016-07-03 LAB — HEMOGLOBIN A1C: Hgb A1c MFr Bld: 5.3 % (ref 4.6–6.5)

## 2016-07-03 LAB — PSA: PSA: 0.23 ng/mL (ref 0.10–4.00)

## 2016-07-03 LAB — LDL CHOLESTEROL, DIRECT: LDL DIRECT: 75 mg/dL

## 2016-07-03 LAB — URIC ACID: URIC ACID, SERUM: 6.7 mg/dL (ref 4.0–7.8)

## 2016-07-03 MED ORDER — OXYCODONE-ACETAMINOPHEN 7.5-325 MG PO TABS
1.0000 | ORAL_TABLET | Freq: Three times a day (TID) | ORAL | 0 refills | Status: DC | PRN
Start: 2016-07-03 — End: 2016-09-21

## 2016-07-03 MED ORDER — OXYCODONE-ACETAMINOPHEN 7.5-325 MG PO TABS: 1.0000 | ORAL_TABLET | Freq: Three times a day (TID) | ORAL | 0 refills | Status: DC | PRN

## 2016-07-03 MED ORDER — FLUOCINONIDE-E 0.05 % EX CREA: 1.0000 "application " | TOPICAL_CREAM | Freq: Two times a day (BID) | CUTANEOUS | 2 refills | Status: DC

## 2016-07-03 NOTE — Progress Notes (Signed)
Subjective:  Patient ID: Brandon Lane, male    DOB: 12-29-1965  Age: 51 y.o. MRN: [id number]  CC: Annual Exam; Hypertension; and Back Pain   HPI Brandon Lane presents for a CPX.  He complains of chronic, intermittent, nonradiating low back pain that is well-controlled with intermittent opiate therapy. He complains of chronic itchy splotches on his lower extremities that has responded well in the past to topical steroids. He tells me his blood pressure has been well controlled. He has had no recent episodes of chest pain, DOE, SOB, palpitations, edema, fatigue.  Outpatient Medications Prior to Visit  Medication Sig Dispense Refill  . cetirizine (ZYRTEC) 10 MG tablet Take 1 tablet (10 mg total) by mouth daily. 30 tablet 11  . Colchicine (MITIGARE) 0.6 MG CAPS Take 1 capsule by mouth 2 (two) times daily. 60 capsule 3  . febuxostat (ULORIC) 40 MG tablet Take 1 tablet (40 mg total) by mouth daily. 90 tablet 3  . mometasone (NASONEX) 50 MCG/ACT nasal spray Place 4 sprays into the nose daily. 17 g 12  . olmesartan (BENICAR) 40 MG tablet Take 1 tablet (40 mg total) by mouth daily. 90 tablet 3  . venlafaxine XR (EFFEXOR XR) 150 MG 24 hr capsule Take 1 capsule (150 mg total) by mouth daily with breakfast. 90 capsule 1  . fluocinonide-emollient (LIDEX-E) 0.05 % cream Apply 1 application topically 2 (two) times daily. 60 g 2  . indomethacin (INDOCIN) 50 MG capsule Take 1 capsule (50 mg total) by mouth 3 (three) times daily with meals. 90 capsule 1  . oxyCODONE-acetaminophen (PERCOCET) 7.5-325 MG tablet Take 1 tablet by mouth every 8 (eight) hours as needed. 75 tablet 0   No facility-administered medications prior to visit.     ROS Review of Systems  Constitutional: Negative for activity change, appetite change, diaphoresis, fatigue and unexpected weight change.  HENT: Negative.   Eyes: Negative for visual disturbance.  Respiratory: Negative for cough, chest tightness, shortness of breath,  wheezing and stridor.   Cardiovascular: Negative for chest pain, palpitations and leg swelling.  Gastrointestinal: Negative for abdominal pain, constipation, diarrhea, nausea and vomiting.  Genitourinary: Negative.  Negative for difficulty urinating, dysuria, hematuria, scrotal swelling and testicular pain.  Musculoskeletal: Positive for back pain. Negative for arthralgias, joint swelling, myalgias and neck pain.  Skin: Positive for rash. Negative for color change, pallor and wound.  Allergic/Immunologic: Negative.   Neurological: Negative.  Negative for dizziness, weakness, numbness and headaches.  Hematological: Negative for adenopathy. Does not bruise/bleed easily.  Psychiatric/Behavioral: Negative.  Negative for dysphoric mood, sleep disturbance and suicidal ideas. The patient is not nervous/anxious.     Objective:  BP 128/78   Pulse 68   Temp 98.2 F (36.8 C) (Oral)   Resp 16   Ht 5\' 9"  (1.753 m)   Wt 235 lb (106.6 kg)   SpO2 98%   BMI 34.70 kg/m   BP Readings from Last 3 Encounters:  07/03/16 128/78  05/24/16 130/80  04/24/16 140/90    Wt Readings from Last 3 Encounters:  07/03/16 235 lb (106.6 kg)  05/24/16 242 lb (109.8 kg)  04/24/16 241 lb 12 oz (109.7 kg)    Physical Exam  Constitutional: He is oriented to person, place, and time. No distress.  HENT:  Mouth/Throat: Oropharynx is clear and moist. No oropharyngeal exudate.  Eyes: Conjunctivae are normal. Right eye exhibits no discharge. Left eye exhibits no discharge. No scleral icterus.  Neck: Normal range of motion. Neck supple. No JVD  present. No tracheal deviation present. No thyromegaly present.  Cardiovascular: Normal rate, regular rhythm, normal heart sounds and intact distal pulses.  Exam reveals no gallop and no friction rub.   No murmur heard. Pulmonary/Chest: Effort normal and breath sounds normal. No stridor. No respiratory distress. He has no wheezes. He has no rales. He exhibits no tenderness.    Abdominal: Soft. Bowel sounds are normal. He exhibits no distension and no mass. There is no tenderness. There is no rebound and no guarding.  Genitourinary:  Genitourinary Comments: He was not willing to undress for a GU or rectal examination  Musculoskeletal: Normal range of motion. He exhibits no edema, tenderness or deformity.  Lymphadenopathy:    He has no cervical adenopathy.  Neurological: He is oriented to person, place, and time.  Skin: Skin is warm and dry. Rash noted. Rash is papular. He is not diaphoretic. No erythema. No pallor.  Over both lower extremities, located anteriorly, there are excoriated patches of scaly papules consistent with eczema  Psychiatric: He has a normal mood and affect. His behavior is normal. Judgment and thought content normal.  Vitals reviewed.   Lab Results  Component Value Date   WBC 7.7 07/03/2016   HGB 14.3 07/03/2016   HCT 41.7 07/03/2016   PLT 221.0 07/03/2016   GLUCOSE 94 07/03/2016   CHOL 151 07/03/2016   TRIG 216.0 (H) 07/03/2016   HDL 36.20 (L) 07/03/2016   LDLDIRECT 75.0 07/03/2016   LDLCALC 100 (H) 06/03/2012   ALT 20 07/03/2016   AST 15 07/03/2016   NA 139 07/03/2016   K 4.1 07/03/2016   CL 102 07/03/2016   CREATININE 1.16 07/03/2016   BUN 17 07/03/2016   CO2 30 07/03/2016   TSH 1.01 07/03/2016   PSA 0.23 07/03/2016   HGBA1C 5.3 07/03/2016    Mr Lumbar Spine Wo Contrast  Result Date: 02/01/2014 CLINICAL DATA:  Low back pain which waxes and wanes. EXAM: MRI LUMBAR SPINE WITHOUT CONTRAST TECHNIQUE: Multiplanar, multisequence MR imaging of the lumbar spine was performed. No intravenous contrast was administered. COMPARISON:  04/10/2013 plain films. FINDINGS: The scan extends from T11-12 through mid sacrum. Moderate loss of disc height and signal at L4-5. Endplate reactive changes at the L4-5 level indicate chronic disc space narrowing, similar to that seen at T12-L1 where there is an anterior protrusion. No worrisome osseous  lesions. Normal conus. Paravertebral soft tissues unremarkable. The individual disc spaces were examined with axial images as follows: L1-L2:  Normal. L2-L3:  Normal. L3-L4:  Normal disc space.  Mild facet arthropathy.  No impingement. L4-L5: Central disc osteophyte complex narrows the spinal canal and RIGHT greater than LEFT subarticular zones. Mild facet and ligamentum flavum hypertrophy is superimposed. No definite L5 nerve root impingement in the canal. There is BILATERAL foraminal narrowing which could affect either L4 nerve root. L5-S1:  Mild bulge.  Mild facet arthropathy.  No impingement. IMPRESSION: Multilevel spondylosis as described. Disc space narrowing at L4-5 with BILATERAL foraminal narrowing. Correlate clinically for RIGHT or LEFT L4 nerve root symptomatology. Electronically Signed   By: Davonna Belling M.D.   On: 02/01/2014 09:17    Assessment & Plan:   Anyelo was seen today for annual exam, hypertension and back pain.  Diagnoses and all orders for this visit:  Eczema, allergic -     fluocinonide-emollient (LIDEX-E) 0.05 % cream; Apply 1 application topically 2 (two) times daily.  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  Chronic low back pain without sciatica, unspecified back pain laterality -     Discontinue: oxyCODONE-acetaminophen (PERCOCET) 7.5-325 MG tablet; Take 1 tablet by mouth every 8 (eight) hours as needed. -     Discontinue: oxyCODONE-acetaminophen (PERCOCET) 7.5-325 MG tablet; Take 1 tablet by mouth every 8 (eight) hours as needed. -     Discontinue: oxyCODONE-acetaminophen (PERCOCET) 7.5-325 MG tablet; Take 1 tablet by mouth every 8 (eight) hours as needed. -      oxyCODONE-acetaminophen (PERCOCET) 7.5-325 MG tablet; Take 1 tablet by mouth every 8 (eight) hours as needed.  DDD (degenerative disc disease), lumbar -     Discontinue: oxyCODONE-acetaminophen (PERCOCET) 7.5-325 MG tablet; Take 1 tablet by mouth every 8 (eight) hours as needed. -     Discontinue: oxyCODONE-acetaminophen (PERCOCET) 7.5-325 MG tablet; Take 1 tablet by mouth every 8 (eight) hours as needed. -     Discontinue: oxyCODONE-acetaminophen (PERCOCET) 7.5-325 MG tablet; Take 1 tablet by mouth every 8 (eight) hours as needed. -     oxyCODONE-acetaminophen (PERCOCET) 7.5-325 MG tablet; Take 1 tablet by mouth every 8 (eight) hours as needed.  Routine general medical examination at a health care facility- exam completed, labs ordered and reviewed, vaccines reviewed and updated, patient education material was given. -     Lipid panel; Future -     Comprehensive metabolic panel; Future -     CBC with Differential/Platelet; Future -     TSH; Future -     Urinalysis, Routine w reflex microscopic; Future -     PSA; Future  Spinal stenosis of lumbar region without neurogenic claudication -     Discontinue: oxyCODONE-acetaminophen (PERCOCET) 7.5-325 MG tablet; Take 1 tablet by mouth every 8 (eight) hours as needed. -     Discontinue: oxyCODONE-acetaminophen (PERCOCET) 7.5-325 MG tablet; Take 1 tablet by mouth every 8 (eight) hours as needed. -     Discontinue: oxyCODONE-acetaminophen (PERCOCET) 7.5-325 MG tablet; Take 1 tablet by mouth every 8 (eight) hours as needed. -     oxyCODONE-acetaminophen (PERCOCET) 7.5-325 MG tablet; Take 1 tablet by mouth every 8 (eight) hours as needed.  Blood glucose elevated- his A1c is down to 5.3%, improvement noted with lifestyle modifications. -     Hemoglobin A1c; Future   I have discontinued Brandon Lane's indomethacin. I am also having him maintain his cetirizine, mometasone, venlafaxine XR, olmesartan, Colchicine, febuxostat, fluocinonide-emollient, and  oxyCODONE-acetaminophen.  Meds ordered this encounter  Medications  . fluocinonide-emollient (LIDEX-E) 0.05 % cream    Sig: Apply 1 application topically 2 (two) times daily.    Dispense:  60 g    Refill:  2  . DISCONTD: oxyCODONE-acetaminophen (PERCOCET) 7.5-325 MG tablet    Sig: Take 1 tablet by mouth every 8 (eight) hours as needed.    Dispense:  75 tablet    Refill:  0    FILL ON OR AFTER 07/03/16  . DISCONTD: oxyCODONE-acetaminophen (PERCOCET) 7.5-325 MG tablet    Sig: Take 1 tablet by mouth every 8 (eight) hours as needed.    Dispense:  75 tablet    Refill:  0    FILL ON OR AFTER 02/29/18  . DISCONTD: oxyCODONE-acetaminophen (PERCOCET) 7.5-325 MG tablet    Sig: Take 1 tablet by mouth every 8 (eight) hours  as needed.    Dispense:  75 tablet    Refill:  0    FILL ON OR AFTER 08/31/16  . oxyCODONE-acetaminophen (PERCOCET) 7.5-325 MG tablet    Sig: Take 1 tablet by mouth every 8 (eight) hours as needed.    Dispense:  75 tablet    Refill:  0    FILL ON OR AFTER 10/01/16     Follow-up: Return in about 4 months (around 10/31/2016).  Sanda Linger, MD

## 2016-07-03 NOTE — Patient Instructions (Signed)

## 2016-07-04 ENCOUNTER — Other Ambulatory Visit: Payer: Self-pay | Admitting: Internal Medicine

## 2016-07-04 DIAGNOSIS — R825 Elevated urine levels of drugs, medicaments and biological substances: Secondary | ICD-10-CM | POA: Insufficient documentation

## 2016-07-05 ENCOUNTER — Encounter: Payer: Self-pay | Admitting: Internal Medicine

## 2016-07-14 ENCOUNTER — Ambulatory Visit: Payer: BLUE CROSS/BLUE SHIELD | Admitting: Adult Health

## 2016-07-21 ENCOUNTER — Other Ambulatory Visit: Payer: Self-pay | Admitting: Internal Medicine

## 2016-07-31 ENCOUNTER — Ambulatory Visit: Payer: BLUE CROSS/BLUE SHIELD | Admitting: Internal Medicine

## 2016-09-21 ENCOUNTER — Ambulatory Visit: Payer: BLUE CROSS/BLUE SHIELD | Admitting: Internal Medicine

## 2016-09-21 ENCOUNTER — Ambulatory Visit (INDEPENDENT_AMBULATORY_CARE_PROVIDER_SITE_OTHER): Payer: BLUE CROSS/BLUE SHIELD | Admitting: Internal Medicine

## 2016-09-21 ENCOUNTER — Encounter: Payer: Self-pay | Admitting: Internal Medicine

## 2016-09-21 DIAGNOSIS — M545 Low back pain, unspecified: Secondary | ICD-10-CM

## 2016-09-21 DIAGNOSIS — M5136 Other intervertebral disc degeneration, lumbar region: Secondary | ICD-10-CM

## 2016-09-21 DIAGNOSIS — M48061 Spinal stenosis, lumbar region without neurogenic claudication: Secondary | ICD-10-CM | POA: Diagnosis not present

## 2016-09-21 DIAGNOSIS — G8929 Other chronic pain: Secondary | ICD-10-CM

## 2016-09-21 MED ORDER — OXYCODONE-ACETAMINOPHEN 7.5-325 MG PO TABS: 1.0000 | ORAL_TABLET | Freq: Three times a day (TID) | ORAL | 0 refills | Status: DC | PRN

## 2016-09-21 NOTE — Patient Instructions (Signed)

## 2016-09-21 NOTE — Progress Notes (Signed)
Pre visit review using our clinic review tool, if applicable. No additional management support is needed unless otherwise documented below in the visit note. 

## 2016-09-21 NOTE — Progress Notes (Signed)
Subjective:  Patient ID: Brandon Lane, male    DOB: Nov 10, 1965  Age: 51 y.o. MRN: [id number]  CC: Hypertension and Back Pain   HPI [name] presents for Follow-up on back pain. He is getting symptom relief with the occasional dose of Percocet. He complains of pain when he stands for long periods of time and says if he doesn't take Percocet it interferes with his ability to work. He said the pain is no more severe than before and there is no radiation into his lower extremities and no paresthesias.  Outpatient Medications Prior to Visit  Medication Sig Dispense Refill  . cetirizine (ZYRTEC) 10 MG tablet Take 1 tablet (10 mg total) by mouth daily. 30 tablet 11  . Colchicine (MITIGARE) 0.6 MG CAPS Take 1 capsule by mouth 2 (two) times daily. 60 capsule 3  . febuxostat (ULORIC) 40 MG tablet Take 1 tablet (40 mg total) by mouth daily. 90 tablet 3  . fluocinonide-emollient (LIDEX-E) 0.05 % cream Apply 1 application topically 2 (two) times daily. 60 g 2  . indomethacin (INDOCIN) 50 MG capsule TAKE 1 CAPSULE THREE TIMES DAILY WITH MEALS. 90 capsule 2  . mometasone (NASONEX) 50 MCG/ACT nasal spray Place 4 sprays into the nose daily. 17 g 12  . olmesartan (BENICAR) 40 MG tablet Take 1 tablet (40 mg total) by mouth daily. 90 tablet 3  . venlafaxine XR (EFFEXOR XR) 150 MG 24 hr capsule Take 1 capsule (150 mg total) by mouth daily with breakfast. 90 capsule 1  . oxyCODONE-acetaminophen (PERCOCET) 7.5-325 MG tablet Take 1 tablet by mouth every 8 (eight) hours as needed. 75 tablet 0   No facility-administered medications prior to visit.     ROS Review of Systems  Constitutional: Negative.  Negative for activity change, diaphoresis, fatigue and unexpected weight change.  HENT: Negative.   Eyes: Negative for visual disturbance.  Respiratory: Negative for cough, chest tightness, shortness of breath and wheezing.   Gastrointestinal: Negative for abdominal pain, constipation, diarrhea, nausea and  vomiting.  Endocrine: Negative.   Genitourinary: Negative.  Negative for difficulty urinating.  Musculoskeletal: Positive for back pain. Negative for myalgias and neck pain.  Skin: Negative.  Negative for color change and rash.  Allergic/Immunologic: Negative.   Neurological: Negative.  Negative for dizziness, weakness, light-headedness and numbness.  Hematological: Negative for adenopathy. Does not bruise/bleed easily.  Psychiatric/Behavioral: Negative.   All other systems reviewed and are negative.   Objective:  BP 140/80 (BP Location: Left Arm, Patient Position: Sitting, Cuff Size: Normal)   Pulse 66   Temp 97.5 F (36.4 C) (Oral)   Resp 16   Ht  (1.753 m)   Wt 236 lb (107 kg)   SpO2 99%   BMI 34.85 kg/m   BP Readings from Last 3 Encounters:  09/21/16 140/80  07/03/16 128/78  05/24/16 130/80    Wt Readings from Last 3 Encounters:  09/21/16 236 lb (107 kg)  07/03/16 235 lb (106.6 kg)  05/24/16 242 lb (109.8 kg)    Physical Exam  Constitutional: He is oriented to person, place, and time. No distress.  HENT:  Mouth/Throat: Oropharynx is clear and moist. No oropharyngeal exudate.  Eyes: Conjunctivae are normal. Right eye exhibits no discharge. Left eye exhibits no discharge. No scleral icterus.  Neck: Normal range of motion. Neck supple. No JVD present. No tracheal deviation present. No thyromegaly present.  Cardiovascular: Normal rate, regular rhythm, normal heart sounds and intact distal pulses.  Exam reveals no gallop and  no friction rub.   No murmur heard. Pulmonary/Chest: Effort normal and breath sounds normal. No stridor. No respiratory distress. He has no wheezes. He has no rales. He exhibits no tenderness.  Abdominal: Soft. Bowel sounds are normal. He exhibits no distension and no mass. There is no tenderness. There is no rebound and no guarding.  Musculoskeletal: Normal range of motion. He exhibits no edema, tenderness or deformity.  Lymphadenopathy:    He  has no cervical adenopathy.  Neurological: He is oriented to person, place, and time. He has normal strength. He displays no atrophy, no tremor and normal reflexes. No cranial nerve deficit or sensory deficit. He exhibits normal muscle tone. He displays no seizure activity. Coordination and gait normal. He displays no Babinski's sign on the right side. He displays no Babinski's sign on the left side.  Neg SLR in BLE  Skin: Skin is warm and dry. No rash noted. He is not diaphoretic. No erythema. No pallor.  Vitals reviewed.   Lab Results  Component Value Date   WBC 7.7 07/03/2016   HGB 14.3 07/03/2016   HCT 41.7 07/03/2016   PLT 221.0 07/03/2016   GLUCOSE 94 07/03/2016   CHOL 151 07/03/2016   TRIG 216.0 (H) 07/03/2016   HDL 36.20 (L) 07/03/2016   LDLDIRECT 75.0 07/03/2016   LDLCALC 100 (H) 06/03/2012   ALT 20 07/03/2016   AST 15 07/03/2016   NA 139 07/03/2016   K 4.1 07/03/2016   CL 102 07/03/2016   CREATININE 1.16 07/03/2016   BUN 17 07/03/2016   CO2 30 07/03/2016   TSH 1.01 07/03/2016   PSA 0.23 07/03/2016   HGBA1C 5.3 07/03/2016    Mr Lumbar Spine Wo Contrast  Result Date: 02/01/2014 CLINICAL DATA:  Low back pain which waxes and wanes. EXAM: MRI LUMBAR SPINE WITHOUT CONTRAST TECHNIQUE: Multiplanar, multisequence MR imaging of the lumbar spine was performed. No intravenous contrast was administered. COMPARISON:  04/10/2013 plain films. FINDINGS: The scan extends from T11-12 through mid sacrum. Moderate loss of disc height and signal at L4-5. Endplate reactive changes at the L4-5 level indicate chronic disc space narrowing, similar to that seen at T12-L1 where there is an anterior protrusion. No worrisome osseous lesions. Normal conus. Paravertebral soft tissues unremarkable. The individual disc spaces were examined with axial images as follows: L1-L2:  Normal. L2-L3:  Normal. L3-L4:  Normal disc space.  Mild facet arthropathy.  No impingement. L4-L5: Central disc osteophyte complex  narrows the spinal canal and RIGHT greater than LEFT subarticular zones. Mild facet and ligamentum flavum hypertrophy is superimposed. No definite L5 nerve root impingement in the canal. There is BILATERAL foraminal narrowing which could affect either L4 nerve root. L5-S1:  Mild bulge.  Mild facet arthropathy.  No impingement. IMPRESSION: Multilevel spondylosis as described. Disc space narrowing at L4-5 with BILATERAL foraminal narrowing. Correlate clinically for RIGHT or LEFT L4 nerve root symptomatology. Electronically Signed   By: Davonna Belling M.D.   On: 02/01/2014 09:17    Assessment & Plan:   Mabry was seen today for hypertension and back pain.  Diagnoses and all orders for this visit:  DDD (degenerative disc disease), lumbar -     Discontinue: oxyCODONE-acetaminophen (PERCOCET) 7.5-325 MG tablet; Take 1 tablet by mouth every 8 (eight) hours as needed. -     Discontinue: oxyCODONE-acetaminophen (PERCOCET) 7.5-325 MG tablet; Take 1 tablet by mouth every 8 (eight) hours as needed. -     Discontinue: oxyCODONE-acetaminophen (PERCOCET) 7.5-325 MG tablet; Take 1 tablet by  mouth every 8 (eight) hours as needed. -     oxyCODONE-acetaminophen (PERCOCET) 7.5-325 MG tablet; Take 1 tablet by mouth every 8 (eight) hours as needed.  Chronic low back pain without sciatica, unspecified back pain laterality -     Discontinue: oxyCODONE-acetaminophen (PERCOCET) 7.5-325 MG tablet; Take 1 tablet by mouth every 8 (eight) hours as needed. -     Discontinue: oxyCODONE-acetaminophen (PERCOCET) 7.5-325 MG tablet; Take 1 tablet by mouth every 8 (eight) hours as needed. -     Discontinue: oxyCODONE-acetaminophen (PERCOCET) 7.5-325 MG tablet; Take 1 tablet by mouth every 8 (eight) hours as needed. -     oxyCODONE-acetaminophen (PERCOCET) 7.5-325 MG tablet; Take 1 tablet by mouth every 8 (eight) hours as needed.  Spinal stenosis of lumbar region without neurogenic claudication -     Discontinue:  oxyCODONE-acetaminophen (PERCOCET) 7.5-325 MG tablet; Take 1 tablet by mouth every 8 (eight) hours as needed. -     Discontinue: oxyCODONE-acetaminophen (PERCOCET) 7.5-325 MG tablet; Take 1 tablet by mouth every 8 (eight) hours as needed. -     Discontinue: oxyCODONE-acetaminophen (PERCOCET) 7.5-325 MG tablet; Take 1 tablet by mouth every 8 (eight) hours as needed. -     oxyCODONE-acetaminophen (PERCOCET) 7.5-325 MG tablet; Take 1 tablet by mouth every 8 (eight) hours as needed.   I am having Mr. Collingsworth maintain his cetirizine, mometasone, venlafaxine XR, olmesartan, Colchicine, febuxostat, fluocinonide-emollient, indomethacin, and oxyCODONE-acetaminophen.  Meds ordered this encounter  Medications  . DISCONTD: oxyCODONE-acetaminophen (PERCOCET) 7.5-325 MG tablet    Sig: Take 1 tablet by mouth every 8 (eight) hours as needed.    Dispense:  75 tablet    Refill:  0    FILL ON OR AFTER 10/01/16  . DISCONTD: oxyCODONE-acetaminophen (PERCOCET) 7.5-325 MG tablet    Sig: Take 1 tablet by mouth every 8 (eight) hours as needed.    Dispense:  75 tablet    Refill:  0    FILL ON OR AFTER 10/31/16  . DISCONTD: oxyCODONE-acetaminophen (PERCOCET) 7.5-325 MG tablet    Sig: Take 1 tablet by mouth every 8 (eight) hours as needed.    Dispense:  75 tablet    Refill:  0    FILL ON OR AFTER 12/01/16  . oxyCODONE-acetaminophen (PERCOCET) 7.5-325 MG tablet    Sig: Take 1 tablet by mouth every 8 (eight) hours as needed.    Dispense:  75 tablet    Refill:  0    FILL ON OR AFTER 12/31/16     Follow-up: Return in about 4 months (around 01/21/2017).  Sanda Linger, MD

## 2016-11-25 ENCOUNTER — Other Ambulatory Visit: Payer: Self-pay | Admitting: Internal Medicine

## 2016-11-25 DIAGNOSIS — F3342 Major depressive disorder, recurrent, in full remission: Secondary | ICD-10-CM

## 2016-12-21 ENCOUNTER — Encounter: Payer: Self-pay | Admitting: Internal Medicine

## 2016-12-21 ENCOUNTER — Ambulatory Visit (INDEPENDENT_AMBULATORY_CARE_PROVIDER_SITE_OTHER): Payer: BLUE CROSS/BLUE SHIELD | Admitting: Internal Medicine

## 2016-12-21 VITALS — BP 126/68 | HR 60 | Temp 98.1°F | Ht 69.0 in | Wt 244.0 lb

## 2016-12-21 DIAGNOSIS — M5136 Other intervertebral disc degeneration, lumbar region: Secondary | ICD-10-CM | POA: Diagnosis not present

## 2016-12-21 DIAGNOSIS — M545 Low back pain, unspecified: Secondary | ICD-10-CM

## 2016-12-21 DIAGNOSIS — L989 Disorder of the skin and subcutaneous tissue, unspecified: Secondary | ICD-10-CM

## 2016-12-21 DIAGNOSIS — M48061 Spinal stenosis, lumbar region without neurogenic claudication: Secondary | ICD-10-CM

## 2016-12-21 DIAGNOSIS — G8929 Other chronic pain: Secondary | ICD-10-CM | POA: Diagnosis not present

## 2016-12-21 DIAGNOSIS — Z79891 Long term (current) use of opiate analgesic: Secondary | ICD-10-CM | POA: Diagnosis not present

## 2016-12-21 MED ORDER — OXYCODONE-ACETAMINOPHEN 7.5-325 MG PO TABS: 1.0000 | ORAL_TABLET | Freq: Three times a day (TID) | ORAL | 0 refills | Status: DC | PRN

## 2016-12-21 NOTE — Patient Instructions (Signed)

## 2016-12-21 NOTE — Progress Notes (Signed)
Subjective:  Patient ID: Brandon Lane, male    DOB: 30-Oct-1965  Age: 51 y.o. MRN: [id number]  CC: Back Pain   HPI Brandon DresserMartin Lane presents for concerns about a non-healing lesion on his right upper/top of scalp. This hs been present for about 5 months - it occasionally bleeds and drains clear fluid.  Outpatient Medications Prior to Visit  Medication Sig Dispense Refill  . cetirizine (ZYRTEC) 10 MG tablet Take 1 tablet (10 mg total) by mouth daily. 30 tablet 11  . Colchicine (MITIGARE) 0.6 MG CAPS Take 1 capsule by mouth 2 (two) times daily. 60 capsule 3  . febuxostat (ULORIC) 40 MG tablet Take 1 tablet (40 mg total) by mouth daily. 90 tablet 3  . fluocinonide-emollient (LIDEX-E) 0.05 % cream Apply 1 application topically 2 (two) times daily. 60 g 2  . indomethacin (INDOCIN) 50 MG capsule TAKE 1 CAPSULE THREE TIMES DAILY WITH MEALS. 90 capsule 2  . mometasone (NASONEX) 50 MCG/ACT nasal spray Place 4 sprays into the nose daily. 17 g 12  . olmesartan (BENICAR) 40 MG tablet Take 1 tablet (40 mg total) by mouth daily. 90 tablet 3  . venlafaxine XR (EFFEXOR-XR) 150 MG 24 hr capsule TAKE 1 CAPSULE BY MOUTH DAILY WITH BREAKFAST. 90 capsule 1  . oxyCODONE-acetaminophen (PERCOCET) 7.5-325 MG tablet Take 1 tablet by mouth every 8 (eight) hours as needed. 75 tablet 0   No facility-administered medications prior to visit.     ROS Review of Systems  Constitutional: Negative.   HENT: Negative.   Eyes: Negative.   Respiratory: Negative.  Negative for cough, chest tightness, shortness of breath and wheezing.   Cardiovascular: Negative for chest pain, palpitations and leg swelling.  Gastrointestinal: Negative for abdominal pain, constipation, diarrhea, nausea and vomiting.  Endocrine: Negative.   Genitourinary: Negative.  Negative for difficulty urinating.  Musculoskeletal: Positive for back pain. Negative for myalgias and neck pain.  Skin: Negative.   Allergic/Immunologic: Negative.     Neurological: Negative.   Hematological: Negative for adenopathy. Does not bruise/bleed easily.  Psychiatric/Behavioral: Negative.     Objective:  BP 126/68 (BP Location: Left Arm, Patient Position: Sitting, Cuff Size: Large)   Pulse 60   Temp 98.1 F (36.7 C) (Oral)   Ht 5\' 9"  (1.753 m)   Wt 244 lb (110.7 kg)   SpO2 99%   BMI 36.03 kg/m   BP Readings from Last 3 Encounters:  12/21/16 126/68  09/21/16 140/80  07/03/16 128/78    Wt Readings from Last 3 Encounters:  12/21/16 244 lb (110.7 kg)  09/21/16 236 lb (107 kg)  07/03/16 235 lb (106.6 kg)    Physical Exam  Constitutional: He is oriented to person, place, and time. No distress.  HENT:  Head:    Eyes: Conjunctivae are normal. Right eye exhibits no discharge. Left eye exhibits no discharge. No scleral icterus.  Neck: Normal range of motion. Neck supple. No JVD present. No thyromegaly present.  Cardiovascular: Normal rate, regular rhythm and intact distal pulses.  Exam reveals no gallop and no friction rub.   No murmur heard. Pulmonary/Chest: Effort normal and breath sounds normal. No respiratory distress. He has no wheezes. He has no rales. He exhibits no tenderness.  Abdominal: Soft. Bowel sounds are normal. He exhibits no distension and no mass. There is no tenderness. There is no rebound and no guarding.  Musculoskeletal: Normal range of motion. He exhibits no edema or tenderness.  Lymphadenopathy:    He has no cervical adenopathy.  Neurological: He is alert and oriented to person, place, and time.  Neg SLR in BLE  Skin: Skin is warm and dry. No rash noted. He is not diaphoretic. No erythema. No pallor.  Vitals reviewed.   Lab Results  Component Value Date   WBC 7.7 07/03/2016   HGB 14.3 07/03/2016   HCT 41.7 07/03/2016   PLT 221.0 07/03/2016   GLUCOSE 94 07/03/2016   CHOL 151 07/03/2016   TRIG 216.0 (H) 07/03/2016   HDL 36.20 (L) 07/03/2016   LDLDIRECT 75.0 07/03/2016   LDLCALC 100 (H) 06/03/2012    ALT 20 07/03/2016   AST 15 07/03/2016   NA 139 07/03/2016   K 4.1 07/03/2016   CL 102 07/03/2016   CREATININE 1.16 07/03/2016   BUN 17 07/03/2016   CO2 30 07/03/2016   TSH 1.01 07/03/2016   PSA 0.23 07/03/2016   HGBA1C 5.3 07/03/2016    Mr Lumbar Spine Wo Contrast  Result Date: 02/01/2014 CLINICAL DATA:  Low back pain which waxes and wanes. EXAM: MRI LUMBAR SPINE WITHOUT CONTRAST TECHNIQUE: Multiplanar, multisequence MR imaging of the lumbar spine was performed. No intravenous contrast was administered. COMPARISON:  04/10/2013 plain films. FINDINGS: The scan extends from T11-12 through mid sacrum. Moderate loss of disc height and signal at L4-5. Endplate reactive changes at the L4-5 level indicate chronic disc space narrowing, similar to that seen at T12-L1 where there is an anterior protrusion. No worrisome osseous lesions. Normal conus. Paravertebral soft tissues unremarkable. The individual disc spaces were examined with axial images as follows: L1-L2:  Normal. L2-L3:  Normal. L3-L4:  Normal disc space.  Mild facet arthropathy.  No impingement. L4-L5: Central disc osteophyte complex narrows the spinal canal and RIGHT greater than LEFT subarticular zones. Mild facet and ligamentum flavum hypertrophy is superimposed. No definite L5 nerve root impingement in the canal. There is BILATERAL foraminal narrowing which could affect either L4 nerve root. L5-S1:  Mild bulge.  Mild facet arthropathy.  No impingement. IMPRESSION: Multilevel spondylosis as described. Disc space narrowing at L4-5 with BILATERAL foraminal narrowing. Correlate clinically for RIGHT or LEFT L4 nerve root symptomatology. Electronically Signed   By: Davonna Belling M.D.   On: 02/01/2014 09:17    Assessment & Plan:   Athel was seen today for back pain.  Diagnoses and all orders for this visit:  Skin lesion of scalp -     Ambulatory referral to Dermatology  DDD (degenerative disc disease), lumbar -     oxyCODONE-acetaminophen  (PERCOCET) 7.5-325 MG tablet; Take 1 tablet by mouth every 8 (eight) hours as needed.  Chronic low back pain without sciatica, unspecified back pain laterality -     oxyCODONE-acetaminophen (PERCOCET) 7.5-325 MG tablet; Take 1 tablet by mouth every 8 (eight) hours as needed.  Spinal stenosis of lumbar region without neurogenic claudication- He is getting adequate symptom relief with oxycodone. Urine drug screen collected today to confirm compliance and to screen for substance abuse. -     oxyCODONE-acetaminophen (PERCOCET) 7.5-325 MG tablet; Take 1 tablet by mouth every 8 (eight) hours as needed.   I am having Mr. Hovanec maintain his cetirizine, mometasone, olmesartan, Colchicine, febuxostat, fluocinonide-emollient, indomethacin, venlafaxine XR, and oxyCODONE-acetaminophen.  Meds ordered this encounter  Medications  . oxyCODONE-acetaminophen (PERCOCET) 7.5-325 MG tablet    Sig: Take 1 tablet by mouth every 8 (eight) hours as needed.    Dispense:  75 tablet    Refill:  0    FILL ON OR AFTER 01/31/17  Follow-up: Return in about 4 months (around 04/23/2017).  Sanda Linger, MD

## 2016-12-27 ENCOUNTER — Other Ambulatory Visit: Payer: Self-pay | Admitting: Internal Medicine

## 2016-12-27 ENCOUNTER — Telehealth: Payer: Self-pay

## 2016-12-27 NOTE — Telephone Encounter (Signed)
Toxicology test came back negative for rx'ed oxycodone.   Per PCP note: "UDS is negative for oxycodone, percocet prescription has been dc'ed and will not be restarted".  LVM for pt to call back as soon as possible.

## 2016-12-28 NOTE — Telephone Encounter (Signed)
LVM for pt to call back as soon as possible.   

## 2017-01-01 NOTE — Telephone Encounter (Signed)
Pt informed that Oxy has been dc'ed.

## 2017-01-01 NOTE — Telephone Encounter (Signed)
Pt called back asking for his test results, please call back, states he will be in court this morning and said you could leave a message if you like.

## 2017-01-10 DIAGNOSIS — D1801 Hemangioma of skin and subcutaneous tissue: Secondary | ICD-10-CM | POA: Diagnosis not present

## 2017-01-17 ENCOUNTER — Encounter: Payer: Self-pay | Admitting: Internal Medicine

## 2017-01-17 ENCOUNTER — Ambulatory Visit (INDEPENDENT_AMBULATORY_CARE_PROVIDER_SITE_OTHER): Payer: BLUE CROSS/BLUE SHIELD | Admitting: Internal Medicine

## 2017-01-17 VITALS — BP 122/80 | HR 55 | Temp 98.8°F | Resp 16 | Ht 69.0 in | Wt 227.1 lb

## 2017-01-17 DIAGNOSIS — M109 Gout, unspecified: Secondary | ICD-10-CM | POA: Insufficient documentation

## 2017-01-17 MED ORDER — COLCHICINE 0.6 MG PO CAPS: 1.0000 | ORAL_CAPSULE | Freq: Two times a day (BID) | ORAL | 3 refills | Status: DC

## 2017-01-17 MED ORDER — INDOMETHACIN 50 MG PO CAPS: 50.0000 mg | ORAL_CAPSULE | Freq: Three times a day (TID) | ORAL | 1 refills | Status: DC | PRN

## 2017-01-17 MED ORDER — METHYLPREDNISOLONE 4 MG PO TBPK: ORAL_TABLET | ORAL | 0 refills | Status: DC

## 2017-01-17 NOTE — Patient Instructions (Signed)

## 2017-01-17 NOTE — Progress Notes (Signed)
Subjective:  Patient ID: Brandon Lane, male    DOB: 06/14/1965  Age: 51 y.o. MRN: [id number]  CC: Gout   HPI Brandon Lane presents for a 2 day hx of non-traumatic pain, redness swelling in his right foot at the base of the toe. He has not been taking mitigare because his insurance would not cover it. He has not taken anything for the pain.  Outpatient Medications Prior to Visit  Medication Sig Dispense Refill  . cetirizine (ZYRTEC) 10 MG tablet Take 1 tablet (10 mg total) by mouth daily. 30 tablet 11  . febuxostat (ULORIC) 40 MG tablet Take 1 tablet (40 mg total) by mouth daily. 90 tablet 3  . fluocinonide-emollient (LIDEX-E) 0.05 % cream Apply 1 application topically 2 (two) times daily. 60 g 2  . mometasone (NASONEX) 50 MCG/ACT nasal spray Place 4 sprays into the nose daily. 17 g 12  . olmesartan (BENICAR) 40 MG tablet Take 1 tablet (40 mg total) by mouth daily. 90 tablet 3  . venlafaxine XR (EFFEXOR-XR) 150 MG 24 hr capsule TAKE 1 CAPSULE BY MOUTH DAILY WITH BREAKFAST. 90 capsule 1  . Colchicine (MITIGARE) 0.6 MG CAPS Take 1 capsule by mouth 2 (two) times daily. 60 capsule 3  . indomethacin (INDOCIN) 50 MG capsule TAKE 1 CAPSULE THREE TIMES DAILY WITH MEALS. 90 capsule 2   No facility-administered medications prior to visit.     ROS Review of Systems  Constitutional: Negative for chills, fatigue and fever.  HENT: Negative.   Eyes: Negative.   Respiratory: Negative.   Cardiovascular: Negative.   Gastrointestinal: Negative.  Negative for abdominal pain.  Endocrine: Negative.   Genitourinary: Negative.   Musculoskeletal: Positive for arthralgias. Negative for back pain.  Skin: Positive for color change. Negative for rash.  Allergic/Immunologic: Negative.   Neurological: Negative.   Hematological: Negative.   Psychiatric/Behavioral: Negative.     Objective:  BP 122/80 (BP Location: Left Arm, Patient Position: Sitting, Cuff Size: Normal)   Pulse (!) 55   Temp 98.8 F  (37.1 C) (Oral)   Resp 16   Ht 5\' 9"  (1.753 m)   Wt 227 lb 1 oz (103 kg)   SpO2 99%   BMI 33.53 kg/m   BP Readings from Last 3 Encounters:  01/17/17 122/80  12/21/16 126/68  09/21/16 140/80    Wt Readings from Last 3 Encounters:  01/17/17 227 lb 1 oz (103 kg)  12/21/16 244 lb (110.7 kg)  09/21/16 236 lb (107 kg)    Physical Exam  Musculoskeletal:       Feet:    Lab Results  Component Value Date   WBC 7.7 07/03/2016   HGB 14.3 07/03/2016   HCT 41.7 07/03/2016   PLT 221.0 07/03/2016   GLUCOSE 94 07/03/2016   CHOL 151 07/03/2016   TRIG 216.0 (H) 07/03/2016   HDL 36.20 (L) 07/03/2016   LDLDIRECT 75.0 07/03/2016   LDLCALC 100 (H) 06/03/2012   ALT 20 07/03/2016   AST 15 07/03/2016   NA 139 07/03/2016   K 4.1 07/03/2016   CL 102 07/03/2016   CREATININE 1.16 07/03/2016   BUN 17 07/03/2016   CO2 30 07/03/2016   TSH 1.01 07/03/2016   PSA 0.23 07/03/2016   HGBA1C 5.3 07/03/2016    Mr Lumbar Spine Wo Contrast  Result Date: 02/01/2014 CLINICAL DATA:  Low back pain which waxes and wanes. EXAM: MRI LUMBAR SPINE WITHOUT CONTRAST TECHNIQUE: Multiplanar, multisequence MR imaging of the lumbar spine was performed. No intravenous contrast  was administered. COMPARISON:  04/10/2013 plain films. FINDINGS: The scan extends from T11-12 through mid sacrum. Moderate loss of disc height and signal at L4-5. Endplate reactive changes at the L4-5 level indicate chronic disc space narrowing, similar to that seen at T12-L1 where there is an anterior protrusion. No worrisome osseous lesions. Normal conus. Paravertebral soft tissues unremarkable. The individual disc spaces were examined with axial images as follows: L1-L2:  Normal. L2-L3:  Normal. L3-L4:  Normal disc space.  Mild facet arthropathy.  No impingement. L4-L5: Central disc osteophyte complex narrows the spinal canal and RIGHT greater than LEFT subarticular zones. Mild facet and ligamentum flavum hypertrophy is superimposed. No definite  L5 nerve root impingement in the canal. There is BILATERAL foraminal narrowing which could affect either L4 nerve root. L5-S1:  Mild bulge.  Mild facet arthropathy.  No impingement. IMPRESSION: Multilevel spondylosis as described. Disc space narrowing at L4-5 with BILATERAL foraminal narrowing. Correlate clinically for RIGHT or LEFT L4 nerve root symptomatology. Electronically Signed   By: Davonna BellingJohn  Curnes M.D.   On: 02/01/2014 09:17    Assessment & Plan:   Brandon Lane was seen today for gout.  Diagnoses and all orders for this visit:  Gouty arthritis of right great toe- this is a moderately severe flare so will treat with a course of methylprednisolone. Will also restart colchicine and indomethacin to reduce the pain and inflammation. -     Colchicine (MITIGARE) 0.6 MG CAPS; Take 1 capsule by mouth 2 (two) times daily. -     indomethacin (INDOCIN) 50 MG capsule; Take 1 capsule (50 mg total) by mouth 3 (three) times daily as needed. -     methylPREDNISolone (MEDROL DOSEPAK) 4 MG TBPK tablet; TAKE AS DIRECTED  Gouty arthritis of toe of right foot   I have changed Brandon Lane's indomethacin. I am also having him start on methylPREDNISolone. Additionally, I am having him maintain his cetirizine, mometasone, olmesartan, febuxostat, fluocinonide-emollient, venlafaxine XR, and Colchicine.  Meds ordered this encounter  Medications  . Colchicine (MITIGARE) 0.6 MG CAPS    Sig: Take 1 capsule by mouth 2 (two) times daily.    Dispense:  60 capsule    Refill:  3  . indomethacin (INDOCIN) 50 MG capsule    Sig: Take 1 capsule (50 mg total) by mouth 3 (three) times daily as needed.    Dispense:  90 capsule    Refill:  1  . methylPREDNISolone (MEDROL DOSEPAK) 4 MG TBPK tablet    Sig: TAKE AS DIRECTED    Dispense:  21 tablet    Refill:  0     Follow-up: Return in about 4 weeks (around 02/14/2017).  Sanda Lingerhomas Jones, MD

## 2017-01-22 ENCOUNTER — Ambulatory Visit: Payer: BLUE CROSS/BLUE SHIELD | Admitting: Internal Medicine

## 2017-02-12 ENCOUNTER — Telehealth: Payer: Self-pay | Admitting: Internal Medicine

## 2017-02-12 DIAGNOSIS — Z0279 Encounter for issue of other medical certificate: Secondary | ICD-10-CM

## 2017-02-12 NOTE — Telephone Encounter (Signed)
Forms have been completed&signed, faxed 9/10 , mailed to patient copy to send and charged for.   Patient informed.

## 2017-02-12 NOTE — Telephone Encounter (Signed)
I do not have these forms.  Called Dana with HR and requested to have the forms resent. Gave side a fax number.

## 2017-02-12 NOTE — Telephone Encounter (Signed)
Patient is calling looking for FMLA forms. I have not seen any forms. He states he came by the next day to drop them off after his appointment on 8/15. (No FYI of drop off in notes)  He states if we do not get these forms to his job, he may lose it. He asked that we call his employer and let them know what is going on.  [phone] -Malachi Paradiseana  Stefannie do you have these forms?

## 2017-02-12 NOTE — Telephone Encounter (Signed)
Form received from [place] DaleDana and filled out. Given to PCP to sign.

## 2017-03-01 ENCOUNTER — Other Ambulatory Visit: Payer: Self-pay | Admitting: Internal Medicine

## 2017-03-01 DIAGNOSIS — M109 Gout, unspecified: Secondary | ICD-10-CM

## 2017-04-17 ENCOUNTER — Encounter: Payer: Self-pay | Admitting: Internal Medicine

## 2017-04-17 ENCOUNTER — Ambulatory Visit (INDEPENDENT_AMBULATORY_CARE_PROVIDER_SITE_OTHER): Payer: BLUE CROSS/BLUE SHIELD | Admitting: Internal Medicine

## 2017-04-17 VITALS — BP 120/80 | HR 83 | Temp 98.0°F | Resp 16 | Ht 69.0 in | Wt 231.0 lb

## 2017-04-17 DIAGNOSIS — L239 Allergic contact dermatitis, unspecified cause: Secondary | ICD-10-CM

## 2017-04-17 DIAGNOSIS — F325 Major depressive disorder, single episode, in full remission: Secondary | ICD-10-CM

## 2017-04-17 DIAGNOSIS — I10 Essential (primary) hypertension: Secondary | ICD-10-CM | POA: Diagnosis not present

## 2017-04-17 DIAGNOSIS — F3342 Major depressive disorder, recurrent, in full remission: Secondary | ICD-10-CM

## 2017-04-17 MED ORDER — VENLAFAXINE HCL ER 150 MG PO CP24: ORAL_CAPSULE | ORAL | 1 refills | Status: DC

## 2017-04-17 MED ORDER — FLUOCINONIDE-E 0.05 % EX CREA: 1.0000 "application " | TOPICAL_CREAM | Freq: Two times a day (BID) | CUTANEOUS | 2 refills | Status: DC

## 2017-04-17 NOTE — Patient Instructions (Signed)
Major Depressive Disorder, Adult Major depressive disorder (MDD) is a mental health condition. It may also be called clinical depression or unipolar depression. MDD usually causes feelings of sadness, hopelessness, or helplessness. MDD can also cause physical symptoms. It can interfere with work, school, relationships, and other everyday activities. MDD may be mild, moderate, or severe. It may occur once (single episode major depressive disorder) or it may occur multiple times (recurrent major depressive disorder). What are the causes? The exact cause of this condition is not known. MDD is most likely caused by a combination of things, which may include:  Genetic factors. These are traits that are passed along from parent to child.  Individual factors. Your personality, your behavior, and the way you handle your thoughts and feelings may contribute to MDD. This includes personality traits and behaviors learned from others.  Physical factors, such as: ? Differences in the part of your brain that controls emotion. This part of your brain may be different than it is in people who do not have MDD. ? Long-term (chronic) medical or psychiatric illnesses.  Social factors. Traumatic experiences or major life changes may play a role in the development of MDD.  What increases the risk? This condition is more likely to develop in women. The following factors may also make you more likely to develop MDD:  A family history of depression.  Troubled family relationships.  Abnormally low levels of certain brain chemicals.  Traumatic events in childhood, especially abuse or the loss of a parent.  Being under a lot of stress, or long-term stress, especially from upsetting life experiences or losses.  A history of: ? Chronic physical illness. ? Other mental health disorders. ? Substance abuse.  Poor living conditions.  Experiencing social exclusion or discrimination on a regular basis.  What are  the signs or symptoms? The main symptoms of MDD typically include:  Constant depressed or irritable mood.  Loss of interest in things and activities.  MDD symptoms may also include:  Sleeping or eating too much or too little.  Unexplained weight change.  Fatigue or low energy.  Feelings of worthlessness or guilt.  Difficulty thinking clearly or making decisions.  Thoughts of suicide or of harming others.  Physical agitation or weakness.  Isolation.  Severe cases of MDD may also occur with other symptoms, such as:  Delusions or hallucinations, in which you imagine things that are not real (psychotic depression).  Low-level depression that lasts at least a year (chronic depression or persistent depressive disorder).  Extreme sadness and hopelessness (melancholic depression).  Trouble speaking and moving (catatonic depression).  How is this diagnosed? This condition may be diagnosed based on:  Your symptoms.  Your medical history, including your mental health history. This may involve tests to evaluate your mental health. You may be asked questions about your lifestyle, including any drug and alcohol use, and how long you have had symptoms of MDD.  A physical exam.  Blood tests to rule out other conditions.  You must have a depressed mood and at least four other MDD symptoms most of the day, nearly every day in the same 2-week timeframe before your health care provider can confirm a diagnosis of MDD. How is this treated? This condition is usually treated by mental health professionals, such as psychologists, psychiatrists, and clinical social workers. You may need more than one type of treatment. Treatment may include:  Psychotherapy. This is also called talk therapy or counseling. Types of psychotherapy include: ? Cognitive behavioral   therapy (CBT). This type of therapy teaches you to recognize unhealthy feelings, thoughts, and behaviors, and replace them with  positive thoughts and actions. ? Interpersonal therapy (IPT). This helps you to improve the way you relate to and communicate with others. ? Family therapy. This treatment includes members of your family.  Medicine to treat anxiety and depression, or to help you control certain emotions and behaviors.  Lifestyle changes, such as: ? Limiting alcohol and drug use. ? Exercising regularly. ? Getting plenty of sleep. ? Making healthy eating choices. ? Spending more time outdoors.  Treatments involving stimulation of the brain can be used in situations with extremely severe symptoms, or when medicine or other therapies do not work over time. These treatments include electroconvulsive therapy, transcranial magnetic stimulation, and vagal nerve stimulation. Follow these instructions at home: Activity  Return to your normal activities as told by your health care provider.  Exercise regularly and spend time outdoors as told by your health care provider. General instructions  Take over-the-counter and prescription medicines only as told by your health care provider.  Do not drink alcohol. If you drink alcohol, limit your alcohol intake to no more than 1 drink a day for nonpregnant women and 2 drinks a day for men. One drink equals 12 oz of beer, 5 oz of wine, or 1 oz of hard liquor. Alcohol can affect any antidepressant medicines you are taking. Talk to your health care provider about your alcohol use.  Eat a healthy diet and get plenty of sleep.  Find activities that you enjoy doing, and make time to do them.  Consider joining a support group. Your health care provider may be able to recommend a support group.  Keep all follow-up visits as told by your health care provider. This is important. Where to find more information: National Alliance on Mental Illness  www.nami.org  [place] National Institute of Mental Health  www.nimh.nih.gov  National Suicide Prevention  Lifeline  1-800-273-TALK (8255). This is free, 24-hour help.  Contact a health care provider if:  Your symptoms get worse.  You develop new symptoms. Get help right away if:  You self-harm.  You have serious thoughts about hurting yourself or others.  You see, hear, taste, smell, or feel things that are not present (hallucinate). This information is not intended to replace advice given to you by your health care provider. Make sure you discuss any questions you have with your health care provider. Document Released: 09/16/2012 Document Revised: 01/27/2016 Document Reviewed: 12/01/2015 Elsevier Interactive Patient Education  2017 Elsevier Inc.  

## 2017-04-17 NOTE — Progress Notes (Signed)
Subjective:  Patient ID: Brandon Lane, male    DOB: January 05, 1966  Age: 51 y.o. MRN: [id number]  CC: Hypertension and Rash   HPI Brandon Lane presents for a BP check.  He tells me his blood pressure has been well controlled.  Complains of chronic, recurrent itchy rash on the anterior aspect of both lower extremities.  He has treated this before with a topical steroid and he requests a refill.  He reports that his depressive symptoms are under good control and he wants a refill of Effexor XR at the current dose.  Outpatient Medications Prior to Visit  Medication Sig Dispense Refill  . cetirizine (ZYRTEC) 10 MG tablet Take 1 tablet (10 mg total) by mouth daily. 30 tablet 11  . Colchicine (MITIGARE) 0.6 MG CAPS Take 1 capsule by mouth 2 (two) times daily. 60 capsule 3  . febuxostat (ULORIC) 40 MG tablet Take 1 tablet (40 mg total) by mouth daily. 90 tablet 3  . indomethacin (INDOCIN) 50 MG capsule Take 1 capsule (50 mg total) by mouth 3 (three) times daily as needed. 90 capsule 1  . methylPREDNISolone (MEDROL DOSEPAK) 4 MG TBPK tablet TAKE AS DIRECTED 21 tablet 0  . mometasone (NASONEX) 50 MCG/ACT nasal spray Place 4 sprays into the nose daily. 17 g 12  . olmesartan (BENICAR) 40 MG tablet Take 1 tablet (40 mg total) by mouth daily. 90 tablet 3  . fluocinonide-emollient (LIDEX-E) 0.05 % cream Apply 1 application topically 2 (two) times daily. 60 g 2  . venlafaxine XR (EFFEXOR-XR) 150 MG 24 hr capsule TAKE 1 CAPSULE BY MOUTH DAILY WITH BREAKFAST. 90 capsule 1   No facility-administered medications prior to visit.     ROS Review of Systems  Constitutional: Negative for appetite change, diaphoresis, fatigue and unexpected weight change.  HENT: Negative.  Negative for sinus pressure.   Eyes: Negative.  Negative for visual disturbance.  Respiratory: Negative.  Negative for cough, chest tightness, shortness of breath and wheezing.   Cardiovascular: Negative.  Negative for chest pain,  palpitations and leg swelling.  Gastrointestinal: Negative.  Negative for abdominal pain, constipation, diarrhea, nausea and vomiting.  Endocrine: Negative.   Genitourinary: Negative.  Negative for difficulty urinating.  Musculoskeletal: Negative.  Negative for arthralgias, back pain, myalgias and neck pain.  Skin: Positive for rash. Negative for color change and pallor.  Allergic/Immunologic: Negative.   Neurological: Negative.  Negative for dizziness, weakness and light-headedness.  Hematological: Negative for adenopathy. Does not bruise/bleed easily.  Psychiatric/Behavioral: Negative.  Negative for behavioral problems, decreased concentration, dysphoric mood and sleep disturbance. The patient is not nervous/anxious and is not hyperactive.     Objective:  BP 120/80 (BP Location: Left Arm, Patient Position: Sitting, Cuff Size: Large)   Pulse 83   Temp 98 F (36.7 C) (Oral)   Ht 5\' 9"  (1.753 m)   Wt 231 lb (104.8 kg)   SpO2 98%   BMI 34.11 kg/m   BP Readings from Last 3 Encounters:  04/17/17 120/80  01/17/17 122/80  12/21/16 126/68    Wt Readings from Last 3 Encounters:  04/17/17 231 lb (104.8 kg)  01/17/17 227 lb 1 oz (103 kg)  12/21/16 244 lb (110.7 kg)    Physical Exam  Constitutional: He is oriented to person, place, and time. No distress.  HENT:  Mouth/Throat: Oropharynx is clear and moist. No oropharyngeal exudate.  Eyes: Conjunctivae are normal. Right eye exhibits no discharge. Left eye exhibits no discharge. No scleral icterus.  Neck: Normal range  of motion. Neck supple. No JVD present. No thyromegaly present.  Cardiovascular: Normal rate and regular rhythm.  No murmur heard. Pulmonary/Chest: Effort normal and breath sounds normal. No respiratory distress. He has no rales.  Abdominal: Soft. Bowel sounds are normal. He exhibits no distension and no mass. There is no tenderness. There is no rebound and no guarding.  Musculoskeletal: Normal range of motion. He  exhibits no edema, tenderness or deformity.  Lymphadenopathy:    He has no cervical adenopathy.  Neurological: He is alert and oriented to person, place, and time.  Skin: Skin is warm and dry. Rash noted. He is not diaphoretic. No erythema. No pallor.  There are tiny scaly papules on the anterior aspect of both lower extremities.  Some are excoriated and have developed a scab.  Some are hyperpigmented.  There are no vesicles, pustules, erythema, exudate, or erythema.  Vitals reviewed.   Lab Results  Component Value Date   WBC 7.7 07/03/2016   HGB 14.3 07/03/2016   HCT 41.7 07/03/2016   PLT 221.0 07/03/2016   GLUCOSE 94 07/03/2016   CHOL 151 07/03/2016   TRIG 216.0 (H) 07/03/2016   HDL 36.20 (L) 07/03/2016   LDLDIRECT 75.0 07/03/2016   LDLCALC 100 (H) 06/03/2012   ALT 20 07/03/2016   AST 15 07/03/2016   NA 139 07/03/2016   K 4.1 07/03/2016   CL 102 07/03/2016   CREATININE 1.16 07/03/2016   BUN 17 07/03/2016   CO2 30 07/03/2016   TSH 1.01 07/03/2016   PSA 0.23 07/03/2016   HGBA1C 5.3 07/03/2016    Mr Lumbar Spine Wo Contrast  Result Date: 02/01/2014 CLINICAL DATA:  Low back pain which waxes and wanes. EXAM: MRI LUMBAR SPINE WITHOUT CONTRAST TECHNIQUE: Multiplanar, multisequence MR imaging of the lumbar spine was performed. No intravenous contrast was administered. COMPARISON:  04/10/2013 plain films. FINDINGS: The scan extends from T11-12 through mid sacrum. Moderate loss of disc height and signal at L4-5. Endplate reactive changes at the L4-5 level indicate chronic disc space narrowing, similar to that seen at T12-L1 where there is an anterior protrusion. No worrisome osseous lesions. Normal conus. Paravertebral soft tissues unremarkable. The individual disc spaces were examined with axial images as follows: L1-L2:  Normal. L2-L3:  Normal. L3-L4:  Normal disc space.  Mild facet arthropathy.  No impingement. L4-L5: Central disc osteophyte complex narrows the spinal canal and RIGHT  greater than LEFT subarticular zones. Mild facet and ligamentum flavum hypertrophy is superimposed. No definite L5 nerve root impingement in the canal. There is BILATERAL foraminal narrowing which could affect either L4 nerve root. L5-S1:  Mild bulge.  Mild facet arthropathy.  No impingement. IMPRESSION: Multilevel spondylosis as described. Disc space narrowing at L4-5 with BILATERAL foraminal narrowing. Correlate clinically for RIGHT or LEFT L4 nerve root symptomatology. Electronically Signed   By: Davonna Belling M.D.   On: 02/01/2014 09:17    Assessment & Plan:   Keevin was seen today for hypertension and rash.  Diagnoses and all orders for this visit:  Essential hypertension, benign-his blood pressure is adequately well controlled.  I will monitor his electrolytes and renal function. -     Basic metabolic panel; Future  Eczema, allergic- Will restart a topical steroid. -     fluocinonide-emollient (LIDEX-E) 0.05 % cream; Apply 1 application 2 (two) times daily topically.  Major depressive disorder in full remission, unspecified whether recurrent (HCC)-his symptoms are well controlled.  Will continue venlafaxine at the current dose. -  venlafaxine XR (EFFEXOR-XR) 150 MG 24 hr capsule; TAKE 1 CAPSULE BY MOUTH DAILY WITH BREAKFAST.  Recurrent major depressive disorder, in full remission (HCC)   I have changed Brandon Lane's fluocinonide-emollient. I am also having him maintain his cetirizine, mometasone, olmesartan, febuxostat, Colchicine, indomethacin, methylPREDNISolone, and venlafaxine XR.  Meds ordered this encounter  Medications  . venlafaxine XR (EFFEXOR-XR) 150 MG 24 hr capsule    Sig: TAKE 1 CAPSULE BY MOUTH DAILY WITH BREAKFAST.    Dispense:  90 capsule    Refill:  1  . fluocinonide-emollient (LIDEX-E) 0.05 % cream    Sig: Apply 1 application 2 (two) times daily topically.    Dispense:  60 g    Refill:  2     Follow-up: Return in about 6 months (around  10/15/2017).  Sanda Lingerhomas Jones, MD

## 2017-04-23 ENCOUNTER — Ambulatory Visit: Payer: BLUE CROSS/BLUE SHIELD | Admitting: Internal Medicine

## 2017-06-08 ENCOUNTER — Ambulatory Visit: Payer: Self-pay

## 2017-06-08 NOTE — Telephone Encounter (Signed)
  Reason for Disposition . [1] SEVERE diarrhea (e.g., 7 or more times / day more than normal) AND [2] present > 24 hours (1 day)  Answer Assessment - Initial Assessment Questions 1. DIARRHEA SEVERITY: "How bad is the diarrhea?" "How many extra stools have you had in the past 24 hours than normal?"    - MILD: Few loose or mushy BMs; increase of 1-3 stools over normal daily number of stools; mild increase in ostomy output.   - MODERATE: Increase of 4-6 stools daily over normal; moderate increase in ostomy output.   - SEVERE (or Worst Possible): Increase of 7 or more stools daily over normal; moderate increase in ostomy output; incontinence.     7 2. ONSET: "When did the diarrhea begin?"      Saturday 3. BM CONSISTENCY: "How loose or watery is the diarrhea?"      Watery 4. VOMITING: "Are you also vomiting?" If so, ask: "How many times in the past 24 hours?"      No - nausea 5. ABDOMINAL PAIN: "Are you having any abdominal pain?" If yes: "What does it feel like?" (e.g., crampy, dull, intermittent, constant)      cRAMPS 6. ABDOMINAL PAIN SEVERITY: If present, ask: "How bad is the pain?"  (e.g., Scale 1-10; mild, moderate, or severe)    - MILD (1-3): doesn't interfere with normal activities, abdomen soft and not tender to touch     - MODERATE (4-7): interferes with normal activities or awakens from sleep, tender to touch     - SEVERE (8-10): excruciating pain, doubled over, unable to do any normal activities       3 7. ORAL INTAKE: If vomiting, "Have you been able to drink liquids?" "How much fluids have you had in the past 24 hours?"     Drinking water 8. HYDRATION: "Any signs of dehydration?" (e.g., dry mouth [not just dry lips], too weak to stand, dizziness, new weight loss) "When did you last urinate?"     Dizziness; passing urine ok 9. EXPOSURE: "Have you traveled to a foreign country recently?" "Have you been exposed to anyone with diarrhea?" "Could you have eaten any food that was  spoiled?"     No 10. OTHER SYMPTOMS: "Do you have any other symptoms?" (e.g., fever, blood in stool)       Low grade fever 11. PREGNANCY: "Is there any chance you are pregnant?" "When was your last menstrual period?"       no  Prot[facility]. Instructed to stay hydrated.

## 2017-06-09 ENCOUNTER — Encounter: Payer: Self-pay | Admitting: Family Medicine

## 2017-06-09 ENCOUNTER — Ambulatory Visit (INDEPENDENT_AMBULATORY_CARE_PROVIDER_SITE_OTHER): Payer: BLUE CROSS/BLUE SHIELD | Admitting: Family Medicine

## 2017-06-09 DIAGNOSIS — A09 Infectious gastroenteritis and colitis, unspecified: Secondary | ICD-10-CM | POA: Diagnosis not present

## 2017-06-09 DIAGNOSIS — R197 Diarrhea, unspecified: Secondary | ICD-10-CM | POA: Insufficient documentation

## 2017-06-09 DIAGNOSIS — E860 Dehydration: Secondary | ICD-10-CM | POA: Diagnosis not present

## 2017-06-09 MED ORDER — PROMETHAZINE HCL 25 MG PO TABS: 25.0000 mg | ORAL_TABLET | Freq: Two times a day (BID) | ORAL | 0 refills | Status: DC | PRN

## 2017-06-09 NOTE — Patient Instructions (Signed)
Rest,  Push fluids as this is most important.  Bland diet but return to regular foods as soon as able.  Can use phenergan as needed for nausea.  If not improving as expected.. Follow up with PCP next week.

## 2017-06-09 NOTE — Progress Notes (Signed)
   Subjective:    Patient ID: Larey DresserMartin Ciampa, male    DOB: 1966-05-03, 52 y.o.   MRN: [id number]  HPI   52 year old male presents with new onset watery diarrhea x week.  Worse in last 2 days. He has off and on chills, fatigue. Intermittant nausea and dizziness with standing quickly. No abdominal pain. No emesis. Having BMs 10 times a day.  No blood in the stoll.   No sick contacts.  He is eating fruit, banana, grapes.. Decreased appetite.. Cannot eat regular foods. He is drinking orange juice, no water. Doesn't like the way it tastes. Only taking a few swallows.   Blood pressure 120/80, pulse (!) 57, temperature 98.1 F (36.7 C), temperature source Oral, weight 227 lb (103 kg), SpO2 97 %.  Review of Systems  Constitutional: Negative for fatigue and fever.  HENT: Negative for ear pain.   Eyes: Negative for pain.  Respiratory: Negative for cough and shortness of breath.   Cardiovascular: Negative for chest pain, palpitations and leg swelling.  Gastrointestinal: Negative for abdominal pain.  Genitourinary: Negative for dysuria.  Musculoskeletal: Negative for arthralgias.  Neurological: Positive for dizziness. Negative for syncope, light-headedness and headaches.  Psychiatric/Behavioral: Negative for dysphoric mood.       Objective:   Physical Exam  Constitutional: Vital signs are normal. He appears well-developed and well-nourished.  HENT:  Head: Normocephalic.  Right Ear: Hearing normal.  Left Ear: Hearing normal.  Nose: Nose normal.  Mouth/Throat: Oropharynx is clear and moist and mucous membranes are normal.  Neck: Trachea normal. Carotid bruit is not present. No thyroid mass and no thyromegaly present.  Cardiovascular: Normal rate, regular rhythm and normal pulses. Exam reveals no gallop, no distant heart sounds and no friction rub.  No murmur heard. No peripheral edema  Pulmonary/Chest: Effort normal and breath sounds normal. No respiratory distress.  Skin: Skin  is warm, dry and intact. No rash noted.  Psychiatric: He has a normal mood and affect. His speech is normal and behavior is normal. Thought content normal.          Assessment & Plan:

## 2017-06-09 NOTE — Assessment & Plan Note (Addendum)
Most likely viral gastroenteritis supportive care. Phenergan for nausea. Push fluids to avoid further dehydration.  if not continuing to improve.. follow up with PCP for possible stool testing.

## 2017-06-09 NOTE — Assessment & Plan Note (Signed)
Likely causing weakness and dizziness. Rest.. Push fluids. If worsening or new rapid heart rate, low BP.Marland Kitchen. Go to ER.

## 2017-06-11 ENCOUNTER — Telehealth: Payer: Self-pay | Admitting: Internal Medicine

## 2017-06-11 NOTE — Telephone Encounter (Signed)
Patient has dropped off FMLA to be filled out to cover the days he missed for work. 06/07/17 to 06/08/17. [facility] was [facility]. His diginose was viral gastroenteritis.  Please advise if you approve the forms to be filled out. Thank you.

## 2017-06-22 NOTE — Telephone Encounter (Signed)
Forms have been completed & placed in MD's box to review and sign.  °

## 2017-06-25 DIAGNOSIS — Z0279 Encounter for issue of other medical certificate: Secondary | ICD-10-CM

## 2017-06-25 NOTE — Telephone Encounter (Signed)
Forms have been completed &faxed, copy sent to scan, original mailed to patient &charged for.

## 2017-09-22 ENCOUNTER — Encounter (INDEPENDENT_AMBULATORY_CARE_PROVIDER_SITE_OTHER): Payer: Self-pay

## 2017-09-27 ENCOUNTER — Ambulatory Visit (INDEPENDENT_AMBULATORY_CARE_PROVIDER_SITE_OTHER): Payer: BLUE CROSS/BLUE SHIELD | Admitting: Internal Medicine

## 2017-09-27 ENCOUNTER — Encounter: Payer: Self-pay | Admitting: Internal Medicine

## 2017-09-27 VITALS — BP 122/78 | HR 66 | Temp 97.5°F | Resp 16 | Ht 69.0 in | Wt 230.0 lb

## 2017-09-27 DIAGNOSIS — J069 Acute upper respiratory infection, unspecified: Secondary | ICD-10-CM

## 2017-09-27 DIAGNOSIS — B9789 Other viral agents as the cause of diseases classified elsewhere: Secondary | ICD-10-CM

## 2017-09-27 MED ORDER — PROMETHAZINE-DM 6.25-15 MG/5ML PO SYRP: 5.0000 mL | ORAL_SOLUTION | Freq: Four times a day (QID) | ORAL | 0 refills | Status: AC | PRN

## 2017-09-27 NOTE — Progress Notes (Signed)
Subjective:  Patient ID: Brandon Lane, male    DOB: 09-29-1965  Age: 52 y.o. MRN: [id number]  CC: URI and Cough   HPI Brandon Lane presents for a 4-day history of sore throat and nonproductive cough.  Outpatient Medications Prior to Visit  Medication Sig Dispense Refill  . cetirizine (ZYRTEC) 10 MG tablet Take 1 tablet (10 mg total) by mouth daily. 30 tablet 11  . Colchicine (MITIGARE) 0.6 MG CAPS Take 1 capsule by mouth 2 (two) times daily. 60 capsule 3  . febuxostat (ULORIC) 40 MG tablet Take 1 tablet (40 mg total) by mouth daily. 90 tablet 3  . fluocinonide-emollient (LIDEX-E) 0.05 % cream Apply 1 application 2 (two) times daily topically. 60 g 2  . indomethacin (INDOCIN) 50 MG capsule Take 1 capsule (50 mg total) by mouth 3 (three) times daily as needed. 90 capsule 1  . mometasone (NASONEX) 50 MCG/ACT nasal spray Place 4 sprays into the nose daily. 17 g 12  . olmesartan (BENICAR) 40 MG tablet Take 1 tablet (40 mg total) by mouth daily. 90 tablet 3  . promethazine (PHENERGAN) 25 MG tablet Take 1 tablet (25 mg total) by mouth every 12 (twelve) hours as needed for nausea or vomiting. 10 tablet 0  . venlafaxine XR (EFFEXOR-XR) 150 MG 24 hr capsule TAKE 1 CAPSULE BY MOUTH DAILY WITH BREAKFAST. 90 capsule 1  . methylPREDNISolone (MEDROL DOSEPAK) 4 MG TBPK tablet TAKE AS DIRECTED 21 tablet 0   No facility-administered medications prior to visit.     ROS Review of Systems  Constitutional: Negative for chills, diaphoresis, fatigue and fever.  HENT: Positive for sore throat. Negative for facial swelling, sinus pressure, trouble swallowing and voice change.   Eyes: Negative for visual disturbance.  Respiratory: Positive for cough. Negative for chest tightness, shortness of breath and wheezing.   Cardiovascular: Negative for chest pain, palpitations and leg swelling.  Gastrointestinal: Negative for abdominal pain, diarrhea, nausea and vomiting.  Endocrine: Negative.   Genitourinary:  Negative.  Negative for difficulty urinating.  Musculoskeletal: Negative.  Negative for arthralgias, myalgias and neck pain.  Skin: Negative.  Negative for rash.  Neurological: Negative.  Negative for dizziness, weakness and light-headedness.  Hematological: Negative for adenopathy. Does not bruise/bleed easily.  Psychiatric/Behavioral: Negative.     Objective:  BP 122/78 (BP Location: Left Arm, Patient Position: Sitting, Cuff Size: Large)   Pulse 66   Temp (!) 97.5 F (36.4 C) (Oral)   Resp 16   Ht 5\' 9"  (1.753 m)   Wt 230 lb (104.3 kg)   SpO2 97%   BMI 33.97 kg/m   BP Readings from Last 3 Encounters:  09/27/17 122/78  06/09/17 120/80  04/17/17 120/80    Wt Readings from Last 3 Encounters:  09/27/17 230 lb (104.3 kg)  06/09/17 227 lb (103 kg)  04/17/17 231 lb (104.8 kg)    Physical Exam  Constitutional: He is oriented to person, place, and time.  Non-toxic appearance. He does not have a sickly appearance. He does not appear ill. No distress.  HENT:  Mouth/Throat: No oral lesions. No trismus in the jaw. No uvula swelling. Posterior oropharyngeal erythema present. No oropharyngeal exudate, posterior oropharyngeal edema or tonsillar abscesses.  Eyes: Conjunctivae are normal. Left eye exhibits no discharge. No scleral icterus.  Neck: Normal range of motion. Neck supple. No JVD present. No thyromegaly present.  Cardiovascular: Normal rate, regular rhythm and normal heart sounds. Exam reveals no gallop and no friction rub.  No murmur heard. Pulmonary/Chest:  Effort normal and breath sounds normal. No stridor. No respiratory distress. He has no wheezes. He has no rales.  Abdominal: Soft. Bowel sounds are normal. He exhibits no mass. There is no tenderness.  Musculoskeletal: Normal range of motion. He exhibits no edema, tenderness or deformity.  Lymphadenopathy:    He has no cervical adenopathy.  Neurological: He is alert and oriented to person, place, and time.  Skin: Skin is  warm and dry. He is not diaphoretic.  Vitals reviewed.   Lab Results  Component Value Date   WBC 7.7 07/03/2016   HGB 14.3 07/03/2016   HCT 41.7 07/03/2016   PLT 221.0 07/03/2016   GLUCOSE 94 07/03/2016   CHOL 151 07/03/2016   TRIG 216.0 (H) 07/03/2016   HDL 36.20 (L) 07/03/2016   LDLDIRECT 75.0 07/03/2016   LDLCALC 100 (H) 06/03/2012   ALT 20 07/03/2016   AST 15 07/03/2016   NA 139 07/03/2016   K 4.1 07/03/2016   CL 102 07/03/2016   CREATININE 1.16 07/03/2016   BUN 17 07/03/2016   CO2 30 07/03/2016   TSH 1.01 07/03/2016   PSA 0.23 07/03/2016   HGBA1C 5.3 07/03/2016    Mr Lumbar Spine Wo Contrast  Result Date: 02/01/2014 CLINICAL DATA:  Low back pain which waxes and wanes. EXAM: MRI LUMBAR SPINE WITHOUT CONTRAST TECHNIQUE: Multiplanar, multisequence MR imaging of the lumbar spine was performed. No intravenous contrast was administered. COMPARISON:  04/10/2013 plain films. FINDINGS: The scan extends from T11-12 through mid sacrum. Moderate loss of disc height and signal at L4-5. Endplate reactive changes at the L4-5 level indicate chronic disc space narrowing, similar to that seen at T12-L1 where there is an anterior protrusion. No worrisome osseous lesions. Normal conus. Paravertebral soft tissues unremarkable. The individual disc spaces were examined with axial images as follows: L1-L2:  Normal. L2-L3:  Normal. L3-L4:  Normal disc space.  Mild facet arthropathy.  No impingement. L4-L5: Central disc osteophyte complex narrows the spinal canal and RIGHT greater than LEFT subarticular zones. Mild facet and ligamentum flavum hypertrophy is superimposed. No definite L5 nerve root impingement in the canal. There is BILATERAL foraminal narrowing which could affect either L4 nerve root. L5-S1:  Mild bulge.  Mild facet arthropathy.  No impingement. IMPRESSION: Multilevel spondylosis as described. Disc space narrowing at L4-5 with BILATERAL foraminal narrowing. Correlate clinically for RIGHT or  LEFT L4 nerve root symptomatology. Electronically Signed   By: Davonna Belling M.D.   On: 02/01/2014 09:17    Assessment & Plan:   Brandon Lane was seen today for uri and cough.  Diagnoses and all orders for this visit:  Viral URI with cough -     promethazine-dextromethorphan (PROMETHAZINE-DM) 6.25-15 MG/5ML syrup; Take 5 mLs by mouth 4 (four) times daily as needed for up to 7 days for cough.   I have discontinued Daphine Deutscher Dorff's methylPREDNISolone. I am also having him start on promethazine-dextromethorphan. Additionally, I am having him maintain his cetirizine, mometasone, olmesartan, febuxostat, Colchicine, indomethacin, venlafaxine XR, fluocinonide-emollient, and promethazine.  Meds ordered this encounter  Medications  . promethazine-dextromethorphan (PROMETHAZINE-DM) 6.25-15 MG/5ML syrup    Sig: Take 5 mLs by mouth 4 (four) times daily as needed for up to 7 days for cough.    Dispense:  118 mL    Refill:  0     Follow-up: Return in about 3 weeks (around 10/18/2017).  Sanda Linger, MD

## 2017-09-27 NOTE — Patient Instructions (Signed)
Upper Respiratory Infection, Adult Most upper respiratory infections (URIs) are caused by a virus. A URI affects the nose, throat, and upper air passages. The most common type of URI is often called "the common cold." Follow these instructions at home:  Take medicines only as told by your doctor.  Gargle warm saltwater or take cough drops to comfort your throat as told by your doctor.  Use a warm mist humidifier or inhale steam from a shower to increase air moisture. This may make it easier to breathe.  Drink enough fluid to keep your pee (urine) clear or pale yellow.  Eat soups and other clear broths.  Have a healthy diet.  Rest as needed.  Go back to work when your fever is gone or your doctor says it is okay. ? You may need to stay home longer to avoid giving your URI to others. ? You can also wear a face mask and wash your hands often to prevent spread of the virus.  Use your inhaler more if you have asthma.  Do not use any tobacco products, including cigarettes, chewing tobacco, or electronic cigarettes. If you need help quitting, ask your doctor. Contact a doctor if:  You are getting worse, not better.  Your symptoms are not helped by medicine.  You have chills.  You are getting more short of breath.  You have brown or red mucus.  You have yellow or brown discharge from your nose.  You have pain in your face, especially when you bend forward.  You have a fever.  You have puffy (swollen) neck glands.  You have pain while swallowing.  You have white areas in the back of your throat. Get help right away if:  You have very bad or constant: ? Headache. ? Ear pain. ? Pain in your forehead, behind your eyes, and over your cheekbones (sinus pain). ? Chest pain.  You have long-lasting (chronic) lung disease and any of the following: ? Wheezing. ? Long-lasting cough. ? Coughing up blood. ? A change in your usual mucus.  You have a stiff neck.  You have  changes in your: ? Vision. ? Hearing. ? Thinking. ? Mood. This information is not intended to replace advice given to you by your health care provider. Make sure you discuss any questions you have with your health care provider. Document Released: 11/08/2007 Document Revised: 01/23/2016 Document Reviewed: 08/27/2013 Elsevier Interactive Patient Education  2018 [employer]  

## 2017-10-05 ENCOUNTER — Other Ambulatory Visit: Payer: Self-pay | Admitting: Internal Medicine

## 2017-10-05 DIAGNOSIS — M1000 Idiopathic gout, unspecified site: Secondary | ICD-10-CM

## 2017-10-17 ENCOUNTER — Ambulatory Visit: Payer: BLUE CROSS/BLUE SHIELD | Admitting: Internal Medicine

## 2017-10-25 ENCOUNTER — Ambulatory Visit: Payer: BLUE CROSS/BLUE SHIELD | Admitting: Internal Medicine

## 2017-11-01 ENCOUNTER — Telehealth: Payer: Self-pay | Admitting: Internal Medicine

## 2017-11-01 NOTE — Telephone Encounter (Signed)
I have received FMLA for the patient for OV on 09/27/17. Dr. Yetta Barre wrote a letter for the patient to be out until 10/01/17.   Forms have been completed & placed in providers box to review and sign.

## 2017-11-05 DIAGNOSIS — Z0279 Encounter for issue of other medical certificate: Secondary | ICD-10-CM

## 2017-11-05 NOTE — Telephone Encounter (Signed)
Forms have been signed, faxed to [facility] @ [phone], Copy sent to scan & charged for.   LVM to inform patient, & that the original is ready to be picked up.

## 2018-01-08 ENCOUNTER — Other Ambulatory Visit: Payer: Self-pay | Admitting: Internal Medicine

## 2018-01-08 DIAGNOSIS — F325 Major depressive disorder, single episode, in full remission: Secondary | ICD-10-CM

## 2018-08-12 ENCOUNTER — Other Ambulatory Visit: Payer: Self-pay | Admitting: Internal Medicine

## 2018-08-12 DIAGNOSIS — F325 Major depressive disorder, single episode, in full remission: Secondary | ICD-10-CM

## 2018-08-22 ENCOUNTER — Ambulatory Visit (INDEPENDENT_AMBULATORY_CARE_PROVIDER_SITE_OTHER): Payer: BLUE CROSS/BLUE SHIELD | Admitting: Internal Medicine

## 2018-08-22 ENCOUNTER — Other Ambulatory Visit (INDEPENDENT_AMBULATORY_CARE_PROVIDER_SITE_OTHER): Payer: BLUE CROSS/BLUE SHIELD

## 2018-08-22 ENCOUNTER — Encounter: Payer: Self-pay | Admitting: Internal Medicine

## 2018-08-22 ENCOUNTER — Other Ambulatory Visit: Payer: Self-pay

## 2018-08-22 VITALS — BP 144/94 | HR 72 | Temp 98.2°F | Resp 16 | Ht 69.0 in | Wt 230.0 lb

## 2018-08-22 DIAGNOSIS — Z125 Encounter for screening for malignant neoplasm of prostate: Secondary | ICD-10-CM | POA: Diagnosis not present

## 2018-08-22 DIAGNOSIS — K219 Gastro-esophageal reflux disease without esophagitis: Secondary | ICD-10-CM

## 2018-08-22 DIAGNOSIS — M1000 Idiopathic gout, unspecified site: Secondary | ICD-10-CM | POA: Diagnosis not present

## 2018-08-22 DIAGNOSIS — E781 Pure hyperglyceridemia: Secondary | ICD-10-CM | POA: Diagnosis not present

## 2018-08-22 DIAGNOSIS — Z0000 Encounter for general adult medical examination without abnormal findings: Secondary | ICD-10-CM | POA: Diagnosis not present

## 2018-08-22 DIAGNOSIS — I10 Essential (primary) hypertension: Secondary | ICD-10-CM

## 2018-08-22 DIAGNOSIS — Z6833 Body mass index (BMI) 33.0-33.9, adult: Secondary | ICD-10-CM

## 2018-08-22 DIAGNOSIS — F333 Major depressive disorder, recurrent, severe with psychotic symptoms: Secondary | ICD-10-CM

## 2018-08-22 DIAGNOSIS — E6609 Other obesity due to excess calories: Secondary | ICD-10-CM | POA: Diagnosis not present

## 2018-08-22 DIAGNOSIS — G4733 Obstructive sleep apnea (adult) (pediatric): Secondary | ICD-10-CM

## 2018-08-22 DIAGNOSIS — Z1211 Encounter for screening for malignant neoplasm of colon: Secondary | ICD-10-CM | POA: Insufficient documentation

## 2018-08-22 DIAGNOSIS — E559 Vitamin D deficiency, unspecified: Secondary | ICD-10-CM

## 2018-08-22 LAB — COMPREHENSIVE METABOLIC PANEL
ALT: 25 U/L (ref 0–53)
AST: 21 U/L (ref 0–37)
Albumin: 4.4 g/dL (ref 3.5–5.2)
Alkaline Phosphatase: 91 U/L (ref 39–117)
BUN: 13 mg/dL (ref 6–23)
CHLORIDE: 104 meq/L (ref 96–112)
CO2: 28 mEq/L (ref 19–32)
Calcium: 9.3 mg/dL (ref 8.4–10.5)
Creatinine, Ser: 0.98 mg/dL (ref 0.40–1.50)
GFR: 80.42 mL/min (ref >60.00)
Glucose, Bld: 133 mg/dL — ABNORMAL HIGH (ref 70–99)
POTASSIUM: 3.8 meq/L (ref 3.5–5.1)
Sodium: 139 mEq/L (ref 135–145)
Total Bilirubin: 1.3 mg/dL — ABNORMAL HIGH (ref 0.2–1.2)
Total Protein: 7 g/dL (ref 6.0–8.3)

## 2018-08-22 LAB — CBC WITH DIFFERENTIAL/PLATELET
Basophils Absolute: 0.1 10*3/uL (ref 0.0–0.1)
Basophils Relative: 1 % (ref 0.0–3.0)
EOS PCT: 3.1 % (ref 0.0–5.0)
Eosinophils Absolute: 0.2 10*3/uL (ref 0.0–0.7)
HCT: 42.8 % (ref 39.0–52.0)
Hemoglobin: 15 g/dL (ref 13.0–17.0)
Lymphocytes Relative: 37.4 % (ref 12.0–46.0)
Lymphs Abs: 2.5 10*3/uL (ref 0.7–4.0)
MCHC: 35.1 g/dL (ref 30.0–36.0)
MCV: 94.4 fl (ref 78.0–100.0)
MONO ABS: 0.6 10*3/uL (ref 0.1–1.0)
Monocytes Relative: 9.6 % (ref 3.0–12.0)
Neutro Abs: 3.2 10*3/uL (ref 1.4–7.7)
Neutrophils Relative %: 48.9 % (ref 43.0–77.0)
Platelets: 199 10*3/uL (ref 150.0–400.0)
RBC: 4.54 Mil/uL (ref 4.22–5.81)
RDW: 12.6 % (ref 11.5–15.5)
WBC: 6.6 10*3/uL (ref 4.0–10.5)

## 2018-08-22 LAB — URINALYSIS, ROUTINE W REFLEX MICROSCOPIC
Bilirubin Urine: NEGATIVE
Hgb urine dipstick: NEGATIVE
Ketones, ur: NEGATIVE
LEUKOCYTE UA: NEGATIVE
Nitrite: NEGATIVE
RBC / HPF: NONE SEEN (ref 0–?)
Specific Gravity, Urine: 1.02 (ref 1.000–1.030)
Total Protein, Urine: NEGATIVE
Urine Glucose: NEGATIVE
Urobilinogen, UA: 0.2 (ref 0.0–1.0)
pH: 6 (ref 5.0–8.0)

## 2018-08-22 LAB — LIPID PANEL
Cholesterol: 196 mg/dL (ref 0–200)
HDL: 34.6 mg/dL — ABNORMAL LOW (ref >39.00)
NonHDL: 161.01
Total CHOL/HDL Ratio: 6
Triglycerides: 226 mg/dL — ABNORMAL HIGH (ref 0.0–149.0)
VLDL: 45.2 mg/dL — AB (ref 0.0–40.0)

## 2018-08-22 LAB — PSA: PSA: 0.19 ng/mL (ref 0.10–4.00)

## 2018-08-22 LAB — URIC ACID: Uric Acid, Serum: 7.3 mg/dL (ref 4.0–7.8)

## 2018-08-22 LAB — TSH: TSH: 1.07 u[IU]/mL (ref 0.35–4.50)

## 2018-08-22 LAB — LDL CHOLESTEROL, DIRECT: Direct LDL: 124 mg/dL

## 2018-08-22 LAB — HEMOGLOBIN A1C: Hgb A1c MFr Bld: 5.2 % (ref 4.6–6.5)

## 2018-08-22 MED ORDER — ARIPIPRAZOLE 2 MG PO TABS: 2.0000 mg | ORAL_TABLET | Freq: Every day | ORAL | 0 refills | Status: DC

## 2018-08-22 NOTE — Progress Notes (Signed)
Subjective:  Patient ID: Brandon Lane, male    DOB: 03-11-1966  Age: 53 y.o. MRN: [id number]  CC: Annual Exam; Hypertension; and Depression   HPI Brandon Lane presents for a CPX.  He complains of worsening depression with crying spells, panic attacks, anxiety, and passive thoughts of suicide and homicide.  When asked how he would hurt himself or someone else he says "I guess I would do it with my fists."   He is not currently taking his antihypertensive.  He says the only time he experiences shortness of breath is when he is having a panic attack.  He denies CP or DOE.  Outpatient Medications Prior to Visit  Medication Sig Dispense Refill   venlafaxine XR (EFFEXOR-XR) 150 MG 24 hr capsule TAKE 1 CAPSULE BY MOUTH DAILY WITH BREAKFAST. 90 capsule 1   cetirizine (ZYRTEC) 10 MG tablet Take 1 tablet (10 mg total) by mouth daily. 30 tablet 11   Colchicine (MITIGARE) 0.6 MG CAPS Take 1 capsule by mouth 2 (two) times daily. 60 capsule 3   fluocinonide-emollient (LIDEX-E) 0.05 % cream Apply 1 application 2 (two) times daily topically. 60 g 2   indomethacin (INDOCIN) 50 MG capsule Take 1 capsule (50 mg total) by mouth 3 (three) times daily as needed. 90 capsule 1   indomethacin (INDOCIN) 50 MG capsule TAKE 1 CAPSULE THREE TIMES DAILY WITH MEALS. 90 capsule 0   mometasone (NASONEX) 50 MCG/ACT nasal spray Place 4 sprays into the nose daily. 17 g 12   olmesartan (BENICAR) 40 MG tablet Take 1 tablet (40 mg total) by mouth daily. 90 tablet 3   promethazine (PHENERGAN) 25 MG tablet Take 1 tablet (25 mg total) by mouth every 12 (twelve) hours as needed for nausea or vomiting. 10 tablet 0   ULORIC 40 MG tablet TAKE ONE TABLET BY MOUTH ONCE DAILY. 90 tablet 0   No facility-administered medications prior to visit.     ROS Review of Systems  Constitutional: Positive for fatigue. Negative for diaphoresis and unexpected weight change.  HENT: Negative.   Eyes: Negative for visual  disturbance.  Respiratory: Positive for apnea and shortness of breath. Negative for chest tightness and wheezing.        He is not using his CPAP device  Cardiovascular: Negative for chest pain, palpitations and leg swelling.  Gastrointestinal: Negative for abdominal pain, constipation, diarrhea, nausea and vomiting.  Endocrine: Negative.   Genitourinary: Negative.  Negative for difficulty urinating, dysuria, hematuria, penile swelling, scrotal swelling, testicular pain and urgency.  Musculoskeletal: Negative.  Negative for arthralgias, back pain, myalgias and neck pain.  Skin: Negative.  Negative for color change and pallor.  Neurological: Negative.  Negative for dizziness, weakness, light-headedness, numbness and headaches.  Hematological: Negative for adenopathy. Does not bruise/bleed easily.  Psychiatric/Behavioral: Positive for agitation, dysphoric mood and suicidal ideas. Negative for behavioral problems, confusion, decreased concentration, hallucinations, self-injury and sleep disturbance. The patient is nervous/anxious. The patient is not hyperactive.     Objective:  BP (!) 144/94 (BP Location: Left Arm, Patient Position: Sitting, Cuff Size: Normal)    Pulse 72    Temp 98.2 F (36.8 C) (Oral)    Resp 16    Ht  (1.753 m)    Wt 230 lb (104.3 kg)    SpO2 98%    BMI 33.97 kg/m   BP Readings from Last 3 Encounters:  08/22/18 (!) 144/94  09/27/17 122/78  06/09/17 120/80    Wt Readings from Last 3 Encounters:  08/22/18 230 lb (104.3 kg)  09/27/17 230 lb (104.3 kg)  06/09/17 227 lb (103 kg)    Physical Exam Vitals signs reviewed.  Constitutional:      Appearance: He is obese. He is not ill-appearing or diaphoretic.  HENT:     Nose: Nose normal. No congestion or rhinorrhea.     Mouth/Throat:     Mouth: Mucous membranes are moist.     Pharynx: Oropharynx is clear. No oropharyngeal exudate.  Eyes:     General: No scleral icterus.    Conjunctiva/sclera: Conjunctivae normal.      Pupils: Pupils are equal, round, and reactive to light.  Neck:     Musculoskeletal: Normal range of motion and neck supple. No neck rigidity or muscular tenderness.  Cardiovascular:     Rate and Rhythm: Normal rate and regular rhythm.     Heart sounds: No murmur. No gallop.   Pulmonary:     Effort: Pulmonary effort is normal.     Breath sounds: No stridor. No wheezing, rhonchi or rales.  Chest:     Comments: EKG -----  Sinus  Rhythm  WITHIN NORMAL LIMITS Abdominal:     General: Abdomen is protuberant. Bowel sounds are normal.     Palpations: There is no hepatomegaly, splenomegaly or mass.     Tenderness: There is no abdominal tenderness. There is no guarding or rebound.     Hernia: There is no hernia in the right inguinal area or left inguinal area.  Genitourinary:    Pubic Area: No rash.      Penis: Circumcised. No discharge, swelling or lesions.      Scrotum/Testes: Normal.        Right: Mass or tenderness not present.        Left: Mass or tenderness not present.     Epididymis:     Right: Normal.     Left: Normal.     Prostate: Normal. Not enlarged, not tender and no nodules present.     Rectum: Guaiac result negative. External hemorrhoid and internal hemorrhoid present. No mass, tenderness or anal fissure. Normal anal tone.  Musculoskeletal: Normal range of motion.        General: No swelling.     Right lower leg: No edema.     Left lower leg: No edema.  Lymphadenopathy:     Cervical: No cervical adenopathy.     Lower Body: No right inguinal adenopathy. No left inguinal adenopathy.  Skin:    General: Skin is warm and dry.     Coloration: Skin is not pale.     Findings: No erythema or rash.  Neurological:     General: No focal deficit present.     Mental Status: He is oriented to person, place, and time. Mental status is at baseline.  Psychiatric:        Attention and Perception: Attention and perception normal.        Mood and Affect: Mood is depressed. Affect  is angry. Affect is not labile, flat or tearful.        Speech: Speech normal.        Behavior: Behavior normal. Behavior is not agitated, slowed, aggressive, withdrawn or hyperactive. Behavior is cooperative.        Thought Content: Thought content normal. Thought content is not paranoid or delusional. Thought content does not include homicidal or suicidal ideation. Thought content does not include homicidal or suicidal plan.        Cognition and Memory: Cognition  normal.        Judgment: Judgment normal.     Lab Results  Component Value Date   WBC 6.6 08/22/2018   HGB 15.0 08/22/2018   HCT 42.8 08/22/2018   PLT 199.0 08/22/2018   GLUCOSE 133 (H) 08/22/2018   CHOL 196 08/22/2018   TRIG 226.0 (H) 08/22/2018   HDL 34.60 (L) 08/22/2018   LDLDIRECT 124.0 08/22/2018   LDLCALC 100 (H) 06/03/2012   ALT 25 08/22/2018   AST 21 08/22/2018   NA 139 08/22/2018   K 3.8 08/22/2018   CL 104 08/22/2018   CREATININE 0.98 08/22/2018   BUN 13 08/22/2018   CO2 28 08/22/2018   TSH 1.07 08/22/2018   PSA 0.19 08/22/2018   HGBA1C 5.2 08/22/2018    Mr Lumbar Spine Wo Contrast  Result Date: 02/01/2014 CLINICAL DATA:  Low back pain which waxes and wanes. EXAM: MRI LUMBAR SPINE WITHOUT CONTRAST TECHNIQUE: Multiplanar, multisequence MR imaging of the lumbar spine was performed. No intravenous contrast was administered. COMPARISON:  04/10/2013 plain films. FINDINGS: The scan extends from T11-12 through mid sacrum. Moderate loss of disc height and signal at L4-5. Endplate reactive changes at the L4-5 level indicate chronic disc space narrowing, similar to that seen at T12-L1 where there is an anterior protrusion. No worrisome osseous lesions. Normal conus. Paravertebral soft tissues unremarkable. The individual disc spaces were examined with axial images as follows: L1-L2:  Normal. L2-L3:  Normal. L3-L4:  Normal disc space.  Mild facet arthropathy.  No impingement. L4-L5: Central disc osteophyte complex  narrows the spinal canal and RIGHT greater than LEFT subarticular zones. Mild facet and ligamentum flavum hypertrophy is superimposed. No definite L5 nerve root impingement in the canal. There is BILATERAL foraminal narrowing which could affect either L4 nerve root. L5-S1:  Mild bulge.  Mild facet arthropathy.  No impingement. IMPRESSION: Multilevel spondylosis as described. Disc space narrowing at L4-5 with BILATERAL foraminal narrowing. Correlate clinically for RIGHT or LEFT L4 nerve root symptomatology. Electronically Signed   By: Davonna Belling M.D.   On: 02/01/2014 09:17    Assessment & Plan:   Major was seen today for annual exam, hypertension and depression.  Diagnoses and all orders for this visit:  Essential hypertension, benign- His blood pressure is not adequately well controlled.  I have asked him to restart the ARB.  His EKG is negative for LVH or ischemia.  His labs are negative for secondary causes or endorgan damage.  I will treat the vitamin D deficiency. -     CBC with Differential/Platelet; Future -     Comprehensive metabolic panel; Future -     TSH; Future -     Urinalysis, Routine w reflex microscopic; Future -     EKG 12-Lead -     VITAMIN D 25 Hydroxy (Vit-D Deficiency, Fractures); Future -     olmesartan (BENICAR) 40 MG tablet; Take 1 tablet (40 mg total) by mouth daily.  Routine general medical examination at a health care facility- Exam completed, labs reviewed, he refused a flu vaccine, Cologuard is ordered to screen for colon cancer/polyps, patient education material was given. -     Lipid panel; Future -     PSA; Future -     HIV Antibody (routine testing w rflx); Future  Pure hyperglyceridemia - His triglycerides are mildly elevated but do not need to be treated with a pharmaceutical intervention.  He was advised to improve his lifestyle modifications with diet/exercise/weight loss.  Gastroesophageal reflux disease  without esophagitis- He has had no symptoms  related to this recently. -     CBC with Differential/Platelet; Future  Idiopathic gout, unspecified chronicity, unspecified site- His uric acid level remains mildly elevated but he has had no recent episodes of gouty arthropathy.  At this time I do not think he should take a xanthine oxidase inhibitor. -     Uric acid; Future  Colon cancer screening -     Cologuard  Class 1 obesity due to excess calories with serious comorbidity and body mass index (BMI) of 33.0 to 33.9 in adult -     Hemoglobin A1c; Future  Major psychotic depression, recurrent (HCC)- I recommended that he add an antipsychotic to the venlafaxine. -     ARIPiprazole (ABILIFY) 2 MG tablet; Take 1 tablet (2 mg total) by mouth daily.  OSA (obstructive sleep apnea) -     Ambulatory referral to Pulmonology  Vitamin D deficiency disease -     Cholecalciferol 1.25 MG (50000 UT) capsule; Take 1 capsule (50,000 Units total) by mouth once a week.   I have discontinued Daphine Deutscher Demonbreun's cetirizine, mometasone, Colchicine, indomethacin, fluocinonide-emollient, promethazine, Uloric, and indomethacin. I am also having him start on ARIPiprazole and Cholecalciferol. Additionally, I am having him maintain his venlafaxine XR and olmesartan.  Meds ordered this encounter  Medications   ARIPiprazole (ABILIFY) 2 MG tablet    Sig: Take 1 tablet (2 mg total) by mouth daily.    Dispense:  90 tablet    Refill:  0   Cholecalciferol 1.25 MG (50000 UT) capsule    Sig: Take 1 capsule (50,000 Units total) by mouth once a week.    Dispense:  12 capsule    Refill:  0   olmesartan (BENICAR) 40 MG tablet    Sig: Take 1 tablet (40 mg total) by mouth daily.    Dispense:  90 tablet    Refill:  1     Follow-up: Return in about 3 months (around 11/22/2018).  Sanda Linger, MD

## 2018-08-22 NOTE — Patient Instructions (Signed)

## 2018-08-23 ENCOUNTER — Encounter: Payer: Self-pay | Admitting: Internal Medicine

## 2018-08-23 DIAGNOSIS — E559 Vitamin D deficiency, unspecified: Secondary | ICD-10-CM | POA: Insufficient documentation

## 2018-08-23 LAB — HIV ANTIBODY (ROUTINE TESTING W REFLEX): HIV 1&2 Ab, 4th Generation: NONREACTIVE

## 2018-08-23 LAB — VITAMIN D 25 HYDROXY (VIT D DEFICIENCY, FRACTURES): VITD: 15.31 ng/mL — ABNORMAL LOW (ref 30.00–100.00)

## 2018-08-23 MED ORDER — CHOLECALCIFEROL 1.25 MG (50000 UT) PO CAPS: 50000.0000 [IU] | ORAL_CAPSULE | ORAL | 0 refills | Status: DC

## 2018-08-25 MED ORDER — OLMESARTAN MEDOXOMIL 40 MG PO TABS: 40.0000 mg | ORAL_TABLET | Freq: Every day | ORAL | 1 refills | Status: DC

## 2018-08-27 ENCOUNTER — Telehealth: Payer: Self-pay | Admitting: Emergency Medicine

## 2018-08-27 ENCOUNTER — Other Ambulatory Visit: Payer: Self-pay | Admitting: Internal Medicine

## 2018-08-27 DIAGNOSIS — I10 Essential (primary) hypertension: Secondary | ICD-10-CM

## 2018-08-27 DIAGNOSIS — F325 Major depressive disorder, single episode, in full remission: Secondary | ICD-10-CM

## 2018-08-27 MED ORDER — VENLAFAXINE HCL ER 150 MG PO CP24: 150.0000 mg | ORAL_CAPSULE | Freq: Every day | ORAL | 1 refills | Status: DC

## 2018-08-27 MED ORDER — OLMESARTAN MEDOXOMIL 40 MG PO TABS: 40.0000 mg | ORAL_TABLET | Freq: Every day | ORAL | 1 refills | Status: DC

## 2018-08-27 NOTE — Telephone Encounter (Signed)
Pt came in stating he needs to refill on his  venlafaxine XR (EFFEXOR-XR) 150 MG 24 hr capsule and olmesartan (BENICAR) 40 MG tablet. Pharmacy is [facility] in [place]. Thanks.

## 2018-11-25 ENCOUNTER — Other Ambulatory Visit: Payer: Self-pay

## 2018-11-25 ENCOUNTER — Encounter: Payer: Self-pay | Admitting: Internal Medicine

## 2018-11-25 ENCOUNTER — Ambulatory Visit (INDEPENDENT_AMBULATORY_CARE_PROVIDER_SITE_OTHER): Payer: BC Managed Care – PPO | Admitting: Internal Medicine

## 2018-11-25 ENCOUNTER — Other Ambulatory Visit (INDEPENDENT_AMBULATORY_CARE_PROVIDER_SITE_OTHER): Payer: BC Managed Care – PPO

## 2018-11-25 VITALS — BP 130/76 | HR 61 | Temp 98.0°F | Resp 16 | Ht 69.0 in | Wt 240.0 lb

## 2018-11-25 DIAGNOSIS — E781 Pure hyperglyceridemia: Secondary | ICD-10-CM

## 2018-11-25 DIAGNOSIS — I10 Essential (primary) hypertension: Secondary | ICD-10-CM

## 2018-11-25 NOTE — Patient Instructions (Signed)
High Triglycerides Eating Plan  Triglycerides are a type of fat in the blood. High levels of triglycerides can increase your risk of heart disease and stroke. If your triglyceride levels are high, choosing the right foods can help lower your triglycerides and keep your heart healthy. Work with your health care provider or a diet and nutrition specialist (dietitian) to develop an eating plan that is right for you.  What are tips for following this plan?  General guidelines    · Lose weight, if you are overweight. For most people, losing 5-10 lbs (2-5 kg) helps lower triglyceride levels. A weight-loss plan may include.  ? 30 minutes of exercise at least 5 days a week.  ? Reducing the amount of calories, sugar, and fat you eat.  · Eat a wide variety of fresh fruits, vegetables, and whole grains. These foods are high in fiber.  · Eat foods that contain healthy fats, such as fatty fish, nuts, seeds, and olive oil.  · Avoid foods that are high in added sugar, added salt (sodium), saturated fat, and trans fat.  · Avoid low-fiber, refined carbohydrates such as white bread, crackers, noodles, and white rice.  · Avoid foods with partially hydrogenated oils (trans fats), such as fried foods or stick margarine.  · Limit alcohol intake to no more than 1 drink a day for nonpregnant women and 2 drinks a day for men. One drink equals 12 oz of beer, 5 oz of wine, or 1½ oz of hard liquor. Your health care provider may recommend that you drink less depending on your overall health.  Reading food labels  · Check food labels for the amount of saturated fat. Choose foods with no or very little saturated fat.  · Check food labels for the amount of trans fat. Choose foods with no trans fat.  · Check food labels for the amount of cholesterol. Choose foods low in cholesterol. Ask your dietitian how much cholesterol you should have each day.  · Check food labels for the amount of sodium. Choose foods with less than 140 milligrams (mg) per  serving.  Shopping  · Buy dairy products labeled as nonfat (skim) or low-fat (1%).  · Avoid buying processed or prepackaged foods. These are often high in added sugar, sodium, and fat.  Cooking  · Choose healthy fats when cooking, such as olive oil or canola oil.  · Cook foods using lower fat methods, such as baking, broiling, boiling, or grilling.  · Make your own sauces, dressings, and marinades when possible, instead of buying them. Store-bought sauces, dressings, and marinades are often high in sodium and sugar.  Meal planning  · Eat more home-cooked food and less restaurant, buffet, and fast food.  · Eat fatty fish at least 2 times each week. Examples of fatty fish include salmon, trout, mackerel, tuna, and herring.  · If you eat whole eggs, do not eat more than 3 egg yolks per week.  What foods are recommended?  The items listed may not be a complete list. Talk with your dietitian about what dietary choices are best for you.  Grains  Whole wheat or whole grain breads, crackers, cereals, and pasta. Unsweetened oatmeal. Bulgur. Barley. Quinoa. Brown rice. Whole wheat flour tortillas.  Vegetables  Fresh or frozen vegetables. Low-sodium canned vegetables.  Fruits  All fresh, canned (in natural juice), or frozen fruits.  Meats and other protein foods  Skinless chicken or turkey. Ground chicken or turkey. Lean cuts of pork, trimmed of   fat. Fish and seafood, especially salmon, trout, and herring. Egg whites. Dried beans, peas, or lentils. Unsalted nuts or seeds. Unsalted canned beans. Natural peanut or almond butter.  Dairy  Low-fat dairy products. Skim or low-fat (1%) milk. Reduced fat (2%) and low-sodium cheese. Low-fat ricotta cheese. Low-fat cottage cheese. Plain, low-fat yogurt.  Fats and oils  Tub margarine without trans fats. Light or reduced-fat mayonnaise. Light or reduced-fat salad dressings. Avocado. Safflower, olive, sunflower, soybean, and canola oils.  What foods are not recommended?  The items listed  may not be a complete list. Talk with your dietitian about what dietary choices are best for you.  Grains  White bread. White (regular) pasta. White rice. Cornbread. Bagels. Pastries. Crackers that contain trans fat.  Vegetables  Creamed or fried vegetables. Vegetables in a cheese sauce.  Fruits  Sweetened dried fruit. Canned fruit in syrup. Fruit juice.  Meats and other protein foods  Fatty cuts of meat. Ribs. Chicken wings. Bacon. Sausage. Bologna. Salami. Chitterlings. Fatback. Hot dogs. Bratwurst. Packaged lunch meats.  Dairy  Whole or reduced-fat (2%) milk. Half-and-half. Cream cheese. Full-fat or sweetened yogurt. Full-fat cheese. Nondairy creamers. Whipped toppings. Processed cheese or cheese spreads. Cheese curds.  Beverages  Alcohol. Sweetened drinks, such as soda, lemonade, fruit drinks, or punches.  Fats and oils  Butter. Stick margarine. Lard. Shortening. Ghee. Bacon fat. Tropical oils, such as coconut, palm kernel, or palm oils.  Sweets and desserts  Corn syrup. Sugars. Honey. Molasses. Candy. Jam and jelly. Syrup. Sweetened cereals. Cookies. Pies. Cakes. Donuts. Muffins. Ice cream.  Condiments  Store-bought sauces, dressings, and marinades that are high in sugar, such as ketchup and barbecue sauce.  Summary  · High levels of triglycerides can increase the risk of heart disease and stroke. Choosing the right foods can help lower your triglycerides.  · Eat plenty of fresh fruits, vegetables, and whole grains. Choose low-fat dairy and lean meats. Eat fatty fish at least twice a week.  · Avoid processed and prepackaged foods with added sugar, sodium, saturated fat, and trans fat.  · If you need suggestions or have questions about what types of food are good for you, talk with your health care provider or a dietitian.  This information is not intended to replace advice given to you by your health care provider. Make sure you discuss any questions you have with your health care provider.  Document Released:  03/09/2004 Document Revised: 07/25/2016 Document Reviewed: 07/25/2016  Elsevier Interactive Patient Education © 2019 [employer] Inc.

## 2018-11-25 NOTE — Progress Notes (Signed)
Subjective:  Patient ID: Brandon Lane, male    DOB: 1965/11/02  Age: 53 y.o. MRN: [id number]  CC: Hypertension and Hyperlipidemia   HPI Brandon Lane presents for f/up - He complains of weight gain but otherwise feels well and offers no other complaints.  He tells me his blood pressure has been well controlled.  Outpatient Medications Prior to Visit  Medication Sig Dispense Refill  . ARIPiprazole (ABILIFY) 2 MG tablet Take 1 tablet (2 mg total) by mouth daily. 90 tablet 0  . Cholecalciferol 1.25 MG (50000 UT) capsule Take 1 capsule (50,000 Units total) by mouth once a week. 12 capsule 0  . olmesartan (BENICAR) 40 MG tablet Take 1 tablet (40 mg total) by mouth daily. 90 tablet 1  . venlafaxine XR (EFFEXOR-XR) 150 MG 24 hr capsule Take 1 capsule (150 mg total) by mouth daily with breakfast. 90 capsule 1   No facility-administered medications prior to visit.     ROS Review of Systems  Constitutional: Positive for unexpected weight change (wt gain). Negative for diaphoresis and fatigue.  HENT: Negative.   Respiratory: Negative.  Negative for cough, chest tightness, shortness of breath and wheezing.   Cardiovascular: Negative for chest pain, palpitations and leg swelling.  Gastrointestinal: Negative for abdominal pain, constipation, diarrhea, nausea and vomiting.  Genitourinary: Negative.  Negative for difficulty urinating.  Musculoskeletal: Negative.  Negative for arthralgias and myalgias.  Skin: Negative.  Negative for color change and pallor.  Neurological: Negative.  Negative for dizziness, weakness, light-headedness and headaches.  Hematological: Negative for adenopathy. Does not bruise/bleed easily.  Psychiatric/Behavioral: Negative.  Negative for dysphoric mood and sleep disturbance. The patient is not nervous/anxious.     Objective:  BP 130/76 (BP Location: Left Arm, Patient Position: Sitting, Cuff Size: Large)   Pulse 61   Temp 98 F (36.7 C) (Oral)   Resp 16   Ht 5'  9" (1.753 m)   Wt 240 lb (108.9 kg)   SpO2 95%   BMI 35.44 kg/m   BP Readings from Last 3 Encounters:  11/25/18 130/76  08/22/18 (!) 144/94  09/27/17 122/78    Wt Readings from Last 3 Encounters:  11/25/18 240 lb (108.9 kg)  08/22/18 230 lb (104.3 kg)  09/27/17 230 lb (104.3 kg)    Physical Exam Vitals signs reviewed.  Constitutional:      Appearance: He is obese. He is not ill-appearing or diaphoretic.  HENT:     Nose: Nose normal.     Mouth/Throat:     Mouth: Mucous membranes are moist.     Pharynx: No oropharyngeal exudate or posterior oropharyngeal erythema.  Eyes:     General: No scleral icterus.    Conjunctiva/sclera: Conjunctivae normal.  Neck:     Musculoskeletal: Normal range of motion. No neck rigidity.  Cardiovascular:     Rate and Rhythm: Normal rate and regular rhythm.     Heart sounds: No murmur. No gallop.   Pulmonary:     Effort: Pulmonary effort is normal.     Breath sounds: No stridor. No wheezing, rhonchi or rales.  Abdominal:     General: Abdomen is protuberant. Bowel sounds are normal. There is no distension.     Palpations: There is no hepatomegaly or splenomegaly.     Tenderness: There is no abdominal tenderness.  Musculoskeletal: Normal range of motion.     Right lower leg: No edema.     Left lower leg: No edema.  Lymphadenopathy:     Cervical: No cervical  adenopathy.  Skin:    General: Skin is warm and dry.     Coloration: Skin is not pale.  Neurological:     General: No focal deficit present.     Mental Status: Mental status is at baseline.  Psychiatric:        Mood and Affect: Mood normal.        Behavior: Behavior normal.     Lab Results  Component Value Date   WBC 6.6 08/22/2018   HGB 15.0 08/22/2018   HCT 42.8 08/22/2018   PLT 199.0 08/22/2018   GLUCOSE 82 11/25/2018   CHOL 196 08/22/2018   TRIG 174.0 (H) 11/25/2018   HDL 34.60 (L) 08/22/2018   LDLDIRECT 124.0 08/22/2018   LDLCALC 100 (H) 06/03/2012   ALT 25  08/22/2018   AST 21 08/22/2018   NA 140 11/25/2018   K 4.0 11/25/2018   CL 104 11/25/2018   CREATININE 1.06 11/25/2018   BUN 13 11/25/2018   CO2 27 11/25/2018   TSH 1.07 08/22/2018   PSA 0.19 08/22/2018   HGBA1C 5.2 08/22/2018    Mr Lumbar Spine Wo Contrast  Result Date: 02/01/2014 CLINICAL DATA:  Low back pain which waxes and wanes. EXAM: MRI LUMBAR SPINE WITHOUT CONTRAST TECHNIQUE: Multiplanar, multisequence MR imaging of the lumbar spine was performed. No intravenous contrast was administered. COMPARISON:  04/10/2013 plain films. FINDINGS: The scan extends from T11-12 through mid sacrum. Moderate loss of disc height and signal at L4-5. Endplate reactive changes at the L4-5 level indicate chronic disc space narrowing, similar to that seen at T12-L1 where there is an anterior protrusion. No worrisome osseous lesions. Normal conus. Paravertebral soft tissues unremarkable. The individual disc spaces were examined with axial images as follows: L1-L2:  Normal. L2-L3:  Normal. L3-L4:  Normal disc space.  Mild facet arthropathy.  No impingement. L4-L5: Central disc osteophyte complex narrows the spinal canal and RIGHT greater than LEFT subarticular zones. Mild facet and ligamentum flavum hypertrophy is superimposed. No definite L5 nerve root impingement in the canal. There is BILATERAL foraminal narrowing which could affect either L4 nerve root. L5-S1:  Mild bulge.  Mild facet arthropathy.  No impingement. IMPRESSION: Multilevel spondylosis as described. Disc space narrowing at L4-5 with BILATERAL foraminal narrowing. Correlate clinically for RIGHT or LEFT L4 nerve root symptomatology. Electronically Signed   By: Rolla Flatten M.D.   On: 02/01/2014 09:17    Assessment & Plan:   Viraaj was seen today for hypertension and hyperlipidemia.  Diagnoses and all orders for this visit:  Essential hypertension, benign- His blood pressure is adequately well controlled.  Electrolytes and renal function are  normal. -     Basic metabolic panel; Future  Pure hyperglyceridemia- Improvement noted.  His triglycerides are down to 174 and do not need to be treated. -     Triglycerides; Future   I am having Brandon Lane maintain his ARIPiprazole, Cholecalciferol, olmesartan, and venlafaxine XR.  No orders of the defined types were placed in this encounter.    Follow-up: Return in about 6 months (around 05/27/2019).  Scarlette Calico, MD

## 2018-11-26 ENCOUNTER — Encounter: Payer: Self-pay | Admitting: Internal Medicine

## 2018-11-26 LAB — BASIC METABOLIC PANEL
BUN: 13 mg/dL (ref 6–23)
CO2: 27 mEq/L (ref 19–32)
Calcium: 8.7 mg/dL (ref 8.4–10.5)
Chloride: 104 mEq/L (ref 96–112)
Creatinine, Ser: 1.06 mg/dL (ref 0.40–1.50)
GFR: 73.39 mL/min (ref >60.00)
Glucose, Bld: 82 mg/dL (ref 70–99)
Potassium: 4 mEq/L (ref 3.5–5.1)
Sodium: 140 mEq/L (ref 135–145)

## 2018-11-26 LAB — TRIGLYCERIDES: Triglycerides: 174 mg/dL — ABNORMAL HIGH (ref 0.0–149.0)

## 2018-11-27 ENCOUNTER — Other Ambulatory Visit: Payer: Self-pay | Admitting: Internal Medicine

## 2018-11-27 DIAGNOSIS — F333 Major depressive disorder, recurrent, severe with psychotic symptoms: Secondary | ICD-10-CM

## 2019-03-12 ENCOUNTER — Other Ambulatory Visit: Payer: Self-pay | Admitting: Internal Medicine

## 2019-03-12 DIAGNOSIS — F333 Major depressive disorder, recurrent, severe with psychotic symptoms: Secondary | ICD-10-CM

## 2019-03-12 DIAGNOSIS — F325 Major depressive disorder, single episode, in full remission: Secondary | ICD-10-CM

## 2019-05-12 ENCOUNTER — Encounter: Payer: Self-pay | Admitting: Internal Medicine

## 2019-05-12 ENCOUNTER — Ambulatory Visit (INDEPENDENT_AMBULATORY_CARE_PROVIDER_SITE_OTHER): Payer: BC Managed Care – PPO | Admitting: Internal Medicine

## 2019-05-12 ENCOUNTER — Ambulatory Visit (INDEPENDENT_AMBULATORY_CARE_PROVIDER_SITE_OTHER)
Admission: RE | Admit: 2019-05-12 | Discharge: 2019-05-12 | Disposition: A | Discharge status: Home / Self Care | Payer: BC Managed Care – PPO | Source: Ambulatory Visit | Attending: Internal Medicine | Admitting: Internal Medicine

## 2019-05-12 ENCOUNTER — Other Ambulatory Visit: Payer: Self-pay

## 2019-05-12 VITALS — BP 140/80 | HR 75 | Temp 98.8°F | Resp 16 | Ht 69.0 in | Wt 235.0 lb

## 2019-05-12 DIAGNOSIS — S8002XA Contusion of left knee, initial encounter: Secondary | ICD-10-CM | POA: Diagnosis not present

## 2019-05-12 DIAGNOSIS — S8012XA Contusion of left lower leg, initial encounter: Secondary | ICD-10-CM

## 2019-05-12 DIAGNOSIS — S8782XA Crushing injury of left lower leg, initial encounter: Secondary | ICD-10-CM | POA: Diagnosis not present

## 2019-05-12 MED ORDER — TRAMADOL HCL 50 MG PO TABS: 50.0000 mg | ORAL_TABLET | Freq: Three times a day (TID) | ORAL | 0 refills | Status: AC | PRN

## 2019-05-12 NOTE — Patient Instructions (Signed)
Contusion A contusion is a deep bruise. Contusions are the result of a blunt injury to tissues and muscle fibers under the skin. The injury causes bleeding under the skin. The skin overlying the contusion may turn blue, purple, or yellow. Minor injuries will give you a painless contusion, but more severe injuries cause contusions that may stay painful and swollen for a few weeks. Follow these instructions at home: Pay attention to any changes in your symptoms. Let your health care provider know about them. Take these actions to relieve your pain. Managing pain, stiffness, and swelling   Use resting, icing, applying pressure (compression), and raising (elevating) the injured area. This is often called the RICE strategy. ? Rest the injured area. Return to your normal activities as told by your health care provider. Ask your health care provider what activities are safe for you. ? If directed, put ice on the injured area:  Put ice in a plastic bag.  Place a towel between your skin and the bag.  Leave the ice on for 20 minutes, 2-3 times per day. ? If directed, apply light compression to the injured area using an elastic bandage. Make sure the bandage is not wrapped too tightly. Remove and reapply the bandage as directed by your health care provider. ? If possible, raise (elevate) the injured area above the level of your heart while you are sitting or lying down. General instructions  Take over-the-counter and prescription medicines only as told by your health care provider.  Keep all follow-up visits as told by your health care provider. This is important. Contact a health care provider if:  Your symptoms do not improve after several days of treatment.  Your symptoms get worse.  You have difficulty moving the injured area. Get help right away if:  You have severe pain.  You have numbness in a hand or foot.  Your hand or foot turns pale or cold. Summary  A contusion is a deep  bruise.  Contusions are the result of a blunt injury to tissues and muscle fibers under the skin.  It is treated with rest, ice, compression, and elevation. You may be given over-the-counter medicines for pain.  Contact a health care provider if your symptoms do not improve, or get worse.  Get help right away if you have severe pain, have numbness, or the area turns pale or cold. This information is not intended to replace advice given to you by your health care provider. Make sure you discuss any questions you have with your health care provider. Document Released: 03/01/2005 Document Revised: 01/10/2018 Document Reviewed: 01/10/2018 Elsevier Patient Education  2020 Elsevier Inc.  

## 2019-05-12 NOTE — Progress Notes (Addendum)
Subjective:  Patient ID: Brandon Lane, male    DOB: 1966-04-03  Age: 53 y.o. MRN: [id number]  CC: Leg Injury  This visit occurred during the SARS-CoV-2 public health emergency.  Safety protocols were in place, including screening questions prior to the visit, additional usage of staff PPE, and extensive cleaning of exam room while observing appropriate contact time as indicated for disinfecting solutions.   HPI Brandon Lane presents for concerns about his left lower leg.  He tells me that 8 days ago he accidentally hit his left lower leg, anteriorly, just distal to the knee with a hammer.  He has not been resting it, icing it, or elevating it.  He complains that over the ensuing 8 days the bruising and swelling has migrated down towards his ankle.  He has been taking Motrin but tells me he still has pain that interferes with his sleep and his daily activities.  He denies paresthesias in his lower extremity or foot.  Outpatient Medications Prior to Visit  Medication Sig Dispense Refill  . ARIPiprazole (ABILIFY) 2 MG tablet TAKE 1 TABLET BY MOUTH DAILY. 90 tablet 0  . olmesartan (BENICAR) 40 MG tablet Take 1 tablet (40 mg total) by mouth daily. 90 tablet 1  . venlafaxine XR (EFFEXOR-XR) 150 MG 24 hr capsule TAKE 1 CAPSULE BY MOUTH DAILY WITH BREAKFAST. 90 capsule 0  . Cholecalciferol 1.25 MG (50000 UT) capsule Take 1 capsule (50,000 Units total) by mouth once a week. (Patient not taking: Reported on 05/12/2019) 12 capsule 0   No facility-administered medications prior to visit.     ROS Review of Systems  Constitutional: Negative.  Negative for chills, fatigue and fever.  HENT: Negative.   Eyes: Negative.   Respiratory: Negative for cough and chest tightness.   Cardiovascular: Positive for leg swelling. Negative for chest pain and palpitations.  Gastrointestinal: Negative for constipation, diarrhea, nausea and vomiting.  Endocrine: Negative.   Genitourinary: Negative.    Musculoskeletal: Positive for arthralgias. Negative for back pain and myalgias.  Skin: Positive for wound. Negative for color change.  Neurological: Negative.  Negative for dizziness, weakness and headaches.  Hematological: Negative for adenopathy. Does not bruise/bleed easily.  Psychiatric/Behavioral: Negative.     Objective:  BP 140/80 (BP Location: Left Arm, Patient Position: Sitting, Cuff Size: Large)   Pulse 75   Temp 98.8 F (37.1 C) (Oral)   Resp 16   Ht 5\' 9"  (1.753 m)   Wt 235 lb (106.6 kg)   SpO2 98%   BMI 34.70 kg/m   BP Readings from Last 3 Encounters:  05/12/19 140/80  11/25/18 130/76  08/22/18 (!) 144/94    Wt Readings from Last 3 Encounters:  05/12/19 235 lb (106.6 kg)  11/25/18 240 lb (108.9 kg)  08/22/18 230 lb (104.3 kg)    Physical Exam Vitals signs reviewed.  HENT:     Nose: Nose normal.     Mouth/Throat:     Pharynx: Oropharynx is clear.  Eyes:     Conjunctiva/sclera: Conjunctivae normal.  Neck:     Musculoskeletal: Neck supple.  Cardiovascular:     Rate and Rhythm: Normal rate and regular rhythm.     Pulses:          Dorsalis pedis pulses are 1+ on the right side and 1+ on the left side.       Posterior tibial pulses are 1+ on the right side and 1+ on the left side.  Pulmonary:     Effort:  Pulmonary effort is normal.     Breath sounds: No stridor. No wheezing or rales.  Abdominal:     General: Abdomen is protuberant. Bowel sounds are normal. There is no distension.     Palpations: Abdomen is soft. There is no hepatomegaly or splenomegaly.  Musculoskeletal: Normal range of motion.     Left knee: Normal. He exhibits normal range of motion, no swelling, no effusion, no ecchymosis, no deformity and no erythema. No tenderness found.     Left ankle: He exhibits ecchymosis. He exhibits normal range of motion. No tenderness.     Left lower leg: He exhibits tenderness, bony tenderness, swelling, deformity and laceration. No edema.       Legs:      Right foot: Normal range of motion. No deformity or foot drop.     Left foot: Normal range of motion. No deformity or foot drop.     Comments: There is dependent ecchymosis that has migrated down to the ankle.  See photo.  Feet:     Right foot:     Skin integrity: Skin integrity normal. No skin breakdown.     Left foot:     Skin integrity: Skin integrity normal. No skin breakdown.  Lymphadenopathy:     Cervical: No cervical adenopathy.  Skin:    General: Skin is warm.  Neurological:     General: No focal deficit present.     Mental Status: He is oriented to person, place, and time. Mental status is at baseline.     Lab Results  Component Value Date   WBC 6.6 08/22/2018   HGB 15.0 08/22/2018   HCT 42.8 08/22/2018   PLT 199.0 08/22/2018   GLUCOSE 82 11/25/2018   CHOL 196 08/22/2018   TRIG 174.0 (H) 11/25/2018   HDL 34.60 (L) 08/22/2018   LDLDIRECT 124.0 08/22/2018   LDLCALC 100 (H) 06/03/2012   ALT 25 08/22/2018   AST 21 08/22/2018   NA 140 11/25/2018   K 4.0 11/25/2018   CL 104 11/25/2018   CREATININE 1.06 11/25/2018   BUN 13 11/25/2018   CO2 27 11/25/2018   TSH 1.07 08/22/2018   PSA 0.19 08/22/2018   HGBA1C 5.2 08/22/2018    Mr Lumbar Spine Wo Contrast  Result Date: 02/01/2014 CLINICAL DATA:  Low back pain which waxes and wanes. EXAM: MRI LUMBAR SPINE WITHOUT CONTRAST TECHNIQUE: Multiplanar, multisequence MR imaging of the lumbar spine was performed. No intravenous contrast was administered. COMPARISON:  04/10/2013 plain films. FINDINGS: The scan extends from T11-12 through mid sacrum. Moderate loss of disc height and signal at L4-5. Endplate reactive changes at the L4-5 level indicate chronic disc space narrowing, similar to that seen at T12-L1 where there is an anterior protrusion. No worrisome osseous lesions. Normal conus. Paravertebral soft tissues unremarkable. The individual disc spaces were examined with axial images as follows: L1-L2:  Normal. L2-L3:  Normal.  L3-L4:  Normal disc space.  Mild facet arthropathy.  No impingement. L4-L5: Central disc osteophyte complex narrows the spinal canal and RIGHT greater than LEFT subarticular zones. Mild facet and ligamentum flavum hypertrophy is superimposed. No definite L5 nerve root impingement in the canal. There is BILATERAL foraminal narrowing which could affect either L4 nerve root. L5-S1:  Mild bulge.  Mild facet arthropathy.  No impingement. IMPRESSION: Multilevel spondylosis as described. Disc space narrowing at L4-5 with BILATERAL foraminal narrowing. Correlate clinically for RIGHT or LEFT L4 nerve root symptomatology. Electronically Signed   By: Unice BaileyJohn  Curnes M.D.  On: 02/01/2014 09:17    Dg Tibia/fibula Left  Result Date: 05/12/2019 CLINICAL DATA:  Hit with hammer 8 days ago.  Bruising EXAM: LEFT TIBIA AND FIBULA - 2 VIEW COMPARISON:  None. FINDINGS: There is no evidence of fracture or other focal bone lesions. Soft tissues are unremarkable. IMPRESSION: Negative. Electronically Signed   By: Franchot Gallo M.D.   On: 05/12/2019 11:33    Assessment & Plan:   Rykker was seen today for leg injury.  Diagnoses and all orders for this visit:  Crushing injury of left lower leg, initial encounter- Based on his symptoms, exam, and plain x-rays this is consistent with a lower extremity contusion.  He is neurovascularly intact.  I have asked him to stay out of work for the next week so that he can undergo a strict rest and elevation regimen.  He is not getting adequate pain relief with ibuprofen so I recommended that he take tramadol as needed for any discomfort. -     DG Tibia/Fibula Left; Future -     traMADol (ULTRAM) 50 MG tablet; Take 1 tablet (50 mg total) by mouth every 8 (eight) hours as needed for up to 7 days.  Contusion of left knee and lower leg, initial encounter- See above -     traMADol (ULTRAM) 50 MG tablet; Take 1 tablet (50 mg total) by mouth every 8 (eight) hours as needed for up to 7 days.    I am having Godfrey Pick start on traMADol. I am also having him maintain his Cholecalciferol, olmesartan, ARIPiprazole, and venlafaxine XR.  Meds ordered this encounter  Medications  . traMADol (ULTRAM) 50 MG tablet    Sig: Take 1 tablet (50 mg total) by mouth every 8 (eight) hours as needed for up to 7 days.    Dispense:  20 tablet    Refill:  0     Follow-up: Return in about 3 weeks (around 06/02/2019).  Scarlette Calico, MD

## 2019-06-02 ENCOUNTER — Encounter: Payer: Self-pay | Admitting: Internal Medicine

## 2019-06-02 ENCOUNTER — Other Ambulatory Visit: Payer: Self-pay

## 2019-06-02 ENCOUNTER — Ambulatory Visit (INDEPENDENT_AMBULATORY_CARE_PROVIDER_SITE_OTHER): Payer: BC Managed Care – PPO | Admitting: Internal Medicine

## 2019-06-02 VITALS — BP 154/94 | HR 86 | Temp 97.8°F | Resp 16 | Ht 69.0 in | Wt 242.0 lb

## 2019-06-02 DIAGNOSIS — Z1211 Encounter for screening for malignant neoplasm of colon: Secondary | ICD-10-CM

## 2019-06-02 DIAGNOSIS — I10 Essential (primary) hypertension: Secondary | ICD-10-CM

## 2019-06-02 DIAGNOSIS — E559 Vitamin D deficiency, unspecified: Secondary | ICD-10-CM

## 2019-06-02 DIAGNOSIS — R5382 Chronic fatigue, unspecified: Secondary | ICD-10-CM | POA: Diagnosis not present

## 2019-06-02 LAB — CBC WITH DIFFERENTIAL/PLATELET
Basophils Absolute: 0.1 10*3/uL (ref 0.0–0.1)
Basophils Relative: 1.3 % (ref 0.0–3.0)
Eosinophils Absolute: 0.6 10*3/uL (ref 0.0–0.7)
Eosinophils Relative: 6.8 % — ABNORMAL HIGH (ref 0.0–5.0)
HCT: 47.8 % (ref 39.0–52.0)
Hemoglobin: 16.5 g/dL (ref 13.0–17.0)
Lymphocytes Relative: 34.3 % (ref 12.0–46.0)
Lymphs Abs: 3 10*3/uL (ref 0.7–4.0)
MCHC: 34.5 g/dL (ref 30.0–36.0)
MCV: 96.6 fl (ref 78.0–100.0)
Monocytes Absolute: 0.6 10*3/uL (ref 0.1–1.0)
Monocytes Relative: 7 % (ref 3.0–12.0)
Neutro Abs: 4.4 10*3/uL (ref 1.4–7.7)
Neutrophils Relative %: 50.6 % (ref 43.0–77.0)
Platelets: 155 10*3/uL (ref 150.0–400.0)
RBC: 4.95 Mil/uL (ref 4.22–5.81)
RDW: 12.6 % (ref 11.5–15.5)
WBC: 8.7 10*3/uL (ref 4.0–10.5)

## 2019-06-02 LAB — BASIC METABOLIC PANEL
BUN: 14 mg/dL (ref 6–23)
CO2: 29 mEq/L (ref 19–32)
Calcium: 9.7 mg/dL (ref 8.4–10.5)
Chloride: 100 mEq/L (ref 96–112)
Creatinine, Ser: 1.05 mg/dL (ref 0.40–1.50)
GFR: 74.04 mL/min (ref >60.00)
Glucose, Bld: 112 mg/dL — ABNORMAL HIGH (ref 70–99)
Potassium: 3.6 mEq/L (ref 3.5–5.1)
Sodium: 138 mEq/L (ref 135–145)

## 2019-06-02 LAB — TSH: TSH: 1.52 u[IU]/mL (ref 0.35–4.50)

## 2019-06-02 MED ORDER — INDAPAMIDE 1.25 MG PO TABS: 1.2500 mg | ORAL_TABLET | Freq: Every day | ORAL | 0 refills | Status: DC
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
## 2019-06-02 NOTE — Progress Notes (Signed)
FMLA: 05/12/2019 to 05/16/2019 out for leg injury.

## 2019-06-02 NOTE — Patient Instructions (Signed)

## 2019-06-03 ENCOUNTER — Encounter: Payer: Self-pay | Admitting: Internal Medicine

## 2019-06-03 LAB — VITAMIN D 25 HYDROXY (VIT D DEFICIENCY, FRACTURES): VITD: 13.71 ng/mL — ABNORMAL LOW (ref 30.00–100.00)

## 2019-06-03 MED ORDER — CHOLECALCIFEROL 1.25 MG (50000 UT) PO CAPS: 50000.0000 [IU] | ORAL_CAPSULE | ORAL | 0 refills | Status: DC

## 2019-06-04 ENCOUNTER — Telehealth: Payer: Self-pay

## 2019-06-04 DIAGNOSIS — Z0279 Encounter for issue of other medical certificate: Secondary | ICD-10-CM

## 2019-06-04 NOTE — Telephone Encounter (Addendum)
Pt dropped off FMLA forma nd the biometric forms for self and spouse.  FMLA form is completed and faxed - lvm for pt informing of same.   Biometric form is missing pt and spouse signatures.    Called pt again and spoke to spouse. Informed that I was missing both of their signatures from the biometric form. Both agree to come in tomorrow to sign. Informed we were closing early.

## 2019-09-01 ENCOUNTER — Ambulatory Visit: Payer: BC Managed Care – PPO | Admitting: Internal Medicine

## 2019-10-03 ENCOUNTER — Other Ambulatory Visit: Payer: Self-pay | Admitting: Internal Medicine

## 2019-10-03 DIAGNOSIS — F325 Major depressive disorder, single episode, in full remission: Secondary | ICD-10-CM

## 2019-10-04 ENCOUNTER — Other Ambulatory Visit: Payer: Self-pay | Admitting: Internal Medicine

## 2019-10-04 DIAGNOSIS — F325 Major depressive disorder, single episode, in full remission: Secondary | ICD-10-CM

## 2020-04-08 ENCOUNTER — Other Ambulatory Visit: Payer: Self-pay | Admitting: Internal Medicine

## 2020-04-08 DIAGNOSIS — F325 Major depressive disorder, single episode, in full remission: Secondary | ICD-10-CM

## 2020-04-12 ENCOUNTER — Telehealth: Payer: Self-pay

## 2020-04-12 NOTE — Telephone Encounter (Signed)
Left message with wife for pt to complete Cologuard.  Wife states that pt will need another kit.  Verified shipping address & updated it in [facility].  Will call Cologuard to get another kit sent to patient.

## 2020-05-12 ENCOUNTER — Other Ambulatory Visit: Payer: Self-pay | Admitting: Internal Medicine

## 2020-05-12 ENCOUNTER — Encounter: Payer: Self-pay | Admitting: Internal Medicine

## 2020-05-12 ENCOUNTER — Other Ambulatory Visit: Payer: Self-pay

## 2020-05-12 ENCOUNTER — Ambulatory Visit (INDEPENDENT_AMBULATORY_CARE_PROVIDER_SITE_OTHER): Payer: Self-pay | Admitting: Internal Medicine

## 2020-05-12 VITALS — BP 140/90 | HR 80 | Temp 98.2°F | Resp 16 | Ht 69.0 in | Wt 229.0 lb

## 2020-05-12 DIAGNOSIS — K6289 Other specified diseases of anus and rectum: Secondary | ICD-10-CM | POA: Insufficient documentation

## 2020-05-12 NOTE — Patient Instructions (Signed)
Skin Biopsy, Care After This sheet gives you information about how to care for yourself after your procedure. Your health care provider may also give you more specific instructions. If you have problems or questions, contact your health care provider. What can I expect after the procedure? After the procedure, it is common to have:  Soreness.  Bruising.  Itching. Follow these instructions at home: Biopsy site care Follow instructions from your health care provider about how to take care of your biopsy site. Make sure you:  Wash your hands with soap and water before and after you change your bandage (dressing). If soap and water are not available, use hand sanitizer.  Apply ointment on your biopsy site as directed by your health care provider.  Change your dressing as told by your health care provider.  Leave stitches (sutures), skin glue, or adhesive strips in place. These skin closures may need to stay in place for 2 weeks or longer. If adhesive strip edges start to loosen and curl up, you may trim the loose edges. Do not remove adhesive strips completely unless your health care provider tells you to do that.  If the biopsy area bleeds, apply gentle pressure for 10 minutes. Check your biopsy site every day for signs of infection. Check for:  Redness, swelling, or pain.  Fluid or blood.  Warmth.  Pus or a bad smell.  General instructions  Rest and then return to your normal activities as told by your health care provider.  Take over-the-counter and prescription medicines only as told by your health care provider.  Keep all follow-up visits as told by your health care provider. This is important. Contact a health care provider if:  You have redness, swelling, or pain around your biopsy site.  You have fluid or blood coming from your biopsy site.  Your biopsy site feels warm to the touch.  You have pus or a bad smell coming from your biopsy site.  You have a  fever.  Your sutures, skin glue, or adhesive strips loosen or come off sooner than expected. Get help right away if:  You have bleeding that does not stop with pressure or a dressing. Summary  After the procedure, it is common to have soreness, bruising, and itching at the site.  Follow instructions from your health care provider about how to take care of your biopsy site.  Check your biopsy site every day for signs of infection.  Contact a health care provider if you have redness, swelling, or pain around your biopsy site, or your biopsy site feels warm to the touch.  Keep all follow-up visits as told by your health care provider. This is important. This information is not intended to replace advice given to you by your health care provider. Make sure you discuss any questions you have with your health care provider. Document Revised: 11/19/2017 Document Reviewed: 11/19/2017 Elsevier Patient Education  2020 [employer]  
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
## 2020-05-12 NOTE — Addendum Note (Signed)
Addended by: Waldemar Dickens B on: 05/12/2020 01:09 PM   Modules accepted: Orders

## 2020-05-14 ENCOUNTER — Telehealth: Payer: Self-pay | Admitting: Internal Medicine

## 2020-05-14 NOTE — Telephone Encounter (Signed)
Chantel called from [facility]   The order sent in is missing a sight in the requisition. They need the sight given to them to complete the order.  Please call her back at [phone]

## 2020-05-14 NOTE — Telephone Encounter (Signed)
   Chantel from [facility] called back, advised biopsy site  from perianal area

## 2020-05-17 ENCOUNTER — Other Ambulatory Visit: Payer: Self-pay | Admitting: Internal Medicine

## 2020-05-17 DIAGNOSIS — K6289 Other specified diseases of anus and rectum: Secondary | ICD-10-CM

## 2020-05-17 NOTE — Telephone Encounter (Signed)
Noted  

## 2020-05-19 ENCOUNTER — Ambulatory Visit (INDEPENDENT_AMBULATORY_CARE_PROVIDER_SITE_OTHER): Payer: 59 | Admitting: Internal Medicine

## 2020-05-19 ENCOUNTER — Other Ambulatory Visit: Payer: Self-pay

## 2020-05-19 ENCOUNTER — Encounter: Payer: Self-pay | Admitting: Internal Medicine

## 2020-05-19 VITALS — BP 126/82 | HR 68 | Temp 98.3°F | Resp 16 | Ht 69.0 in | Wt 227.0 lb

## 2020-05-19 DIAGNOSIS — K6289 Other specified diseases of anus and rectum: Secondary | ICD-10-CM

## 2020-05-19 DIAGNOSIS — K219 Gastro-esophageal reflux disease without esophagitis: Secondary | ICD-10-CM

## 2020-05-19 DIAGNOSIS — Z0000 Encounter for general adult medical examination without abnormal findings: Secondary | ICD-10-CM | POA: Diagnosis not present

## 2020-05-19 DIAGNOSIS — E6609 Other obesity due to excess calories: Secondary | ICD-10-CM | POA: Diagnosis not present

## 2020-05-19 DIAGNOSIS — E559 Vitamin D deficiency, unspecified: Secondary | ICD-10-CM | POA: Diagnosis not present

## 2020-05-19 DIAGNOSIS — I10 Essential (primary) hypertension: Secondary | ICD-10-CM | POA: Diagnosis not present

## 2020-05-19 DIAGNOSIS — Z6833 Body mass index (BMI) 33.0-33.9, adult: Secondary | ICD-10-CM

## 2020-05-19 DIAGNOSIS — Z1159 Encounter for screening for other viral diseases: Secondary | ICD-10-CM

## 2020-05-19 DIAGNOSIS — L989 Disorder of the skin and subcutaneous tissue, unspecified: Secondary | ICD-10-CM

## 2020-05-19 DIAGNOSIS — M1000 Idiopathic gout, unspecified site: Secondary | ICD-10-CM

## 2020-05-19 DIAGNOSIS — R21 Rash and other nonspecific skin eruption: Secondary | ICD-10-CM | POA: Diagnosis not present

## 2020-05-19 DIAGNOSIS — R739 Hyperglycemia, unspecified: Secondary | ICD-10-CM | POA: Diagnosis not present

## 2020-05-19 DIAGNOSIS — Z0001 Encounter for general adult medical examination with abnormal findings: Secondary | ICD-10-CM

## 2020-05-19 DIAGNOSIS — Z1211 Encounter for screening for malignant neoplasm of colon: Secondary | ICD-10-CM

## 2020-05-19 DIAGNOSIS — A5149 Other secondary syphilitic conditions: Secondary | ICD-10-CM

## 2020-05-19 DIAGNOSIS — N489 Disorder of penis, unspecified: Secondary | ICD-10-CM

## 2020-05-19 DIAGNOSIS — D539 Nutritional anemia, unspecified: Secondary | ICD-10-CM | POA: Insufficient documentation

## 2020-05-19 LAB — CBC WITH DIFFERENTIAL/PLATELET
Basophils Absolute: 0 10*3/uL (ref 0.0–0.1)
Basophils Relative: 0.7 % (ref 0.0–3.0)
Eosinophils Absolute: 0.1 10*3/uL (ref 0.0–0.7)
Eosinophils Relative: 3.4 % (ref 0.0–5.0)
HCT: 36.6 % — ABNORMAL LOW (ref 39.0–52.0)
Hemoglobin: 12.7 g/dL — ABNORMAL LOW (ref 13.0–17.0)
Lymphocytes Relative: 26.3 % (ref 12.0–46.0)
Lymphs Abs: 1 10*3/uL (ref 0.7–4.0)
MCHC: 34.8 g/dL (ref 30.0–36.0)
MCV: 98.9 fl (ref 78.0–100.0)
Monocytes Absolute: 0.5 10*3/uL (ref 0.1–1.0)
Monocytes Relative: 12.2 % — ABNORMAL HIGH (ref 3.0–12.0)
Neutro Abs: 2.2 10*3/uL (ref 1.4–7.7)
Neutrophils Relative %: 57.4 % (ref 43.0–77.0)
Platelets: 206 10*3/uL (ref 150.0–400.0)
RBC: 3.7 Mil/uL — ABNORMAL LOW (ref 4.22–5.81)
RDW: 14.7 % (ref 11.5–15.5)
WBC: 3.8 10*3/uL — ABNORMAL LOW (ref 4.0–10.5)

## 2020-05-19 LAB — URINALYSIS, ROUTINE W REFLEX MICROSCOPIC
Bilirubin Urine: NEGATIVE
Hgb urine dipstick: NEGATIVE
Ketones, ur: NEGATIVE
Leukocytes,Ua: NEGATIVE
Nitrite: NEGATIVE
Specific Gravity, Urine: 1.025 (ref 1.000–1.030)
Total Protein, Urine: NEGATIVE
Urine Glucose: NEGATIVE
Urobilinogen, UA: >=8 — AB (ref 0.0–1.0)
pH: 6.5 (ref 5.0–8.0)

## 2020-05-19 LAB — HEPATIC FUNCTION PANEL
ALT: 24 U/L (ref 0–53)
AST: 25 U/L (ref 0–37)
Albumin: 3.7 g/dL (ref 3.5–5.2)
Alkaline Phosphatase: 148 U/L — ABNORMAL HIGH (ref 39–117)
Bilirubin, Direct: 0.2 mg/dL (ref 0.0–0.3)
Total Bilirubin: 1.1 mg/dL (ref 0.2–1.2)
Total Protein: 7.5 g/dL (ref 6.0–8.3)

## 2020-05-19 LAB — LDL CHOLESTEROL, DIRECT: Direct LDL: 81 mg/dL

## 2020-05-19 LAB — BASIC METABOLIC PANEL
BUN: 9 mg/dL (ref 6–23)
CO2: 27 mEq/L (ref 19–32)
Calcium: 8.8 mg/dL (ref 8.4–10.5)
Chloride: 103 mEq/L (ref 96–112)
Creatinine, Ser: 0.91 mg/dL (ref 0.40–1.50)
GFR: 96.3 mL/min (ref >60.00)
Glucose, Bld: 140 mg/dL — ABNORMAL HIGH (ref 70–99)
Potassium: 3.9 mEq/L (ref 3.5–5.1)
Sodium: 138 mEq/L (ref 135–145)

## 2020-05-19 LAB — LIPID PANEL
Cholesterol: 123 mg/dL (ref 0–200)
HDL: 12.8 mg/dL — ABNORMAL LOW (ref >39.00)
NonHDL: 110.18
Total CHOL/HDL Ratio: 10
Triglycerides: 216 mg/dL — ABNORMAL HIGH (ref 0.0–149.0)
VLDL: 43.2 mg/dL — ABNORMAL HIGH (ref 0.0–40.0)

## 2020-05-19 LAB — VITAMIN D 25 HYDROXY (VIT D DEFICIENCY, FRACTURES): VITD: 11.93 ng/mL — ABNORMAL LOW (ref 30.00–100.00)

## 2020-05-19 LAB — SEDIMENTATION RATE: Sed Rate: 40 mm/hr — ABNORMAL HIGH (ref 0–20)

## 2020-05-19 LAB — HEMOGLOBIN A1C: Hgb A1c MFr Bld: 4.8 % (ref 4.6–6.5)

## 2020-05-19 LAB — URIC ACID: Uric Acid, Serum: 7.4 mg/dL (ref 4.0–7.8)

## 2020-05-19 LAB — TSH: TSH: 0.85 u[IU]/mL (ref 0.35–4.50)

## 2020-05-19 LAB — PSA: PSA: 0.16 ng/mL (ref 0.10–4.00)

## 2020-05-19 MED ORDER — CHOLECALCIFEROL 1.25 MG (50000 UT) PO CAPS: 50000.0000 [IU] | ORAL_CAPSULE | ORAL | 0 refills | Status: DC
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
## 2020-05-19 NOTE — Patient Instructions (Signed)

## 2020-05-20 LAB — RPR TITER: RPR Titer: 1:128 {titer} — ABNORMAL HIGH

## 2020-05-20 LAB — HIV ANTIBODY (ROUTINE TESTING W REFLEX): HIV 1&2 Ab, 4th Generation: NONREACTIVE

## 2020-05-20 LAB — RPR: RPR Ser Ql: REACTIVE — AB

## 2020-05-20 LAB — HEPATITIS C ANTIBODY
Hepatitis C Ab: NONREACTIVE
SIGNAL TO CUT-OFF: 0.08 (ref <1.00)

## 2020-05-20 LAB — FLUORESCENT TREPONEMAL AB(FTA)-IGG-BLD: Fluorescent Treponemal ABS: REACTIVE — AB

## 2020-05-21 ENCOUNTER — Telehealth: Payer: Self-pay | Admitting: Internal Medicine

## 2020-05-21 DIAGNOSIS — A5149 Other secondary syphilitic conditions: Secondary | ICD-10-CM | POA: Insufficient documentation

## 2020-05-21 NOTE — Telephone Encounter (Signed)
Cory with the [facility] called and needs to follow up about patient. Requesting a call from the CMA.   [phone] Kandee Keen

## 2020-05-24 ENCOUNTER — Other Ambulatory Visit: Payer: Self-pay

## 2020-05-24 ENCOUNTER — Ambulatory Visit (INDEPENDENT_AMBULATORY_CARE_PROVIDER_SITE_OTHER): Payer: 59

## 2020-05-24 DIAGNOSIS — A5149 Other secondary syphilitic conditions: Secondary | ICD-10-CM

## 2020-05-24 MED ORDER — PENICILLIN G BENZATHINE 2400000 UNIT/4ML IM SUSP
2.4000 10*6.[IU] | Freq: Once | INTRAMUSCULAR | Status: AC
  Administered 2020-05-24: 15:00:00 2400000 [IU] via INTRAMUSCULAR

## 2020-05-24 NOTE — Telephone Encounter (Signed)
Returned call to [employer]. LVM.

## 2020-05-24 NOTE — Progress Notes (Signed)
Bacillin LA 2,400,000units given per 1,200,000units per 38ml syringe IM per Dr Yetta Barre order. One syringe given right/left ventrogluteal, pt tolerated well.  Discussed side effects, pt verb understanding.  Pt states he has f/u scheduled for 06/17/20.  Please advise if needed sooner.

## 2020-05-24 NOTE — Telephone Encounter (Signed)
I have spoken to [facility]. He wanted verification of pt's address and phone number. He also asked if pt was also tested for HIV. I stated yes and that result was non-reactive.

## 2020-05-26 ENCOUNTER — Telehealth: Payer: Self-pay | Admitting: Internal Medicine

## 2020-05-26 NOTE — Telephone Encounter (Signed)
Misty Stanley from [facility] called  She contacted the patient to schedule an appointment for the referral. The patient declined the referral. Per patient was told by the office that he has syphilis and needed to take the antibiotic prescribed.   She would like to confirm with Dr.Jones if the referral is still needed for the patient.   Please give her a call back at [phone]

## 2020-06-15 ENCOUNTER — Encounter: Payer: Self-pay | Admitting: Internal Medicine

## 2020-06-15 ENCOUNTER — Ambulatory Visit (INDEPENDENT_AMBULATORY_CARE_PROVIDER_SITE_OTHER): Payer: Self-pay | Admitting: Internal Medicine

## 2020-06-15 ENCOUNTER — Other Ambulatory Visit: Payer: Self-pay

## 2020-06-15 VITALS — BP 124/80 | HR 72 | Temp 98.1°F | Resp 16 | Wt 226.0 lb

## 2020-06-15 DIAGNOSIS — A5149 Other secondary syphilitic conditions: Secondary | ICD-10-CM

## 2020-06-15 MED ORDER — PENICILLIN G BENZATHINE 1200000 UNIT/2ML IM SUSP
2.4000 10*6.[IU] | Freq: Once | INTRAMUSCULAR | Status: AC
  Administered 2020-06-15: 2.4 10*6.[IU] via INTRAMUSCULAR
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
## 2020-06-15 NOTE — Patient Instructions (Signed)
Syphilis Syphilis is an infection that can spread through sexual contact. The infection can cause serious complications, so it is important to get treatment right away. There are four stages of syphilis:  Primary stage. During this stage sores may form where the disease entered your body.  Secondary stage. During this stage skin rashes and lesions will form.  Latent stage. During this stage there are no symptoms, but the infection may still be contagious.  Tertiary stage. This stage happens 10-30 years after the infection starts. During this stage, the disease damages organs and can lead to death. Most people do not develop this stage of syphilis. What are the causes? This condition is caused by bacteria called Treponema pallidum. The condition can spread during sexual activity, such as during oral, anal, or vaginal sex. It can also be spread from mother to fetus during pregnancy. What increases the risk? You are more likely to develop this condition if:  You do not use a condom.  You have or had another sexually transmitted infection (STI).  You have multiple sex partners.  You use illegal drugs through an IV. What are the signs or symptoms? Symptoms of this condition depend on the stage of the disease. Primary stage  Painless sores (chancres) in and around the genital organs, mouth, or hands. The sores are usually firm and round. Secondary stage  A rash or sores. The rash usually does not itch.  A fever.  A headache.  A sore throat.  Swollen lymph nodes.  New sores in the mouth or on the genitals.  A feeling of being ill.  Pain in the joints.  Patchy hair loss.  Weight loss.  Fatigue. Latent stage There are no symptoms during this stage. Tertiary stage  Dementia.  Personality and mood changes.  Difficulty walking and coordinating movements.  Muscle weakness or paralysis.  Problems with coordination.  Heart failure.  Trouble  breathing.  Fainting.  Soft, rubbery growths on the skin, bones, or liver (gummas).  Sudden "lightning" pains, numbness, or tingling.  Vision changes.  Hearing changes.  Trouble controlling your urine and bowel movements. How is this diagnosed? This condition is diagnosed with:  A physical exam.  Blood tests.  Tests of the fluid (drainage) from a sore or rash.  Tests of the fluid around the spine (lumbar puncture). These tests are done to check for an infection in the brain or nervous system.  Imaging tests. These may be done to check for damage to the heart, aorta, or brain if the condition is in the tertiary stage. Tests may include: ? An X-ray. ? A CT scan. ? An MRI. ? An echocardiogram. This test takes a picture of the heart. ? An ultrasound.   How is this treated? This condition can be cured with antibiotic medicine. During the first day of treatment, the medicine may cause you to experience fever, chills, headache, nausea, or aching all over your body. This is normal and should go away within 24 hours. Follow these instructions at home: Medicines  Take over-the-counter and prescription medicines only as told by your health care provider.  Take your antibiotic medicine as told by your health care provider. Do not stop taking the antibiotic even if you start to feel better. Incomplete treatment will put you at risk for continued infection and could be life threatening.   General instructions  Do not have sex until your treatment is completed, or as directed by your health care provider.  Tell your recent sexual  partners that you were diagnosed with syphilis. It is important that they get treatment, even if they do not have symptoms.  Keep all follow-up visits as told by your health care provider. This is important. How is this prevented?  Use latex condoms correctly whenever you have sex.  Before you have sex, ask your partner if he or she has been tested for  STIs. Ask about the test results.  Avoid having multiple sexual partners. Contact a health care provider if:  You continue to have any of the following symptoms 24 hours after beginning treatment: ? Fever. ? Chills. ? Headache. ? Nausea. ? Aching all over your body.  Your symptoms do not improve, even with treatment. Get help right away if:  You have severe chest pain.  You have trouble walking or coordinating movements.  You are confused.  You lose vision or hearing.  You have numbness in your arms or legs.  You have a seizure.  You faint.  You have a severe headache that does not go away with medicine. Summary  Syphilis is an infection that can spread through sexual contact.  This condition can cause serious complications, so it is best to get treatment right away. The condition can be cured with antibiotic medicine.  This condition can also be spread from mother to fetus during pregnancy.  Take your antibiotic medicine as told by your health care provider.  Tell your recent sexual partners that you were diagnosed with syphilis. It is important that they get treatment, even if they do not have symptoms. This information is not intended to replace advice given to you by your health care provider. Make sure you discuss any questions you have with your health care provider. Document Revised: 12/03/2018 Document Reviewed: 07/18/2016 Elsevier Patient Education  2021 [employer]  

## 2020-06-17 ENCOUNTER — Ambulatory Visit: Payer: 59 | Admitting: Internal Medicine

## 2020-06-21 ENCOUNTER — Ambulatory Visit: Payer: Self-pay

## 2020-06-23 ENCOUNTER — Ambulatory Visit: Payer: Self-pay

## 2020-06-30 ENCOUNTER — Encounter: Payer: Self-pay | Admitting: Internal Medicine

## 2020-07-01 ENCOUNTER — Ambulatory Visit (INDEPENDENT_AMBULATORY_CARE_PROVIDER_SITE_OTHER): Payer: Self-pay

## 2020-07-01 ENCOUNTER — Other Ambulatory Visit: Payer: Self-pay

## 2020-07-01 DIAGNOSIS — A5149 Other secondary syphilitic conditions: Secondary | ICD-10-CM

## 2020-07-01 MED ORDER — PENICILLIN G BENZATHINE 2400000 UNIT/4ML IM SUSP
2.4000 10*6.[IU] | Freq: Once | INTRAMUSCULAR | Status: AC
  Administered 2020-07-01: 2400000 [IU] via INTRAMUSCULAR

## 2020-07-01 NOTE — Progress Notes (Signed)
Bicillin LA 2.4 million units ordered per Dr Yetta Barre. Bicillin given 1.2 million units x 2 in left ventrogluteal & pt tolerated well.

## 2020-08-21 ENCOUNTER — Emergency Department (HOSPITAL_COMMUNITY): Payer: Self-pay

## 2020-08-21 ENCOUNTER — Encounter (HOSPITAL_COMMUNITY): Payer: Self-pay | Admitting: Emergency Medicine

## 2020-08-21 ENCOUNTER — Inpatient Hospital Stay (HOSPITAL_COMMUNITY)
Admission: EM | Admit: 2020-08-21 | Discharge: 2020-08-26 | DRG: 442 | Disposition: A | Discharge status: Home / Self Care | Payer: Self-pay | Attending: Internal Medicine | Admitting: Internal Medicine

## 2020-08-21 ENCOUNTER — Other Ambulatory Visit: Payer: Self-pay

## 2020-08-21 ENCOUNTER — Inpatient Hospital Stay (HOSPITAL_COMMUNITY): Payer: Self-pay

## 2020-08-21 DIAGNOSIS — Z79899 Other long term (current) drug therapy: Secondary | ICD-10-CM

## 2020-08-21 DIAGNOSIS — S2239XA Fracture of one rib, unspecified side, initial encounter for closed fracture: Secondary | ICD-10-CM | POA: Diagnosis present

## 2020-08-21 DIAGNOSIS — Z87891 Personal history of nicotine dependence: Secondary | ICD-10-CM

## 2020-08-21 DIAGNOSIS — W11XXXA Fall on and from ladder, initial encounter: Secondary | ICD-10-CM | POA: Diagnosis present

## 2020-08-21 DIAGNOSIS — K7460 Unspecified cirrhosis of liver: Secondary | ICD-10-CM | POA: Diagnosis present

## 2020-08-21 DIAGNOSIS — R17 Unspecified jaundice: Secondary | ICD-10-CM

## 2020-08-21 DIAGNOSIS — S32019A Unspecified fracture of first lumbar vertebra, initial encounter for closed fracture: Secondary | ICD-10-CM | POA: Diagnosis present

## 2020-08-21 DIAGNOSIS — S2242XA Multiple fractures of ribs, left side, initial encounter for closed fracture: Secondary | ICD-10-CM | POA: Diagnosis present

## 2020-08-21 DIAGNOSIS — K759 Inflammatory liver disease, unspecified: Secondary | ICD-10-CM | POA: Diagnosis present

## 2020-08-21 DIAGNOSIS — E6609 Other obesity due to excess calories: Secondary | ICD-10-CM

## 2020-08-21 DIAGNOSIS — S32029A Unspecified fracture of second lumbar vertebra, initial encounter for closed fracture: Secondary | ICD-10-CM | POA: Diagnosis present

## 2020-08-21 DIAGNOSIS — F32A Depression, unspecified: Secondary | ICD-10-CM | POA: Diagnosis present

## 2020-08-21 DIAGNOSIS — G4733 Obstructive sleep apnea (adult) (pediatric): Secondary | ICD-10-CM | POA: Diagnosis present

## 2020-08-21 DIAGNOSIS — Z841 Family history of disorders of kidney and ureter: Secondary | ICD-10-CM

## 2020-08-21 DIAGNOSIS — B169 Acute hepatitis B without delta-agent and without hepatic coma: Principal | ICD-10-CM

## 2020-08-21 DIAGNOSIS — Z83438 Family history of other disorder of lipoprotein metabolism and other lipidemia: Secondary | ICD-10-CM

## 2020-08-21 DIAGNOSIS — Z823 Family history of stroke: Secondary | ICD-10-CM

## 2020-08-21 DIAGNOSIS — Z8261 Family history of arthritis: Secondary | ICD-10-CM

## 2020-08-21 DIAGNOSIS — K760 Fatty (change of) liver, not elsewhere classified: Secondary | ICD-10-CM | POA: Diagnosis present

## 2020-08-21 DIAGNOSIS — S32039A Unspecified fracture of third lumbar vertebra, initial encounter for closed fracture: Secondary | ICD-10-CM | POA: Diagnosis present

## 2020-08-21 DIAGNOSIS — Z811 Family history of alcohol abuse and dependence: Secondary | ICD-10-CM

## 2020-08-21 DIAGNOSIS — I10 Essential (primary) hypertension: Secondary | ICD-10-CM | POA: Diagnosis present

## 2020-08-21 DIAGNOSIS — Z833 Family history of diabetes mellitus: Secondary | ICD-10-CM

## 2020-08-21 DIAGNOSIS — Z6833 Body mass index (BMI) 33.0-33.9, adult: Secondary | ICD-10-CM

## 2020-08-21 DIAGNOSIS — Z20822 Contact with and (suspected) exposure to covid-19: Secondary | ICD-10-CM | POA: Diagnosis present

## 2020-08-21 DIAGNOSIS — K219 Gastro-esophageal reflux disease without esophagitis: Secondary | ICD-10-CM | POA: Diagnosis present

## 2020-08-21 DIAGNOSIS — A5149 Other secondary syphilitic conditions: Secondary | ICD-10-CM | POA: Diagnosis present

## 2020-08-21 DIAGNOSIS — Z8249 Family history of ischemic heart disease and other diseases of the circulatory system: Secondary | ICD-10-CM

## 2020-08-21 LAB — CBC WITH DIFFERENTIAL/PLATELET
Abs Immature Granulocytes: 0.02 10*3/uL (ref 0.00–0.07)
Basophils Absolute: 0 10*3/uL (ref 0.0–0.1)
Basophils Relative: 1 %
Eosinophils Absolute: 0.4 10*3/uL (ref 0.0–0.5)
Eosinophils Relative: 7 %
HCT: 31 % — ABNORMAL LOW (ref 39.0–52.0)
Hemoglobin: 11.9 g/dL — ABNORMAL LOW (ref 13.0–17.0)
Immature Granulocytes: 0 %
Lymphocytes Relative: 19 %
Lymphs Abs: 1.2 10*3/uL (ref 0.7–4.0)
MCH: 42.2 pg — ABNORMAL HIGH (ref 26.0–34.0)
MCHC: 38.4 g/dL — ABNORMAL HIGH (ref 30.0–36.0)
MCV: 109.9 fL — ABNORMAL HIGH (ref 80.0–100.0)
Monocytes Absolute: 0.7 10*3/uL (ref 0.1–1.0)
Monocytes Relative: 12 %
Neutro Abs: 3.8 10*3/uL (ref 1.7–7.7)
Neutrophils Relative %: 61 %
Platelets: 242 10*3/uL (ref 150–400)
RBC: 2.82 MIL/uL — ABNORMAL LOW (ref 4.22–5.81)
RDW: 21 % — ABNORMAL HIGH (ref 11.5–15.5)
WBC: 6.1 10*3/uL (ref 4.0–10.5)
nRBC: 0 % (ref 0.0–0.2)

## 2020-08-21 LAB — LIPASE, BLOOD: Lipase: 60 U/L — ABNORMAL HIGH (ref 11–51)

## 2020-08-21 LAB — ETHANOL: Alcohol, Ethyl (B): <10 mg/dL (ref <10)

## 2020-08-21 LAB — RESP PANEL BY RT-PCR (FLU A&B, COVID) ARPGX2
Influenza A by PCR: NEGATIVE
Influenza B by PCR: NEGATIVE
SARS Coronavirus 2 by RT PCR: NEGATIVE

## 2020-08-21 LAB — CBG MONITORING, ED: Glucose-Capillary: 89 mg/dL (ref 70–99)

## 2020-08-21 LAB — URINALYSIS, ROUTINE W REFLEX MICROSCOPIC
Glucose, UA: NEGATIVE mg/dL
Hgb urine dipstick: NEGATIVE
Ketones, ur: NEGATIVE mg/dL
Leukocytes,Ua: NEGATIVE
Nitrite: NEGATIVE
Protein, ur: NEGATIVE mg/dL
Specific Gravity, Urine: 1.045 — ABNORMAL HIGH (ref 1.005–1.030)
pH: 6 (ref 5.0–8.0)

## 2020-08-21 LAB — TSH: TSH: 1.768 u[IU]/mL (ref 0.350–4.500)

## 2020-08-21 LAB — RAPID URINE DRUG SCREEN, HOSP PERFORMED
Amphetamines: NOT DETECTED
Barbiturates: NOT DETECTED
Benzodiazepines: NOT DETECTED
Cocaine: NOT DETECTED
Opiates: POSITIVE — AB
Tetrahydrocannabinol: NOT DETECTED

## 2020-08-21 LAB — HEMOGLOBIN A1C
Hgb A1c MFr Bld: 4.6 % — ABNORMAL LOW (ref 4.8–5.6)
Mean Plasma Glucose: 85.32 mg/dL

## 2020-08-21 LAB — PROTIME-INR
INR: 1.1 (ref 0.8–1.2)
Prothrombin Time: 13.8 seconds (ref 11.4–15.2)

## 2020-08-21 LAB — HEPATIC FUNCTION PANEL
ALT: 703 U/L — ABNORMAL HIGH (ref 0–44)
AST: 704 U/L — ABNORMAL HIGH (ref 15–41)
Albumin: 2.8 g/dL — ABNORMAL LOW (ref 3.5–5.0)
Alkaline Phosphatase: 251 U/L — ABNORMAL HIGH (ref 38–126)
Bilirubin, Direct: 7.4 mg/dL — ABNORMAL HIGH (ref 0.0–0.2)
Indirect Bilirubin: 5.2 mg/dL — ABNORMAL HIGH (ref 0.3–0.9)
Total Bilirubin: 12.6 mg/dL — ABNORMAL HIGH (ref 0.3–1.2)
Total Protein: 6.6 g/dL (ref 6.5–8.1)

## 2020-08-21 LAB — COMPREHENSIVE METABOLIC PANEL
ALT: 724 U/L — ABNORMAL HIGH (ref 0–44)
AST: 720 U/L — ABNORMAL HIGH (ref 15–41)
Albumin: 3 g/dL — ABNORMAL LOW (ref 3.5–5.0)
Alkaline Phosphatase: 267 U/L — ABNORMAL HIGH (ref 38–126)
Anion gap: 8 (ref 5–15)
BUN: 9 mg/dL (ref 6–20)
CO2: 25 mmol/L (ref 22–32)
Calcium: 8.8 mg/dL — ABNORMAL LOW (ref 8.9–10.3)
Chloride: 100 mmol/L (ref 98–111)
Creatinine, Ser: 0.72 mg/dL (ref 0.61–1.24)
GFR, Estimated: >60 mL/min (ref >60)
Glucose, Bld: 111 mg/dL — ABNORMAL HIGH (ref 70–99)
Potassium: 3.9 mmol/L (ref 3.5–5.1)
Sodium: 133 mmol/L — ABNORMAL LOW (ref 135–145)
Total Bilirubin: 12.5 mg/dL — ABNORMAL HIGH (ref 0.3–1.2)
Total Protein: 7 g/dL (ref 6.5–8.1)

## 2020-08-21 LAB — BILIRUBIN, FRACTIONATED(TOT/DIR/INDIR)
Bilirubin, Direct: 7.5 mg/dL — ABNORMAL HIGH (ref 0.0–0.2)
Indirect Bilirubin: 5.1 mg/dL — ABNORMAL HIGH (ref 0.3–0.9)
Total Bilirubin: 12.6 mg/dL — ABNORMAL HIGH (ref 0.3–1.2)

## 2020-08-21 LAB — BRAIN NATRIURETIC PEPTIDE: B Natriuretic Peptide: 303 pg/mL — ABNORMAL HIGH (ref 0.0–100.0)

## 2020-08-21 LAB — HIV ANTIBODY (ROUTINE TESTING W REFLEX): HIV Screen 4th Generation wRfx: NONREACTIVE

## 2020-08-21 LAB — MAGNESIUM: Magnesium: 1.9 mg/dL (ref 1.7–2.4)

## 2020-08-21 LAB — ACETAMINOPHEN LEVEL: Acetaminophen (Tylenol), Serum: <10 ug/mL — ABNORMAL LOW (ref 10–30)

## 2020-08-21 LAB — GLUCOSE, CAPILLARY: Glucose-Capillary: 139 mg/dL — ABNORMAL HIGH (ref 70–99)

## 2020-08-21 LAB — PHOSPHORUS: Phosphorus: 3.6 mg/dL (ref 2.5–4.6)

## 2020-08-21 MED ORDER — METHOCARBAMOL 1000 MG/10ML IJ SOLN: 500.0000 mg | Freq: Four times a day (QID) | INTRAVENOUS | Status: DC | PRN

## 2020-08-21 MED ORDER — SODIUM CHLORIDE 0.9% FLUSH
3.0000 mL | Freq: Two times a day (BID) | INTRAVENOUS | Status: DC
  Administered 2020-08-21 – 2020-08-26 (×8): 3 mL via INTRAVENOUS

## 2020-08-21 MED ORDER — SENNA 8.6 MG PO TABS
1.0000 | ORAL_TABLET | Freq: Two times a day (BID) | ORAL | Status: DC
  Administered 2020-08-21 – 2020-08-25 (×6): 8.6 mg via ORAL
  Filled 2020-08-21 (×9): qty 1

## 2020-08-21 MED ORDER — SENNOSIDES-DOCUSATE SODIUM 8.6-50 MG PO TABS: 1.0000 | ORAL_TABLET | Freq: Every evening | ORAL | Status: DC | PRN

## 2020-08-21 MED ORDER — ALUM & MAG HYDROXIDE-SIMETH 200-200-20 MG/5ML PO SUSP
30.0000 mL | ORAL | Status: DC | PRN
  Administered 2020-08-21 – 2020-08-22 (×2): 30 mL via ORAL
  Filled 2020-08-21 (×2): qty 30

## 2020-08-21 MED ORDER — MAGNESIUM CITRATE PO SOLN: 1.0000 | Freq: Once | ORAL | Status: DC | PRN

## 2020-08-21 MED ORDER — IPRATROPIUM BROMIDE 0.02 % IN SOLN: 0.5000 mg | Freq: Four times a day (QID) | RESPIRATORY_TRACT | Status: DC | PRN

## 2020-08-21 MED ORDER — SODIUM CHLORIDE 0.9 % IV SOLN
INTRAVENOUS | Status: AC
  Administered 2020-08-21: 1000 mL via INTRAVENOUS

## 2020-08-21 MED ORDER — HYDROMORPHONE HCL 1 MG/ML IJ SOLN
1.0000 mg | INTRAMUSCULAR | Status: DC | PRN
  Administered 2020-08-21: 1 mg via INTRAVENOUS
  Filled 2020-08-21: qty 1

## 2020-08-21 MED ORDER — MORPHINE SULFATE (PF) 4 MG/ML IV SOLN
4.0000 mg | Freq: Once | INTRAVENOUS | Status: AC
  Administered 2020-08-21: 4 mg via INTRAVENOUS
  Filled 2020-08-21: qty 1

## 2020-08-21 MED ORDER — LEVALBUTEROL HCL 0.63 MG/3ML IN NEBU: 0.6300 mg | INHALATION_SOLUTION | Freq: Four times a day (QID) | RESPIRATORY_TRACT | Status: DC | PRN

## 2020-08-21 MED ORDER — ADULT MULTIVITAMIN W/MINERALS CH
1.0000 | ORAL_TABLET | Freq: Every day | ORAL | Status: DC
  Administered 2020-08-21 – 2020-08-26 (×6): 1 via ORAL
  Filled 2020-08-21 (×6): qty 1

## 2020-08-21 MED ORDER — HYDRALAZINE HCL 20 MG/ML IJ SOLN: 10.0000 mg | INTRAMUSCULAR | Status: DC | PRN

## 2020-08-21 MED ORDER — IBUPROFEN 800 MG PO TABS
800.0000 mg | ORAL_TABLET | Freq: Four times a day (QID) | ORAL | Status: DC | PRN
  Administered 2020-08-26: 800 mg via ORAL
  Filled 2020-08-21: qty 1

## 2020-08-21 MED ORDER — SODIUM CHLORIDE 0.9 % IV SOLN: 250.0000 mL | INTRAVENOUS | Status: DC | PRN

## 2020-08-21 MED ORDER — OXYCODONE HCL 5 MG PO TABS
5.0000 mg | ORAL_TABLET | ORAL | Status: DC | PRN
  Administered 2020-08-21 – 2020-08-26 (×6): 5 mg via ORAL
  Filled 2020-08-21 (×6): qty 1

## 2020-08-21 MED ORDER — IOHEXOL 300 MG/ML  SOLN
100.0000 mL | Freq: Once | INTRAMUSCULAR | Status: AC | PRN
  Administered 2020-08-21: 100 mL via INTRAVENOUS

## 2020-08-21 MED ORDER — VENLAFAXINE HCL ER 75 MG PO CP24
150.0000 mg | ORAL_CAPSULE | Freq: Every day | ORAL | Status: DC
  Administered 2020-08-21 – 2020-08-26 (×6): 150 mg via ORAL
  Filled 2020-08-21: qty 4
  Filled 2020-08-21 (×5): qty 2

## 2020-08-21 MED ORDER — ONDANSETRON HCL 4 MG/2ML IJ SOLN
4.0000 mg | Freq: Four times a day (QID) | INTRAMUSCULAR | Status: DC | PRN
  Administered 2020-08-23: 4 mg via INTRAVENOUS
  Filled 2020-08-21: qty 2

## 2020-08-21 MED ORDER — BISACODYL 5 MG PO TBEC: 5.0000 mg | DELAYED_RELEASE_TABLET | Freq: Every day | ORAL | Status: DC | PRN

## 2020-08-21 MED ORDER — SODIUM CHLORIDE 0.9% FLUSH: 3.0000 mL | INTRAVENOUS | Status: DC | PRN

## 2020-08-21 MED ORDER — HEPARIN SODIUM (PORCINE) 5000 UNIT/ML IJ SOLN
5000.0000 [IU] | Freq: Three times a day (TID) | INTRAMUSCULAR | Status: DC
  Administered 2020-08-21 – 2020-08-26 (×15): 5000 [IU] via SUBCUTANEOUS
  Filled 2020-08-21 (×14): qty 1

## 2020-08-21 MED ORDER — TRAZODONE HCL 50 MG PO TABS: 25.0000 mg | ORAL_TABLET | Freq: Every evening | ORAL | Status: DC | PRN

## 2020-08-21 MED ORDER — SODIUM CHLORIDE 0.9% FLUSH
3.0000 mL | Freq: Two times a day (BID) | INTRAVENOUS | Status: DC
  Administered 2020-08-22 – 2020-08-24 (×4): 3 mL via INTRAVENOUS

## 2020-08-21 NOTE — H&P (Signed)
History and Physical   Patient: Brandon Lane                            PCP: Janith Lima, MD                    DOB: June 19, 1965            DOA: 08/21/2020 UDJ:[id number]             DOS: 08/21/2020, 7:36 AM  Patient coming from:   HOME  I have personally reviewed patient's medical records, in electronic medical records, including:  [place] [facility], and care everywhere.    Chief Complaint:   Chief Complaint  Patient presents with  . Fall    History of present illness:    Brandon Lane is a 55 y.o. male with medical history significant of with past medical history of GERD, OSA, history of syphilis, hypertension, depression, DDD, status post recent fall from a ladder approximately 4 weeks ago, falling backwards hitting around 4 to 5 feet on his back presenting today with pain discomfort and yellowish of skin. Describes the fall as accidental, did not hit his head or loss consciousness.-Now experiencing left upper back, rib pain no radiation worse with movement and deep breathing. Admit that he has been taken ibuprofen and Tylenol without relief. Per patient and family he is more yellowish in skin, which has gotten worse and over past few days.  Patient Denies having: Fever, Chills, Cough, SOB, Chest Pain, Abd pain, N/V/D, headache, dizziness, lightheadedness,  Dysuria, Joint pain, rash, open wounds.. Denies of having any weakness in his his legs or arms.  ED Course:   Vitals: Medically stable, Abnormal labs; elevated LFTs AST 720, ALT 724, alk phos 267, total bilirubin 12.5, lipase 60, Tylenol level < 10 Chest x-ray multiple rib fractures negative for pneumothorax, EKG normal sinus rhythm, CT abdomen pelvis chest reviewed consistent with rib fractures, subacute lower lumbar fractures, CT did not reveal any obstruction-inflammation of the duodenum and liver nodules identified, subacute left-sided rib fractures and lumbar spine transverse process fractures    Review of Systems:  As per HPI, otherwise 10 point review of systems were negative.   ----------------------------------------------------------------------------------------------------------------------  No Known Allergies  Home MEDs:  Prior to Admission medications   Medication Sig Start Date End Date Taking? Authorizing Provider  ibuprofen (ADVIL) 200 MG tablet Take 800 mg by mouth every 6 (six) hours as needed for headache or mild pain.   Yes [provider]  venlafaxine XR (EFFEXOR-XR) 150 MG 24 hr capsule TAKE 1 CAPSULE BY MOUTH DAILY WITH BREAKFAST. Patient taking differently: Take 150 mg by mouth daily with breakfast. 04/08/20  Yes Janith Lima, MD  Cholecalciferol 1.25 MG (50000 UT) capsule Take 1 capsule (50,000 Units total) by mouth once a week. Patient not taking: Reported on 08/21/2020 05/19/20   Janith Lima, MD    PRN MEDs: sodium chloride, bisacodyl, hydrALAZINE, HYDROmorphone (DILAUDID) injection, ipratropium, levalbuterol, magnesium citrate, methocarbamol (ROBAXIN) IV, ondansetron (ZOFRAN) IV, oxyCODONE, senna-docusate, sodium chloride flush, traZODone  Past Medical History:  Diagnosis Date  . DDD (degenerative disc disease), lumbar   . Depression   . GERD (gastroesophageal reflux disease)     History reviewed. No pertinent surgical history.   reports that he quit smoking about 21 years ago. His smoking use included cigarettes. He has a 20.00 pack-year smoking history. He has never used smokeless tobacco. He reports current  alcohol use of about 10.0 standard drinks of alcohol per week. He reports that he does not use drugs.   Family History  Problem Relation Age of Onset  . Arthritis Mother   . Hyperlipidemia Mother   . Stroke Mother   . Hypertension Mother   . Kidney disease Mother   . Diabetes Mother   . Alcohol abuse Father   . Hypertension Sister   . Cancer Neg Hx   . Early death Neg Hx   . Heart disease Brother   . Hypertension Brother   . Stroke Brother      Physical Exam:   Vitals:   08/21/20 0330 08/21/20 0400 08/21/20 0430 08/21/20 0500  BP: 127/72 119/62 129/80 131/82  Pulse: 86 84 86 80  Resp: 18 (!) 24 (!) 23 16  Temp:      TempSrc:      SpO2: 96% 98% 98% 97%  Weight:      Height:       Constitutional: NAD, calm, comfortable Eyes: PERRL, lids and conjunctivae normal -eyes-icteric ENMT: Mucous membranes are moist. Posterior pharynx clear of any exudate or lesions.Normal dentition.  Neck: normal, supple, no masses, no thyromegaly Respiratory: clear to auscultation bilaterally, no wheezing, no crackles. Normal respiratory effort. No accessory muscle use.  Cardiovascular: Regular rate and rhythm, no murmurs / rubs / gallops. No extremity edema. 2+ pedal pulses. No carotid bruits.  Abdomen: no tenderness, no masses palpated. No hepatosplenomegaly. Bowel sounds positive.  Musculoskeletal: Posterior chest wall tenderness on ribs 4 through 8, mild lumbar tenderness, range of motion intact  no clubbing / cyanosis. No joint deformity upper and lower extremities. Good ROM, no contractures. Normal muscle tone.  Neurologic: CN II-XII grossly intact. Sensation intact, DTR normal. Strength 5/5 in all 4.  Psychiatric: Normal judgment and insight. Alert and oriented x 3. Normal mood.  Skin: Diffuse yellowish skin-jaundice, icteric no rashes, lesions, ulcers. No induration          Labs on admission:    I have personally reviewed following labs and imaging studies  CBC: Recent Labs  Lab 08/21/20 0317  WBC 6.1  NEUTROABS 3.8  HGB 11.9*  HCT 31.0*  MCV 109.9*  PLT 619   Basic Metabolic Panel: Recent Labs  Lab 08/21/20 0317  NA 133*  K 3.9  CL 100  CO2 25  GLUCOSE 111*  BUN 9  CREATININE 0.72  CALCIUM 8.8*  MG 1.9  PHOS 3.6   GFR: Estimated Creatinine Clearance: 128.5 mL/min (by C-G formula based on SCr of 0.72 mg/dL). Liver Function Tests: Recent Labs  Lab 08/21/20 0317  AST 720*  ALT 724*  ALKPHOS 267*   BILITOT 12.5*  PROT 7.0  ALBUMIN 3.0*   Recent Labs  Lab 08/21/20 0317  LIPASE 60*   No results for input(s): AMMONIA in the last 168 hours. Coagulation Profile: Recent Labs  Lab 08/21/20 0317  INR 1.1   Urine analysis:    Component Value Date/Time   COLORURINE YELLOW 05/19/2020 1106   APPEARANCEUR CLEAR 05/19/2020 1106   LABSPEC 1.025 05/19/2020 1106   PHURINE 6.5 05/19/2020 1106   GLUCOSEU NEGATIVE 05/19/2020 1106   HGBUR NEGATIVE 05/19/2020 1106   [place] 05/19/2020 1106   [place] 05/19/2020 1106   UROBILINOGEN >=8.0 (A) 05/19/2020 1106   NITRITE NEGATIVE 05/19/2020 1106   LEUKOCYTESUR NEGATIVE 05/19/2020 1106     Radiologic Exams on Admission:   DG Ribs Unilateral W/Chest Left  Result Date: 08/21/2020 CLINICAL DATA:  55 year old  male with fall and left chest wall pain. EXAM: LEFT RIBS AND CHEST - 3+ VIEW COMPARISON:  None. FINDINGS: There multiple mildly displaced left posterior rib fractures involving the fourth-eighth ribs. No focal consolidation, pleural effusion, pneumothorax. The cardiac silhouette is within limits. IMPRESSION: Multiple mildly displaced left posterior rib fractures. No pneumothorax. Electronically Signed   By: Anner Crete M.D.   On: 08/21/2020 02:29   DG Thoracic Spine 2 View  Result Date: 08/21/2020 CLINICAL DATA:  55 year old male with fall. EXAM: THORACIC SPINE 2 VIEWS COMPARISON:  Chest radiograph dated 08/21/2020. FINDINGS: There is no acute fracture or subluxation of the thoracic spine. Multilevel degenerative changes and mild chronic compression fractures. Partially visualized multiple left posterior rib fractures. The soft tissues are unremarkable. IMPRESSION: 1. No acute fracture or subluxation of the thoracic spine. 2. Left posterior rib fractures. Electronically Signed   By: Anner Crete M.D.   On: 08/21/2020 02:31   CT Chest W Contrast  Result Date: 08/21/2020 CLINICAL DATA:  Fall from ladder 3 weeks  ago, persistent back pain. EXAM: CT CHEST, ABDOMEN, AND PELVIS WITH CONTRAST TECHNIQUE: Multidetector CT imaging of the chest, abdomen and pelvis was performed following the standard protocol during bolus administration of intravenous contrast. CONTRAST:  132mL OMNIPAQUE IOHEXOL 300 MG/ML  SOLN COMPARISON:  Radiographs 08/21/2020 FINDINGS: CT CHEST FINDINGS Cardiovascular:Musculoskeletal: No acute osseous abnormality or suspicious osseous lesion. Subacute appearing left L1-L3 transverse process fractures. 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 Levocurvature of the lumbar spine, apex L4-5. Multilevel degenerative changes are present in the imaged portions of the spine. Bones of the pelvis are intact and congruent. Proximal femora are intact and normally located.   IMPRESSION: 1. Subacute appearing left posterior fourth through eighth rib fractures. Adjacent thickening appears to be largely subpleural without layering effusion or pneumothorax. Some minimal adjacent ground-glass could be atelectatic related to splinting versus resolving pulmonary contusive change. 2. Subacute appearing left L1-L3 transverse process fractures. 3. Minimal stranding adjacent the first and second portion of the duodenum without significant wall thickening, extraluminal gas or free fluid. Findings could reflect a mild duodenitis or peptic ulcer disease in the appropriate clinical setting 4. Mildly lobular hepatic surface, suggestive of cirrhosis. Splenomegaly, could be an early manifestation of portal hypertension in the setting of cirrhosis. 5. 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 Anatomic variant. These results were called by telephone at the time of interpretation on 08/21/2020 at 6:10 am to provider [facility] , who verbally acknowledged these results. Electronically Signed   By: Lovena Le M.D.   On:  08/21/2020 06:10   CT ABDOMEN PELVIS W CONTRAST  Result Date: 08/21/2020 CLINICAL DATA:  Fall from ladder 3 weeks ago, persistent back pain. EXAM: CT CHEST, ABDOMEN, AND PELVIS WITH CONTRAST TECHNIQUE: Multidetector CT imaging of the chest, abdomen and pelvis was performed following the standard protocol during bolus administration of intravenous contrast. CONTRAST:  167mL OMNIPAQUE IOHEXOL 300 MG/ML  SOLN COMPARISON:  Radiographs 08/21/2020 FINDINGS: CT CHEST FINDINGS Cardiovascular: The aortic root is suboptimally asseslia. Other: No abdominopelvic free fluid or free gas. No bowel containing hernias. Musculoskeletal: No acute osseous abnormality or suspicious osseous lesion. Subacute appearing left L1-L3 transverse process fractures. 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 Levocurvature of the lumbar spine, apex L4-5. Multilevel degenerative changes are present in the imaged portions of the spine. Bones of the pelvis are  intact and congruent. Proximal femora are intact and normally located.   IMPRESSION:  1. Subacute appearing left posterior fourth through eighth rib fractures. Adjacent thickening appears to be largely subpleural without layering effusion or pneumothorax. Some minimal adjacent ground-glass could be atelectatic related to splinting versus resolving pulmonary contusive change.  2. Subacute appearing left L1-L3 transverse process fractures.  3. Minimal stranding adjacent the first and second portion of the duodenum without significant wall thickening, extraluminal gas or free fluid. Findings could reflect a mild duodenitis or peptic ulcer disease in the appropriate clinical setting  4. Mildly lobular hepatic surface, suggestive of cirrhosis. Splenomegaly, could be an early manifestation of portal hypertension in the setting of cirrhosis.  5. Sacralization of the left L5 transverse process with pseudoarticulation with the  adjacent sacral ala. Anatomic variant. These results were called by telephone at the time of interpretation on 08/21/2020 at 6:10 am to provider [facility] , who verbally acknowledged these results. Electronically Signed   By: Lovena Le M.D.   On: 08/21/2020 06:10    EKG:   Independently reviewed.  Orders placed or performed during the hospital encounter of 08/21/20  . EKG 12-Lead  . EKG 12-Lead  . EKG 12-Lead  . EKG 12-Lead  . EKG 12-Lead   ---------------------------------------------------------------------------------------------------------------------------------------    Assessment / Plan:   Principal Problem:   Hyperbilirubinemia Active Problems:   Hepatitis   Closed rib fracture   OSA (obstructive sleep apnea)   Essential hypertension, benign   GERD (gastroesophageal reflux disease)   Class 1 obesity due to excess calories with serious comorbidity and body mass index (BMI) of 33.0 to 33.9 in adult   Secondary syphilis in male   Principal Problem:   Hyperbilirubinemia -jaundice Unknown etiology at this time, pending hepatitis panel,  CT abdomen pelvis -reviewed: Revealing Mildly lobular hepatic surface, suggestive of cirrhosis. Splenomegaly, could be an early manifestation of portal hypertension in the setting of cirrhosis.  Imaging studies including abdominal ultrasound - does not reveal any signs of obstruction at this time, therefore no need for MRCP, -Gastroenterology consult, appreciate their input -Pending abdominal ultrasound for further evaluation of biliary tree -Total bilirubin 12.5 >>  -Order indirect and direct bilirubin, will monitor closely -Continue IV fluid hydration  Hepatitis -pending hepatitis panel, elevated LFTs:  Alk phos 267, AST 720,   ALT 724, Lipase 60 -We will avoid hepatotoxic    Closed rib fracture -incentive spirometer, needed analgesics - Status post recent fall, sustaining rib fractures -CT scan reviewed -revealing  left posterior 4th - 8th rib fractures.  Lumbar fracture CT abdomen pelvis revealing -Subacute appearing left L1-L3 transverse fractures As needed analgesics, PT OT evaluation  Active Problems:  GERD (gastroesophageal reflux disease)/esophagitis -once again apparent on CT scan, continue PPI -referral to GI for EGD  OSA (obstructive sleep apnea) -outpatient referral for sleep study, does not supply oxygen with CPAP at home   Essential hypertension, benign - 131/82, blood pressure stable not on any home medication at this time   Class 1 obesity due to excess calories with serious comorbidity and body mass index (BMI) of 33.0 to 33.9 in adult -consulted regarding weight loss  H/o  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 Level of care: Med-Surg   Family Communication:  none at bedside  (The above findings and plan of care has been discussed with patient in detail, the patient expressed understanding and agreement of above plan)  --------------------------------------------------------------------------------------------------------------------------------------------------  Disposition Plan: >3 days Status is: Inpatient  Remains inpatient appropriate because:Inpatient level of care appropriate due to severity of illness   Dispo: The patient is from: Home              Anticipated d/c is to: Home              Patient currently is not medically stable to d/c.   Difficult to place patient  No   ----------------------------------------------------------------------------------------------------------------------------------------------------  Time spent: > than  55  Min.   SIGNED: Deatra James, MD, FHM. [facility],  Pager (Please use [email or web address] to page to text)  If 7PM-7AM, please contact night-coverage [email or web address],  08/21/2020, 7:36 AM

## 2020-08-21 NOTE — Plan of Care (Signed)

## 2020-08-21 NOTE — Consult Note (Signed)
Referring Provider: No ref. provider found Primary Care Physician:  Etta GrandchildJones, Thomas L, MD Primary Gastroenterologist:  Dr. Jena Gaussourk  Reason for Consultation: Hepatitis  HPI: 55 year old gentleman came to the emergency room last night after noting his skin was yellow.  In the ED, evaluation revealed AST/ALT of 720/724, respectively alkaline phosphatase 267 total bilirubin 12.5 (direct 7.5/indirect 5.1). Acute hepatitis profile pending. Patient has continued to have significant left posterior rib pain after falling off a ladder several days ago. CT scanning demonstrated multiple left rib fractures/  lumbar compression fracture and a nodular liver and slightly enlarged spleen suspicious for chronic liver disease.  No focal liver lesion or biliary dilation. Patient denies any abdominal pain or fever.  He does have reflux symptoms on a regular basis without any treatment.  No dysphagia.  No prior upper GI tract evaluation.  Denies melena, rectal bleeding, constipation or diarrhea. Alcohol consumption amounts to (1)  24 ounce beer every 3 to 4 months.  No illicit drugs.   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 No herbal over-the-counter remedies or vitamin supplements. No family history of chronic liver disease.  No parenteral drug use.  He has 1 tattoo which she received many years ago. Sexual contacts as outlined in PCPs note Patient was being set up for his first ever screening colonoscopy last year but it was not done apparently because of lack of healthcare insurance.   Denies prior history of yellow jaundice or liver disease. Past Medical History:  Diagnosis Date  . DDD (degenerative disc disease), lumbar   . Depression   . GERD (gastroesophageal reflux disease)     History reviewed. No pertinent surgical history.  Prior to Admission medications   Medication Sig Start Date End Date Taking? Authorizing Provider  ibuprofen (ADVIL) 200 MG tablet Take 800 mg by  mouth every 6 (six) hours as needed for headache or mild pain.   Yes [provider]  venlafaxine XR (EFFEXOR-XR) 150 MG 24 hr capsule TAKE 1 CAPSULE BY MOUTH DAILY WITH BREAKFAST. Patient taking differently: Take 150 mg by mouth daily with breakfast. 04/08/20  Yes Etta GrandchildJones, Thomas L, MD  Cholecalciferol 1.25 MG (50000 UT) capsule Take 1 capsule (50,000 Units total) by mouth once a week. Patient not taking: Reported on 08/21/2020 05/19/20   Etta GrandchildJones, Thomas L, MD    Current Facility-Administered Medications  Medication Dose Route Frequency Provider Last Rate Last Admin  . 0.9 %  sodium chloride infusion   Intravenous Continuous Nevin BloodgoodShahmehdi, Seyed A, MD 125 mL/hr at 08/21/20 0803 1,000 mL at 08/21/20 0803  . 0.9 %  sodium chloride infusion  250 mL Intravenous PRN Shahmehdi, Seyed A, MD      . alum & mag hydroxide-simeth (MAALOX/MYLANTA) 200-200-20 MG/5ML suspension 30 mL  30 mL Oral Q4H PRN Shahmehdi, Seyed A, MD      . bisacodyl (DULCOLAX) EC tablet 5 mg  5 mg Oral Daily PRN Shahmehdi, Seyed A, MD      . heparin injection 5,000 Units  5,000 Units Subcutaneous Q8H Nevin BloodgoodShahmehdi, Seyed A, MD   5,000 Units at 08/21/20 0806  . hydrALAZINE (APRESOLINE) injection 10 mg  10 mg Intravenous Q4H PRN Shahmehdi, Seyed A, MD      . HYDROmorphone (DILAUDID) injection 1 mg  1 mg Intravenous Q4H PRN Bobette Mortiz, David Manuel, MD   1 mg at 08/21/20 0901  . ibuprofen (ADVIL) tablet 800 mg  800 mg Oral Q6H PRN Kendell BaneShahmehdi, Seyed A, MD      .  ipratropium (ATROVENT) nebulizer solution 0.5 mg  0.5 mg Nebulization Q6H PRN Shahmehdi, Seyed A, MD      . levalbuterol (XOPENEX) nebulizer solution 0.63 mg  0.63 mg Nebulization Q6H PRN Shahmehdi, Seyed A, MD      . magnesium citrate solution 1 Bottle  1 Bottle Oral Once PRN Shahmehdi, Seyed A, MD      . methocarbamol (ROBAXIN) 500 mg in dextrose 5 % 50 mL IVPB  500 mg Intravenous Q6H PRN Shahmehdi, Seyed A, MD      . multivitamin with minerals tablet 1 tablet  1 tablet Oral Daily  Shahmehdi, Seyed A, MD      . ondansetron (ZOFRAN) injection 4 mg  4 mg Intravenous Q6H PRN Bobette Mo, MD      . oxyCODONE (Oxy IR/ROXICODONE) immediate release tablet 5 mg  5 mg Oral Q4H PRN Shahmehdi, Seyed A, MD      . senna (SENOKOT) tablet 8.6 mg  1 tablet Oral BID Shahmehdi, Seyed A, MD      . senna-docusate (Senokot-S) tablet 1 tablet  1 tablet Oral QHS PRN Shahmehdi, Seyed A, MD      . sodium chloride flush (NS) 0.9 % injection 3 mL  3 mL Intravenous Q12H Shahmehdi, Seyed A, MD      . sodium chloride flush (NS) 0.9 % injection 3 mL  3 mL Intravenous Q12H Shahmehdi, Seyed A, MD   3 mL at 08/21/20 0902  . sodium chloride flush (NS) 0.9 % injection 3 mL  3 mL Intravenous PRN Shahmehdi, Seyed A, MD      . traZODone (DESYREL) tablet 25 mg  25 mg Oral QHS PRN Shahmehdi, Seyed A, MD      . venlafaxine XR (EFFEXOR-XR) 24 hr capsule 150 mg  150 mg Oral Q breakfast Shahmehdi, Seyed A, MD   150 mg at 08/21/20 0804    Allergies as of 08/21/2020  . (No Known Allergies)    Family History  Problem Relation Age of Onset  . Arthritis Mother   . Hyperlipidemia Mother   . Stroke Mother   . Hypertension Mother   . Kidney disease Mother   . Diabetes Mother   . Alcohol abuse Father   . Hypertension Sister   . Cancer Neg Hx   . Early death Neg Hx   . Heart disease Brother   . Hypertension Brother   . Stroke Brother     Social History   Socioeconomic History  . Marital status: Married    Spouse name: Not on file  . Number of children: 5  . Years of education: 10  . Highest education level: Not on file  Occupational History    Employer: [employer]  Tobacco Use  . Smoking status: Former Smoker    Packs/day: 1.00    Years: 20.00    Pack years: 20.00    Types: Cigarettes    Quit date: 09/21/1998    Years since quitting: 21.9  . Smokeless tobacco: Never Used  Substance and Sexual Activity  . Alcohol use: Yes    Alcohol/week: 10.0 standard drinks    Types: 10 Cans of beer per  week  . Drug use: No  . Sexual activity: Yes  Other Topics Concern  . Not on file  Social History Narrative   Caffienated beverages- Yes   Seat belts use-Yes   Smoke Alarm in home-yes   Guns / fireman- No   Physical abuse- No   Social Determinants of Health  Financial Resource Strain: Not on file  Food Insecurity: Not on file  Transportation Needs: Not on file  Physical Activity: Not on file  Stress: Not on file  Social Connections: Not on file  Intimate Partner Violence: Not on file    Review of Systems:  As in history of present illness.  Physical Exam: Vital signs in last 24 hours: Temp:  [97.9 F (36.6 C)-98.6 F (37 C)] 97.9 F (36.6 C) (03/19 0854) Pulse Rate:  [80-101] 80 (03/19 0854) Resp:  [16-24] 16 (03/19 0854) BP: (119-139)/(62-95) 139/89 (03/19 0854) SpO2:  [96 %-100 %] 100 % (03/19 0854) Weight:  [101.2 kg-106.6 kg] 101.2 kg (03/19 0854)   General:   Jaundiced.  Alert,  Well-developed, well-nourished, pleasant and cooperative in NAD Head:  Normocephalic and atraumatic. Eyes: Icteric sclera .   Conjunctiva pink. Lungs:  Clear throughout to auscultation.   No wheezes, crackles, or rhonchi. No acute distress. Heart:  Regular rate and rhythm; no murmurs, clicks, rubs,  or gallops. Abdomen: Nondistended.  Positive bowel sounds soft nontender without appreciable mass organomegaly Rectal:  Deferred until time of colonoscopy.   Msk:  Symmetrical without gross deformities. Normal posture. Pulses:  Normal pulses noted. Extremities: Tattoo right arm  Intake/Output from previous day: No intake/output data recorded. Intake/Output this shift: No intake/output data recorded.  Lab Results: Recent Labs    08/21/20 0317  WBC 6.1  HGB 11.9*  HCT 31.0*  PLT 242   BMET Recent Labs    08/21/20 0317  NA 133*  K 3.9  CL 100  CO2 25  GLUCOSE 111*  BUN 9  CREATININE 0.72  CALCIUM 8.8*   LFT Recent Labs    08/21/20 0317  PROT 7.0  ALBUMIN 3.0*   AST 720*  ALT 724*  ALKPHOS 267*  BILITOT 12.6*  12.5*  BILIDIR 7.5*  IBILI 5.1*   PT/INR Recent Labs    08/21/20 0317  LABPROT 13.8  INR 1.1   Hepatitis Panel No results for input(s): HEPBSAG, HCVAB, HEPAIGM, HEPBIGM in the last 72 hours. C-Diff No results for input(s): CDIFFTOX in the last 72 hours.  Studies/Results: DG Ribs Unilateral W/Chest Left  Result Date: 08/21/2020 CLINICAL DATA:  55 year old male with fall and left chest wall pain. EXAM: LEFT RIBS AND CHEST - 3+ VIEW COMPARISON:  None. FINDINGS: There multiple mildly displaced left posterior rib fractures involving the fourth-eighth ribs. No focal consolidation, pleural effusion, pneumothorax. The cardiac silhouette is within limits. IMPRESSION: Multiple mildly displaced left posterior rib fractures. No pneumothorax. Electronically Signed   By: Elgie Collard M.D.   On: 08/21/2020 02:29   DG Thoracic Spine 2 View  Result Date: 08/21/2020 CLINICAL DATA:  55 year old male with fall. EXAM: THORACIC SPINE 2 VIEWS COMPARISON:  Chest radiograph dated 08/21/2020. FINDINGS: There is no acute fracture or subluxation of the thoracic spine. Multilevel degenerative changes and mild chronic compression fractures. Partially visualized multiple left posterior rib fractures. The soft tissues are unremarkable. IMPRESSION: 1. No acute fracture or subluxation of the thoracic spine. 2. Left posterior rib fractures. Electronically Signed   By: Elgie Collard M.D.   On: 08/21/2020 02:31   CT Chest W Contrast  Result Date: 08/21/2020 CLINICAL DATA:  Fall from ladder 3 weeks ago, persistent back pain. EXAM: CT CHEST, ABDOMEN, AND PELVIS WITH CONTRAST TECHNIQUE: Multidetector CT imaging of the chest, abdomen and pelvis was performed following the standard protocol during bolus administration of intravenous contrast. CONTRAST:  OMNIPAQUE IOHEXOL 300 MG/ML  SOLN COMPARISON:  Radiographs 08/21/2020 FINDINGS: CT CHEST FINDINGS  Cardiovascular: The aortic root is suboptimally assessed given cardiac pulsation artifact. The aorta is normal caliber. No acute luminal abnormality of the imaged aorta. No periaortic stranding or hemorrhage. Normal 3 vessel branching of the aortic arch. Proximal great vessels are unremarkable. Normal heart size. No pericardial effusion. Central pulmonary arteries are normal caliber no large central filling defects with more distal evaluation limited by non tailored examination. No major venous abnormalities are seen. Mediastinum/Nodes: No mediastinal fluid or gas. Normal thyroid gland and thoracic inlet. No acute abnormality of the trachea or esophagus. No worrisome mediastinal, hilar or axillary adenopathy. Lungs/Pleura: No acute traumatic abnormality of the lung parenchyma. Thickening adjacent the contiguous posterior left-sided rib fractures appears to be largely subpleural without layering effusion or pneumothorax. Some minimal adjacent ground-glass could be atelectatic or reflective atelectasis versus resolving pulmonary contusive change. Lungs are otherwise clear. No consolidative process or edema. Musculoskeletal: Subacute appearing left posterior fourth through eighth rib fractures. Adjacent thickening appears largely subpleural, as above. No right-sided rib fractures. No other acute traumatic osseous injuries of the chest wall or imaged thoracic spine. Mild dextrocurvature of the mid to upper thoracic levels. CT ABDOMEN PELVIS FINDINGS Hepatobiliary: No direct hepatic injury or perihepatic hematoma. Solitary subcentimeter hypoattenuating focus in the right lobe liver (8/16), statistically likely benign. No worrisome focal liver lesions. Mildly lobular hepatic surface contour. Normal hepatic attenuation. Normal gallbladder and biliary tree. No significant biliary ductal dilatation or visible gallstones. Pancreas: No pancreatic ductal dilatation or surrounding inflammatory changes. Spleen: Splenomegaly. No  focal splenic lesions. No direct splenic injury or perisplenic hematoma. Adrenals/Urinary Tract: Normal adrenal glands. Kidneys are normally located with symmetric enhancementand excretion. Few subcentimeter hypoattenuating foci are too small to fully characterize. No suspicious renal lesion, urolithiasis or hydronephrosis. Traumatic or acute bladder abnormality is seen. Stomach/Bowel: Distal esophagus, stomach are unremarkable. Minimal stranding adjacent the first and second portion of the duodenum (2/65) without significant wall thickening, extraluminal gas or free fluid. No small bowel wall thickening or dilatation. No evidence of obstruction. A normal appendix is visualized. No colonic dilatation or wall thickening. Vascular/Lymphatic: No significant vascular findings are present. No enlarged abdominal or pelvic lymph nodes. Reproductive: The prostate and seminal vesicles are unremarkable. No acute abnormality of the external genitalia. Other: No abdominopelvic free fluid or free gas. No bowel containing hernias. Musculoskeletal: No acute osseous abnormality or suspicious osseous lesion. Subacute appearing left L1-L3 transverse process fractures. 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 Levocurvature of the lumbar spine, apex L4-5. Multilevel degenerative changes are present in the imaged portions of the spine. Bones of the pelvis are intact and congruent. Proximal femora are intact and normally located. IMPRESSION: 1. Subacute appearing left posterior fourth through eighth rib fractures. Adjacent thickening appears to be largely subpleural without layering effusion or pneumothorax. Some minimal adjacent ground-glass could be atelectatic related to splinting versus resolving pulmonary contusive change. 2. Subacute appearing left L1-L3 transverse process fractures. 3. Minimal stranding adjacent the first and second portion of the duodenum without  significant wall thickening, extraluminal gas or free fluid. Findings could reflect a mild duodenitis or peptic ulcer disease in the appropriate clinical setting 4. Mildly lobular hepatic surface, suggestive of cirrhosis. Splenomegaly, could be an early manifestation of portal hypertension in the setting of cirrhosis. 5. 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 Anatomic variant. These results were called by telephone at the time of  interpretation on 08/21/2020 at 6:10 am to provider [facility] , who verbally acknowledged these results. Electronically Signed   By: Kreg Shropshire M.D.   On: 08/21/2020 06:10   CT ABDOMEN PELVIS W CONTRAST  Result Date: 08/21/2020 CLINICAL DATA:  Fall from ladder 3 weeks ago, persistent back pain. EXAM: CT CHEST, ABDOMEN, AND PELVIS WITH CONTRAST TECHNIQUE: Multidetector CT imaging of the chest, abdomen and pelvis was performed following the standard protocol during bolus administration of intravenous contrast. CONTRAST:  OMNIPAQUE IOHEXOL 300 MG/ML  SOLN COMPARISON:  Radiographs 08/21/2020 FINDINGS: CT CHEST FINDINGS Cardiovascular: The aortic root is suboptimally assessed given cardiac pulsation artifact. The aorta is normal caliber. No acute luminal abnormality of the imaged aorta. No periaortic stranding or hemorrhage. Normal 3 vessel branching of the aortic arch. Proximal great vessels are unremarkable. Normal heart size. No pericardial effusion. Central pulmonary arteries are normal caliber no large central filling defects with more distal evaluation limited by non tailored examination. No major venous abnormalities are seen. Mediastinum/Nodes: No mediastinal fluid or gas. Normal thyroid gland and thoracic inlet. No acute abnormality of the trachea or esophagus. No worrisome mediastinal, hilar or axillary adenopathy. Lungs/Pleura: No acute traumatic abnormality of the lung parenchyma. Thickening adjacent the  contiguous posterior left-sided rib fractures appears to be largely subpleural without layering effusion or pneumothorax. Some minimal adjacent ground-glass could be atelectatic or reflective atelectasis versus resolving pulmonary contusive change. Lungs are otherwise clear. No consolidative process or edema. Musculoskeletal: Subacute appearing left posterior fourth through eighth rib fractures. Adjacent thickening appears largely subpleural, as above. No right-sided rib fractures. No other acute traumatic osseous injuries of the chest wall or imaged thoracic spine. Mild dextrocurvature of the mid to upper thoracic levels. CT ABDOMEN PELVIS FINDINGS Hepatobiliary: No direct hepatic injury or perihepatic hematoma. Solitary subcentimeter hypoattenuating focus in the right lobe liver (8/16), statistically likely benign. No worrisome focal liver lesions. Mildly lobular hepatic surface contour. Normal hepatic attenuation. Normal gallbladder and biliary tree. No significant biliary ductal dilatation or visible gallstones. Pancreas: No pancreatic ductal dilatation or surrounding inflammatory changes. Spleen: Splenomegaly. No focal splenic lesions. No direct splenic injury or perisplenic hematoma. Adrenals/Urinary Tract: Normal adrenal glands. Kidneys are normally located with symmetric enhancementand excretion. Few subcentimeter hypoattenuating foci are too small to fully characterize. No suspicious renal lesion, urolithiasis or hydronephrosis. Traumatic or acute bladder abnormality is seen. Stomach/Bowel: Distal esophagus, stomach are unremarkable. Minimal stranding adjacent the first and second portion of the duodenum (2/65) without significant wall thickening, extraluminal gas or free fluid. No small bowel wall thickening or dilatation. No evidence of obstruction. A normal appendix is visualized. No colonic dilatation or wall thickening. Vascular/Lymphatic: No significant vascular findings are present. No enlarged  abdominal or pelvic lymph nodes. Reproductive: The prostate and seminal vesicles are unremarkable. No acute abnormality of the external genitalia. Other: No abdominopelvic free fluid or free gas. No bowel containing hernias. Musculoskeletal: No acute osseous abnormality or suspicious osseous lesion. Subacute appearing left L1-L3 transverse process fractures. 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 Levocurvature of the lumbar spine, apex L4-5. Multilevel degenerative changes are present in the imaged portions of the spine. Bones of the pelvis are intact and congruent. Proximal femora are intact and normally located. IMPRESSION: 1. Subacute appearing left posterior fourth through eighth rib fractures. Adjacent thickening appears to be largely subpleural without layering effusion or pneumothorax. Some minimal adjacent ground-glass could be atelectatic related to splinting versus resolving pulmonary contusive change.  2. Subacute appearing left L1-L3 transverse process fractures. 3. Minimal stranding adjacent the first and second portion of the duodenum without significant wall thickening, extraluminal gas or free fluid. Findings could reflect a mild duodenitis or peptic ulcer disease in the appropriate clinical setting 4. Mildly lobular hepatic surface, suggestive of cirrhosis. Splenomegaly, could be an early manifestation of portal hypertension in the setting of cirrhosis. 5. 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 Anatomic variant. These results were called by telephone at the time of interpretation on 08/21/2020 at 6:10 am to provider [facility] , who verbally acknowledged these results. Electronically Signed   By: Kreg Shropshire M.D.   On: 08/21/2020 06:10   Impression: 55 year old gentleman admitted to the hospital with painless jaundice. Biochemical profile suggest an acute  hepatitis. Recent blunt force trauma resulting in multiple rib fractures a separate issue as is secondary syphilis.  I suspect the patient has acute viral hepatitis with potential drug-induced hepatotoxicity/idiosyncratic drug reaction, autoimmune hepatitis remaining in the differential but being much less likely.  Morphologic appearance of the liver and spleen suggestive of cirrhosis -which is also likely a separate issue from his acute presentation.  Nonalcoholic fatty liver disease at baseline likely although other entities possible.  Poorly controlled GERD  Patient in need of colorectal cancer screening.  Recommendations: For now follow-up on hepatitis panel prior to embarking on a major serological evaluation for other causes of liver disease.  Once daily PPI.  Avoid potentially hepatotoxic medications.  Repeat hepatic function profile in the morning.  He will benefit from a screening EGD and colonoscopy as an outpatient.  If patient remains stable along with his LFTs over the next 24 hours, we may be able to continue the work-up as an outpatient.  Further recommendations to follow.       Notice:  This dictation was prepared with Dragon dictation along with smaller phrase technology. Any transcriptional errors that result from this process are unintentional and may not be corrected upon review.

## 2020-08-21 NOTE — ED Provider Notes (Addendum)
[facility] EMERGENCY DEPARTMENT Provider Note   CSN: [id number] Arrival date & time: 08/21/20  0035     History Chief Complaint  Patient presents with  . Fall    Brandon Lane is a 55 y.o. male.  Patient with a history of GERD, degenerative disc disease in his lumbar spine presenting with pain in his left upper back and ribs since falling off a ladder approximately 4 weeks ago.  States he was on a 6 foot ladder and fell straight backwards approximately 4 to 5 feet onto his back.  Did not hit his head or lose consciousness.  Since then has been having some left upper back pain that is worse with movement and deep breathing.  He has been taking Tylenol and ibuprofen at home without relief.  Denies cough or fever.  Denies chest pain.  Denies any weakness in his arms or legs.  Denies any bowel or bladder incontinence.  No difficulty speaking or difficulty swallowing. He came to evaluated today at the request of his family who thought he was becoming more jaundiced and yellow in his eyes as well as the skin.  This is been ongoing for several days.  He denies any abdominal pain or vomiting. States he does not drink excessively or take excessive Tylenol. He has been only taking acetaminophen 2 or 3 times daily.   The history is provided by the patient.  Fall Associated symptoms include shortness of breath. Pertinent negatives include no chest pain, no abdominal pain and no headaches.       Past Medical History:  Diagnosis Date  . DDD (degenerative disc disease), lumbar   . Depression   . GERD (gastroesophageal reflux disease)     Patient Active Problem List   Diagnosis Date Noted  . 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 Encounter for general adult medical examination with abnormal findings 05/19/2020  . Need for hepatitis C screening test 05/19/2020  . Penile lesion 05/19/2020  . Rash 05/19/2020  . Hyperglycemia 05/19/2020  . Deficiency anemia 05/19/2020  . Mass of perianal  area 05/12/2020  . Chronic fatigue 06/02/2019  . Vitamin D deficiency disease 08/23/2018  . Class 1 obesity due to excess calories with serious comorbidity and body mass index (BMI) of 33.0 to 33.9 in adult 08/22/2018  . Colon cancer screening 08/22/2018  . Major psychotic depression, recurrent (HCC) 08/22/2018  . Skin lesion of scalp 12/21/2016  . Idiopathic gout 04/24/2016  . GERD (gastroesophageal reflux disease) 02/22/2015  . Eczema, allergic 02/22/2015  . Allergic rhinitis due to pollen 02/22/2015  . Spinal stenosis of lumbar region 02/02/2014  . Essential hypertension, benign 01/03/2013  . Obesity (BMI 30-39.9) 12/27/2012  . OSA (obstructive sleep apnea) 10/25/2012  . DDD (degenerative disc disease), lumbar 12/16/2010  . Pure hyperglyceridemia 10/24/2010  . ED (erectile dysfunction) of non-organic origin 10/24/2010    History reviewed. No pertinent surgical history.     Family History  Problem Relation Age of Onset  . Arthritis Mother   . Hyperlipidemia Mother   . Stroke Mother   . Hypertension Mother   . Kidney disease Mother   . Diabetes Mother   . Alcohol abuse Father   . Hypertension Sister   . Cancer Neg Hx   . Early death Neg Hx   . Heart disease Brother   . Hypertension Brother   . Stroke Brother     Social History   Tobacco Use  . Smoking status: Former Smoker    Packs/day:  1.00    Years: 20.00    Pack years: 20.00    Types: Cigarettes    Quit date: 09/21/1998    Years since quitting: 21.9  . Smokeless tobacco: Never Used  Substance Use Topics  . Alcohol use: Yes    Alcohol/week: 10.0 standard drinks    Types: 10 Cans of beer per week  . Drug use: No    Home Medications Prior to Admission medications   Medication Sig Start Date End Date Taking? Authorizing Provider  Cholecalciferol 1.25 MG (50000 UT) capsule Take 1 capsule (50,000 Units total) by mouth once a week. 05/19/20   Etta Grandchild, MD  venlafaxine XR (EFFEXOR-XR) 150 MG 24 hr  capsule TAKE 1 CAPSULE BY MOUTH DAILY WITH BREAKFAST. 04/08/20   Etta Grandchild, MD    Allergies    Patient has no known allergies.  Review of Systems   Review of Systems  Constitutional: Negative for activity change, appetite change and fever.  HENT: Negative for congestion and rhinorrhea.   Respiratory: Positive for shortness of breath. Negative for cough.   Cardiovascular: Negative for chest pain.  Gastrointestinal: Negative for abdominal pain, nausea and vomiting.  Genitourinary: Negative for dysuria.  Musculoskeletal: Positive for back pain. Negative for arthralgias and myalgias.  Skin: Positive for color change and rash.  Neurological: Negative for weakness and headaches.   all other systems are negative except as noted in the HPI and PMH.    Physical Exam Updated Vital Signs BP 138/87 (BP Location: Left Arm)   Pulse (!) 101   Temp 98.6 F (37 C) (Oral)   Resp 18   Ht  (1.753 m)   Wt 106.6 kg   SpO2 100%   BMI 34.70 kg/m   Physical Exam Vitals and nursing note reviewed.  Constitutional:      General: He is not in acute distress.    Appearance: He is well-developed.     Comments:  Jaundice sclera, yellow discoloration to skin  HENT:     Head: Normocephalic.     Comments: Purple discoloration to the scalp which patient states is stable and a birthmark    Mouth/Throat:     Pharynx: No oropharyngeal exudate.  Eyes:     General: Scleral icterus present.     Conjunctiva/sclera: Conjunctivae normal.     Pupils: Pupils are equal, round, and reactive to light.  Neck:     Comments: No midline C-spine tenderness Cardiovascular:     Rate and Rhythm: Normal rate and regular rhythm.     Heart sounds: Normal heart sounds. No murmur heard.   Pulmonary:     Effort: Pulmonary effort is normal. No respiratory distress.     Breath sounds: Normal breath sounds.       Comments: TTP to palpation.  No ecchymosis or crepitance. Chest:     Chest wall: Tenderness  present.  Abdominal:     Palpations: Abdomen is soft.     Tenderness: There is no abdominal tenderness. There is no guarding or rebound.  Musculoskeletal:        General: Tenderness present. Normal range of motion.     Cervical back: Normal range of motion and neck supple.     Comments:  Paraspinal thoracic tenderness on the left.  Posterior rib tenderness.  No midline T or L-spine tenderness  5/5 strength in bilateral lower extremities. Ankle plantar and dorsiflexion intact. Great toe extension intact bilaterally. +2 DP and PT pulses. +2 patellar reflexes bilaterally. Normal  gait.   Skin:    General: Skin is warm.  Neurological:     Mental Status: He is alert and oriented to person, place, and time.     Cranial Nerves: No cranial nerve deficit.     Motor: No abnormal muscle tone.     Coordination: Coordination normal.     Comments: No ataxia on finger to nose bilaterally. No pronator drift. 5/5 strength throughout. CN 2-12 intact.Equal grip strength. Sensation intact.   Psychiatric:        Behavior: Behavior normal.     ED Results / Procedures / Treatments   Labs (all labs ordered are listed, but only abnormal results are displayed) Labs Reviewed  CBC WITH DIFFERENTIAL/PLATELET - Abnormal; Notable for the following components:      Result Value   RBC 2.82 (*)    Hemoglobin 11.9 (*)    HCT 31.0 (*)    MCV 109.9 (*)    MCH 42.2 (*)    MCHC 38.4 (*)    RDW 21.0 (*)    All other components within normal limits  COMPREHENSIVE METABOLIC PANEL - Abnormal; Notable for the following components:   Sodium 133 (*)    Glucose, Bld 111 (*)    Calcium 8.8 (*)    Albumin 3.0 (*)    AST 720 (*)    ALT 724 (*)    Alkaline Phosphatase 267 (*)    Total Bilirubin 12.5 (*)    All other components within normal limits  LIPASE, BLOOD - Abnormal; Notable for the following components:   Lipase 60 (*)    All other components within normal limits  ACETAMINOPHEN LEVEL - Abnormal; Notable for  the following components:   Acetaminophen (Tylenol), Serum <10 (*)    All other components within normal limits  RESP PANEL BY RT-PCR (FLU A&B, COVID) ARPGX2  PROTIME-INR  CBC WITH DIFFERENTIAL/PLATELET  URINALYSIS, ROUTINE W REFLEX MICROSCOPIC  HEPATITIS PANEL, ACUTE  HIV ANTIBODY (ROUTINE TESTING W REFLEX)    EKG EKG Interpretation  Date/Time:  Saturday August 21 2020 01:33:17 EDT Ventricular Rate:  87 PR Interval:    QRS Duration: 89 QT Interval:  353 QTC Calculation: 425 R Axis:   40 Text Interpretation: Sinus rhythm No previous ECGs available Confirmed by Glynn Octaveancour, Rocio Wolak [phone] on 08/21/2020 1:56:02 AM   Radiology DG Ribs Unilateral W/Chest Left  Result Date: 08/21/2020 CLINICAL DATA:  55 year old male with fall and left chest wall pain. EXAM: LEFT RIBS AND CHEST - 3+ VIEW COMPARISON:  None. FINDINGS: There multiple mildly displaced left posterior rib fractures involving the fourth-eighth ribs. No focal consolidation, pleural effusion, pneumothorax. The cardiac silhouette is within limits. IMPRESSION: Multiple mildly displaced left posterior rib fractures. No pneumothorax. Electronically Signed   By: Elgie CollardArash  Radparvar M.D.   On: 08/21/2020 02:29   DG Thoracic Spine 2 View  Result Date: 08/21/2020 CLINICAL DATA:  55 year old male with fall. EXAM: THORACIC SPINE 2 VIEWS COMPARISON:  Chest radiograph dated 08/21/2020. FINDINGS: There is no acute fracture or subluxation of the thoracic spine. Multilevel degenerative changes and mild chronic compression fractures. Partially visualized multiple left posterior rib fractures. The soft tissues are unremarkable. IMPRESSION: 1. No acute fracture or subluxation of the thoracic spine. 2. Left posterior rib fractures. Electronically Signed   By: Elgie CollardArash  Radparvar M.D.   On: 08/21/2020 02:31   CT Chest W Contrast  Result Date: 08/21/2020 CLINICAL DATA:  Fall from ladder 3 weeks ago, persistent back pain. EXAM: CT CHEST, ABDOMEN, AND PELVIS  WITH CONTRAST TECHNIQUE: Multidetector CT imaging of the chest, abdomen and pelvis was performed following the standard protocol during bolus administration of intravenous contrast. CONTRAST:  OMNIPAQUE IOHEXOL 300 MG/ML  SOLN COMPARISON:  Radiographs 08/21/2020 FINDINGS: CT CHEST FINDINGS Cardiovascular: The aortic root is suboptimally assessed given cardiac pulsation artifact. The aorta is normal caliber. No acute luminal abnormality of the imaged aorta. No periaortic stranding or hemorrhage. Normal 3 vessel branching of the aortic arch. Proximal great vessels are unremarkable. Normal heart size. No pericardial effusion. Central pulmonary arteries are normal caliber no large central filling defects with more distal evaluation limited by non tailored examination. No major venous abnormalities are seen. Mediastinum/Nodes: No mediastinal fluid or gas. Normal thyroid gland and thoracic inlet. No acute abnormality of the trachea or esophagus. No worrisome mediastinal, hilar or axillary adenopathy. Lungs/Pleura: No acute traumatic abnormality of the lung parenchyma. Thickening adjacent the contiguous posterior left-sided rib fractures appears to be largely subpleural without layering effusion or pneumothorax. Some minimal adjacent ground-glass could be atelectatic or reflective atelectasis versus resolving pulmonary contusive change. Lungs are otherwise clear. No consolidative process or edema. Musculoskeletal: Subacute appearing left posterior fourth through eighth rib fractures. Adjacent thickening appears largely subpleural, as above. No right-sided rib fractures. No other acute traumatic osseous injuries of the chest wall or imaged thoracic spine. Mild dextrocurvature of the mid to upper thoracic levels. CT ABDOMEN PELVIS FINDINGS Hepatobiliary: No direct hepatic injury or perihepatic hematoma. Solitary subcentimeter hypoattenuating focus in the right lobe liver (8/16), statistically likely benign. No  worrisome focal liver lesions. Mildly lobular hepatic surface contour. Normal hepatic attenuation. Normal gallbladder and biliary tree. No significant biliary ductal dilatation or visible gallstones. Pancreas: No pancreatic ductal dilatation or surrounding inflammatory changes. Spleen: Splenomegaly. No focal splenic lesions. No direct splenic injury or perisplenic hematoma. Adrenals/Urinary Tract: Normal adrenal glands. Kidneys are normally located with symmetric enhancementand excretion. Few subcentimeter hypoattenuating foci are too small to fully characterize. No suspicious renal lesion, urolithiasis or hydronephrosis. Traumatic or acute bladder abnormality is seen. Stomach/Bowel: Distal esophagus, stomach are unremarkable. Minimal stranding adjacent the first and second portion of the duodenum (2/65) without significant wall thickening, extraluminal gas or free fluid. No small bowel wall thickening or dilatation. No evidence of obstruction. A normal appendix is visualized. No colonic dilatation or wall thickening. Vascular/Lymphatic: No significant vascular findings are present. No enlarged abdominal or pelvic lymph nodes. Reproductive: The prostate and seminal vesicles are unremarkable. No acute abnormality of the external genitalia. Other: No abdominopelvic free fluid or free gas. No bowel containing hernias. Musculoskeletal: No acute osseous abnormality or suspicious osseous lesion. Subacute appearing left L1-L3 transverse process fractures. 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 Levocurvature of the lumbar spine, apex L4-5. Multilevel degenerative changes are present in the imaged portions of the spine. Bones of the pelvis are intact and congruent. Proximal femora are intact and normally located. IMPRESSION: 1. Subacute appearing left posterior fourth through eighth rib fractures. Adjacent thickening appears to be largely subpleural without  layering effusion or pneumothorax. Some minimal adjacent ground-glass could be atelectatic related to splinting versus resolving pulmonary contusive change. 2. Subacute appearing left L1-L3 transverse process fractures. 3. Minimal stranding adjacent the first and second portion of the duodenum without significant wall thickening, extraluminal gas or free fluid. Findings could reflect a mild duodenitis or peptic ulcer disease in the appropriate clinical setting 4. Mildly lobular hepatic surface, suggestive of cirrhosis. Splenomegaly, could be an early manifestation of  portal hypertension in the setting of cirrhosis. 5. 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 Anatomic variant. These results were called by telephone at the time of interpretation on 08/21/2020 at 6:10 am to provider [facility] , who verbally acknowledged these results. Electronically Signed   By: Kreg Shropshire M.D.   On: 08/21/2020 06:10   CT ABDOMEN PELVIS W CONTRAST  Result Date: 08/21/2020 CLINICAL DATA:  Fall from ladder 3 weeks ago, persistent back pain. EXAM: CT CHEST, ABDOMEN, AND PELVIS WITH CONTRAST TECHNIQUE: Multidetector CT imaging of the chest, abdomen and pelvis was performed following the standard protocol during bolus administration of intravenous contrast. CONTRAST:  OMNIPAQUE IOHEXOL 300 MG/ML  SOLN COMPARISON:  Radiographs 08/21/2020 FINDINGS: CT CHEST FINDINGS Cardiovascular: The aortic root is suboptimally assessed given cardiac pulsation artifact. The aorta is normal caliber. No acute luminal abnormality of the imaged aorta. No periaortic stranding or hemorrhage. Normal 3 vessel branching of the aortic arch. Proximal great vessels are unremarkable. Normal heart size. No pericardial effusion. Central pulmonary arteries are normal caliber no large central filling defects with more distal evaluation limited by non tailored examination. No major venous  abnormalities are seen. Mediastinum/Nodes: No mediastinal fluid or gas. Normal thyroid gland and thoracic inlet. No acute abnormality of the trachea or esophagus. No worrisome mediastinal, hilar or axillary adenopathy. Lungs/Pleura: No acute traumatic abnormality of the lung parenchyma. Thickening adjacent the contiguous posterior left-sided rib fractures appears to be largely subpleural without layering effusion or pneumothorax. Some minimal adjacent ground-glass could be atelectatic or reflective atelectasis versus resolving pulmonary contusive change. Lungs are otherwise clear. No consolidative process or edema. Musculoskeletal: Subacute appearing left posterior fourth through eighth rib fractures. Adjacent thickening appears largely subpleural, as above. No right-sided rib fractures. No other acute traumatic osseous injuries of the chest wall or imaged thoracic spine. Mild dextrocurvature of the mid to upper thoracic levels. CT ABDOMEN PELVIS FINDINGS Hepatobiliary: No direct hepatic injury or perihepatic hematoma. Solitary subcentimeter hypoattenuating focus in the right lobe liver (8/16), statistically likely benign. No worrisome focal liver lesions. Mildly lobular hepatic surface contour. Normal hepatic attenuation. Normal gallbladder and biliary tree. No significant biliary ductal dilatation or visible gallstones. Pancreas: No pancreatic ductal dilatation or surrounding inflammatory changes. Spleen: Splenomegaly. No focal splenic lesions. No direct splenic injury or perisplenic hematoma. Adrenals/Urinary Tract: Normal adrenal glands. Kidneys are normally located with symmetric enhancementand excretion. Few subcentimeter hypoattenuating foci are too small to fully characterize. No suspicious renal lesion, urolithiasis or hydronephrosis. Traumatic or acute bladder abnormality is seen. Stomach/Bowel: Distal esophagus, stomach are unremarkable. Minimal stranding adjacent the first and second portion of the  duodenum (2/65) without significant wall thickening, extraluminal gas or free fluid. No small bowel wall thickening or dilatation. No evidence of obstruction. A normal appendix is visualized. No colonic dilatation or wall thickening. Vascular/Lymphatic: No significant vascular findings are present. No enlarged abdominal or pelvic lymph nodes. Reproductive: The prostate and seminal vesicles are unremarkable. No acute abnormality of the external genitalia. Other: No abdominopelvic free fluid or free gas. No bowel containing hernias. Musculoskeletal: No acute osseous abnormality or suspicious osseous lesion. Subacute appearing left L1-L3 transverse process fractures. 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 Levocurvature of the lumbar spine, apex L4-5. Multilevel degenerative changes are present in the imaged portions of the spine. Bones of the pelvis are intact and congruent. Proximal femora are intact and normally located. IMPRESSION: 1. Subacute appearing  left posterior fourth through eighth rib fractures. Adjacent thickening appears to be largely subpleural without layering effusion or pneumothorax. Some minimal adjacent ground-glass could be atelectatic related to splinting versus resolving pulmonary contusive change. 2. Subacute appearing left L1-L3 transverse process fractures. 3. Minimal stranding adjacent the first and second portion of the duodenum without significant wall thickening, extraluminal gas or free fluid. Findings could reflect a mild duodenitis or peptic ulcer disease in the appropriate clinical setting 4. Mildly lobular hepatic surface, suggestive of cirrhosis. Splenomegaly, could be an early manifestation of portal hypertension in the setting of cirrhosis. 5. 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 Anatomic variant. These results were called by telephone at the time of  interpretation on 08/21/2020 at 6:10 am to provider [facility] , who verbally acknowledged these results. Electronically Signed   By: Kreg Shropshire M.D.   On: 08/21/2020 06:10    Procedures Procedures   Medications Ordered in ED Medications - No data to display  ED Course  I have reviewed the triage vital signs and the nursing notes.  Pertinent labs & imaging results that were available during my care of the patient were reviewed by me and considered in my medical decision making (see chart for details).    MDM Rules/Calculators/A&P                         Left upper back and rib pain after a fall a month ago.  No midline tenderness.  Neurovascularly intact. No head or neck injury.  Low suspicion for cord compression or cauda equina.  Patient found to be jaundiced with scleral icterus  X-ray shows multiple rib fractures without pneumothorax. EKG is sinus rhythm.  Troponin is negative.  Low suspicion for ACS.  LFTs confirmed to be elevated with bilirubin of 12.5.  Patient denies any abdominal pain.  He denies any acetaminophen use. Imaging will be obtained to rule out biliary obstruction and patient will need admission.  CT of abdomen will be obtained to rule out biliary obstruction.  Given patient's multiple rib fractures in setting of trauma we will image chest as well. He denies any acetaminophen use.  We will add hepatitis panel.  Patient was recently treated for syphilis.  Admission discussed with Dr. Robb Matar.  Patient may need transfer to [facility] if he needs a GI intervention this weekend such as MRCP.  CT results d/w Dr. Elvera Maria of radiology and Dr. Robb Matar.  No biliary obstruction seen.  There is mild inflammation of the duodenum and nodular liver. Subacute left-sided rib fractures and lumbar spine transverse process fractures.  No pneumothorax. Final Clinical Impression(s) / ED Diagnoses Final diagnoses:  Jaundice  Closed fracture of multiple ribs of left side, initial  encounter    Rx / DC Orders ED Discharge Orders    None       Guy Seese, Jeannett Senior, MD 08/21/20 [id number]    Glynn Octave, MD 08/21/20 [phone]

## 2020-08-21 NOTE — ED Notes (Signed)
Pt taken for CT 

## 2020-08-21 NOTE — ED Triage Notes (Addendum)
Pt fell off ladder 3 weeks ago. Was not seen for fall. States his pain in his back is no better and states he has increased pain with certain movements and taking deep breaths. Pt ambulatory to room. Pt states he has been taking ibuprofen and usuing a heating pad @ home without relief.

## 2020-08-22 LAB — CBC
HCT: 31.6 % — ABNORMAL LOW (ref 39.0–52.0)
Hemoglobin: 11.3 g/dL — ABNORMAL LOW (ref 13.0–17.0)
MCH: 38.8 pg — ABNORMAL HIGH (ref 26.0–34.0)
MCHC: 35.8 g/dL (ref 30.0–36.0)
MCV: 108.6 fL — ABNORMAL HIGH (ref 80.0–100.0)
Platelets: 194 10*3/uL (ref 150–400)
RBC: 2.91 MIL/uL — ABNORMAL LOW (ref 4.22–5.81)
RDW: 17.2 % — ABNORMAL HIGH (ref 11.5–15.5)
WBC: 6 10*3/uL (ref 4.0–10.5)
nRBC: 0 % (ref 0.0–0.2)

## 2020-08-22 LAB — COMPREHENSIVE METABOLIC PANEL
ALT: 687 U/L — ABNORMAL HIGH (ref 0–44)
AST: 738 U/L — ABNORMAL HIGH (ref 15–41)
Albumin: 2.7 g/dL — ABNORMAL LOW (ref 3.5–5.0)
Alkaline Phosphatase: 225 U/L — ABNORMAL HIGH (ref 38–126)
Anion gap: 5 (ref 5–15)
BUN: 8 mg/dL (ref 6–20)
CO2: 25 mmol/L (ref 22–32)
Calcium: 8.5 mg/dL — ABNORMAL LOW (ref 8.9–10.3)
Chloride: 102 mmol/L (ref 98–111)
Creatinine, Ser: 0.51 mg/dL — ABNORMAL LOW (ref 0.61–1.24)
GFR, Estimated: >60 mL/min (ref >60)
Glucose, Bld: 98 mg/dL (ref 70–99)
Potassium: 4.1 mmol/L (ref 3.5–5.1)
Sodium: 132 mmol/L — ABNORMAL LOW (ref 135–145)
Total Bilirubin: 14.6 mg/dL — ABNORMAL HIGH (ref 0.3–1.2)
Total Protein: 6.1 g/dL — ABNORMAL LOW (ref 6.5–8.1)

## 2020-08-22 LAB — GLUCOSE, CAPILLARY
Glucose-Capillary: 78 mg/dL (ref 70–99)
Glucose-Capillary: 164 mg/dL — ABNORMAL HIGH (ref 70–99)

## 2020-08-22 LAB — HEPATITIS PANEL, ACUTE
HCV Ab: NONREACTIVE
Hep A IgM: NONREACTIVE
Hep B C IgM: REACTIVE — AB
Hepatitis B Surface Ag: REACTIVE — AB

## 2020-08-22 LAB — PROTIME-INR
INR: 1.2 (ref 0.8–1.2)
Prothrombin Time: 14.6 seconds (ref 11.4–15.2)

## 2020-08-22 MED ORDER — LACTATED RINGERS IV SOLN: INTRAVENOUS | Status: DC

## 2020-08-22 NOTE — Plan of Care (Signed)

## 2020-08-22 NOTE — Progress Notes (Signed)
PROGRESS NOTE    Patient: Brandon Lane                            PCP: Janith Lima, MD                    DOB: 06/05/1966            DOA: 08/21/2020 ONG:[id number]             DOS: 08/22/2020, 11:01 AM   LOS: 1 day   Date of Service: The patient was seen and examined on 08/22/2020  Subjective:   The patient was seen and examined this morning. Still complaining of rib pain back pain with exertion Reporting his eyes and skin is more yellowish in color this morning Denies having any abdominal pain nausea or vomiting Otherwise no issues overnight .  Brief Narrative:   Treshun Wold is a 55 y.o. male with medical history significant of with past medical history of GERD, OSA, history of syphilis, hypertension, depression, DDD, status post recent fall from a ladder approximately 4 weeks ago, falling backwards hitting around 4 to 5 feet on his back presenting today with pain discomfort and yellowish of skin. Describes the fall as accidental, did not hit his head or loss consciousness.-Now experiencing left upper back, rib pain no radiation worse with movement and deep breathing. Admit that he has been taken ibuprofen and Tylenol without relief. Per patient and family he is more yellowish in skin, which has gotten worse and over past few days.  Patient Denies having: Fever, Chills, Cough, SOB, Chest Pain, Abd pain, N/V/D, headache, dizziness, lightheadedness,  Dysuria, Joint pain, rash, open wounds.. Denies of having any weakness in his his legs or arms.  ED Course:   Vitals: Medically stable, Abnormal labs; elevated LFTs AST 720, ALT 724, alk phos 267, total bilirubin 12.5, lipase 60, Tylenol level < 10 Chest x-ray multiple rib fractures negative for pneumothorax, EKG normal sinus rhythm, CT abdomen pelvis chest reviewed consistent with rib fractures, subacute lower lumbar fractures, CT did not reveal any obstruction-inflammation of the duodenum and liver nodules identified, subacute  left-sided rib fractures and lumbar spine transverse process fractures    Assessment & Plan:   Principal Problem:   Hyperbilirubinemia Active Problems:   Hepatitis   Closed rib fracture   OSA (obstructive sleep apnea)   Essential hypertension, benign   GERD (gastroesophageal reflux disease)   Class 1 obesity due to excess calories with serious comorbidity and body mass index (BMI) of 33.0 to 33.9 in adult   Secondary syphilis in male   Hyperbilirubinemia -jaundice Likely due to hepatitis B infection  -Worsening hyperbilirubinemia -Reinitiating IV fluid hydration lactated Ringer 150 mL/h -We will continue to monitor LFTs, bilirubin   CT abdomen pelvis -reviewed: Revealing Mildly lobular hepatic surface, suggestive of cirrhosis. Splenomegaly, could be an early manifestation of portal hypertension in the setting of cirrhosis.  Imaging studies including abdominal ultrasound - does not reveal any signs of obstruction at this time, therefore no need for MRCP, -Gastroenterology consult, appreciate their input -Abdominal ultrasound -reviewed negative for any obstructions -Total bilirubin 12.5 >> 14.6 -Direct bilirubin 7.4, indirect 5.2 -Continue IV fluid hydration  Acute hepatitis B infection  Hepatitis B IgM positive, pending IgG -Continues to have elevated LFTs:  Alk phos 267 >> 225 AST 720 >> 738 ALT 724 >> 687 Lipase 60 -We will avoid hepatotoxic -GI following recommending outpatient follow-up with  hepatologist    Closed rib fracture -incentive spirometer, needed analgesics - Status post recent fall, sustaining rib fractures -CT scan reviewed -revealing left posterior 4th - 8th rib fractures.  Lumbar fracture CT abdomen pelvis revealing -Subacute appearing left L1-L3 transverse fractures As needed analgesics, PT OT evaluation  Active Problems:  GERD (gastroesophageal reflux disease)/esophagitis -once again apparent on CT scan, continue PPI -referral to GI for  EGD  OSA (obstructive sleep apnea) -outpatient referral for sleep study, does not supply oxygen with CPAP at home   Essential hypertension, benign - 131/82, blood pressure stable not on any home medication at this time   Class 1 obesity due to excess calories with serious comorbidity and body mass index (BMI) of 33.0 to 33.9 in adult -consulted regarding weight loss  H/o  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 Level of care: Med-Surg Family Communication:  none at bedside  (The above findings and plan of care has been discussed with patient in detail, the patient expressed understanding and agreement of above plan)  --------------------------------------------------------------------------------------------------------------------------------------------------  Disposition Plan: 1-2 days Status is: Inpatient  Remains inpatient appropriate because:Inpatient level of care appropriate due to severity of illness   Dispo: The patient is from: Home  Anticipated d/c is to: Home  Patient currently is not medically stable to d/c.              Difficult to place patient No      ----------------------------------------------------------------------------------------------------------------------------------------------- Nutritional status:  The patient's BMI is: Body mass index is 32.95 kg/m. I agree with the assessment and plan as outlined     Procedures:   No admission procedures for hospital encounter.     Antimicrobials:  Anti-infectives (From admission, onward)   None       Medication:  . heparin  5,000 Units Subcutaneous Q8H  . multivitamin with minerals  1 tablet Oral Daily  . senna  1 tablet Oral BID  . sodium chloride flush  3 mL Intravenous Q12H  . sodium chloride flush  3 mL Intravenous Q12H  . venlafaxine XR  150 mg Oral Q breakfast    sodium chloride, alum & mag hydroxide-simeth, bisacodyl, hydrALAZINE, HYDROmorphone (DILAUDID) injection, ibuprofen, ipratropium, levalbuterol, magnesium citrate, methocarbamol (ROBAXIN) IV, ondansetron (ZOFRAN) IV, oxyCODONE, senna-docusate, sodium chloride flush, traZODone   Objective:   Vitals:   08/21/20 1353 08/21/20 1355 08/21/20 2012 08/22/20 0532  BP: 118/81 109/79 122/78 128/82  Pulse: 79 93 83 79  Resp: 19 19    Temp: (!) 97.5 F (36.4 C) (!) 97.5 F (36.4 C) 98.1 F (36.7 C) 97.9 F (36.6 C)  TempSrc: Oral Oral Oral Oral  SpO2: 100% 98% 98% 99%  Weight:      Height:        Intake/Output Summary (Last 24 hours) at 08/22/2020 1101 Last data filed at 08/22/2020 0920 Gross per 24 hour  Intake 3003.25 ml  Output 1400 ml  Net 1603.25 ml   Filed Weights   08/21/20 0049 08/21/20 0854  Weight: 106.6 kg 101.2 kg     Examination:   Physical Exam  Constitution:  Alert, cooperative, no distress,  Appears calm and comfortable  Psychiatric: Normal and  stable mood and affect, cognition intact,   HEENT: Normocephalic, PERRL, otherwise with in Normal limits  Chest:Chest symmetric Cardio vascular:  S1/S2, RRR, No murmure, No Rubs or Gallops  pulmonary: Clear to auscultation bilaterally, respirations unlabored, negative wheezes / crackles Abdomen: Soft, non-tender, non-distended, bowel sounds,no masses, no organomegaly Muscular skeletal: Limited exam - in bed, able to move all 4 extremities, Normal strength,  Neuro: CNII-XII intact. , normal motor and  sensation, reflexes intact  Extremities: No pitting edema lower extremities, +2 pulses  Skin: Icteric eyes, diffuse jaundice  dry, warm to touch, negative for any Rashes, No open wounds Wounds: per nursing documentation    ------------------------------------------------------------------------------------------------------------------------------------------    LABs:  CBC Latest Ref Rng & Units 08/22/2020 08/21/2020 05/19/2020  WBC 4.0 - 10.5 K/uL 6.0 6.1 3.8(L)  Hemoglobin 13.0 - 17.0 g/dL 11.3(L) 11.9(L) 12.7(L)  Hematocrit 39.0 - 52.0 % 31.6(L) 31.0(L) 36.6(L)  Platelets 150 - 400 K/uL 194 242 206.0   CMP Latest Ref Rng & Units 08/22/2020 08/21/2020 08/21/2020  Glucose 70 - 99 mg/dL 98 - -  BUN 6 - 20 mg/dL 8 - -  Creatinine 0.61 - 1.24 mg/dL 0.51(L) - -  Sodium 135 - 145 mmol/L 132(L) - -  Potassium 3.5 - 5.1 mmol/L 4.1 - -  Chloride 98 - 111 mmol/L 102 - -  CO2 22 - 32 mmol/L 25 - -  Calcium 8.9 - 10.3 mg/dL 8.5(L) - -  Total Protein 6.5 - 8.1 g/dL 6.1(L) 6.6 -  Total Bilirubin 0.3 - 1.2 mg/dL 14.6(H) 12.6(H) 12.6(H)  Alkaline Phos 38 - 126 U/L 225(H) 251(H) -  AST 15 - 41 U/L 738(H) 704(H) -  ALT 0 - 44 U/L 687(H) 703(H) -       Micro Results Recent Results (from the past 240 hour(s))  Resp Panel by RT-PCR (Flu A&B, Covid) Nasopharyngeal Swab     Status: None   Collection Time: 08/21/20  4:26 AM   Specimen: Nasopharyngeal Swab; Nasopharyngeal(NP) swabs in vial transport medium  Result Value Ref Range Status   SARS Coronavirus 2 by RT PCR NEGATIVE NEGATIVE Final    Comment: (NOTE) SARS-CoV-2 target nucleic acids are NOT DETECTED.  The SARS-CoV-2 RNA is generally detectable in upper respiratory specimens during the acute phase of infection. The lowest concentration of SARS-CoV-2 viral copies this assay can detect is 138 copies/mL. A negative result does not preclude SARS-Cov-2 infection and should not be used as the sole basis for treatment or other patient  management decisions. A negative result may occur with  improper specimen collection/handling, submission of specimen other than nasopharyngeal swab, presence of viral mutation(s) within the areas targeted by this assay, and inadequate number of viral copies(<138 copies/mL). A negative result must be combined with clinical observations, patient history, and epidemiological information. The expected result is Negative.  Fact Sheet for Patients:  EntrepreneurPulse.com.au  Fact Sheet for Healthcare Providers:  IncredibleEmployment.be  This test is no t yet approved or cleared by the [place] FDA and  has been authorized for detection and/or diagnosis of SARS-CoV-2 by FDA under an Emergency Use Authorization (EUA). This EUA will remain  in effect (meaning this test can be used) for the duration of the COVID-19 declaration under Section 564(b)(1) of the Act, 21 U.S.C.section 360bbb-3(b)(1), unless the authorization is terminated  or revoked sooner.       Influenza A by PCR NEGATIVE NEGATIVE Final   Influenza B by PCR NEGATIVE NEGATIVE Final    Comment: (NOTE) The Xpert Xpress  SARS-CoV-2/FLU/RSV plus assay is intended as an aid in the diagnosis of influenza from Nasopharyngeal swab specimens and should not be used as a sole basis for treatment. Nasal washings and aspirates are unacceptable for Xpert Xpress SARS-CoV-2/FLU/RSV testing.  Fact Sheet for Patients: EntrepreneurPulse.com.au  Fact Sheet for Healthcare Providers: IncredibleEmployment.be  This test is not yet approved or cleared by the [place] FDA and has been authorized for detection and/or diagnosis of SARS-CoV-2 by FDA under an Emergency Use Authorization (EUA). This EUA will remain in effect (meaning this test can be used) for the duration of the COVID-19 declaration under Section 564(b)(1) of the Act, 21 U.S.C. section 360bbb-3(b)(1),  unless the authorization is terminated or revoked.  Performed at [facility], [address] [place], [place] [address]     Radiology Reports DG Ribs Unilateral W/Chest Left  Result Date: 08/21/2020 CLINICAL DATA:  55 year old male with fall and left chest wall pain. EXAM: LEFT RIBS AND CHEST - 3+ VIEW COMPARISON:  None. FINDINGS: There multiple mildly displaced left posterior rib fractures involving the fourth-eighth ribs. No focal consolidation, pleural effusion, pneumothorax. The cardiac silhouette is within limits. IMPRESSION: Multiple mildly displaced left posterior rib fractures. No pneumothorax. Electronically Signed   By: Anner Crete M.D.   On: 08/21/2020 02:29   DG Thoracic Spine 2 View  Result Date: 08/21/2020 CLINICAL DATA:  55 year old male with fall. EXAM: THORACIC SPINE 2 VIEWS COMPARISON:  Chest radiograph dated 08/21/2020. FINDINGS: There is no acute fracture or subluxation of the thoracic spine. Multilevel degenerative changes and mild chronic compression fractures. Partially visualized multiple left posterior rib fractures. The soft tissues are unremarkable. IMPRESSION: 1. No acute fracture or subluxation of the thoracic spine. 2. Left posterior rib fractures. Electronically Signed   By: Anner Crete M.D.   On: 08/21/2020 02:31   CT Chest W Contrast  Result Date: 08/21/2020 CLINICAL DATA:  Fall from ladder 3 weeks ago, persistent back pain. EXAM: CT CHEST, ABDOMEN, AND PELVIS WITH CONTRAST TECHNIQUE: Multidetector CT imaging of the chest, abdomen and pelvis was performed following the standard protocol during bolus administration of intravenous contrast. CONTRAST:  160m OMNIPAQUE IOHEXOL 300 MG/ML  SOLN COMPARISON:  Radiographs 08/21/2020 FINDINGS: CT CHEST FINDINGS Cardiovascular: The aortic root is suboptimally assessed given cardiac pulsation artifact. The aorta is normal caliber. No acute luminal abnormality of the imaged aorta. No periaortic stranding or  hemorrhage. Normal 3 vessel branching of the aortic arch. Proximal great vessels are unremarkable. Normal heart size. No pericardial effusion. Central pulmonary arteries are normal caliber no large central filling defects with more distal evaluation limited by non tailored examination. No major venous abnormalities are seen. Mediastinum/Nodes: No mediastinal fluid or gas. Normal thyroid gland and thoracic inlet. No acute abnormality of the trachea or esophagus. No worrisome mediastinal, hilar or axillary adenopathy. Lungs/Pleura: No acute traumatic abnormality of the lung parenchyma. Thickening adjacent the contiguous posterior left-sided rib fractures appears to be largely subpleural without layering effusion or pneumothorax. Some minimal adjacent ground-glass could be atelectatic or reflective atelectasis versus resolving pulmonary contusive change. Lungs are otherwise clear. No consolidative process or edema. Musculoskeletal: Subacute appearing left posterior fourth through eighth rib fractures. Adjacent thickening appears largely subpleural, as above. No right-sided rib fractures. No other acute traumatic osseous injuries of the chest wall or imaged thoracic spine. Mild dextrocurvature of the mid to upper thoracic levels. CT ABDOMEN PELVIS FINDINGS Hepatobiliary: No direct hepatic injury or perihepatic hematoma. Solitary subcentimeter hypoattenuating focus in the right lobe liver (8/16), statistically  likely benign. No worrisome focal liver lesions. Mildly lobular hepatic surface contour. Normal hepatic attenuation. Normal gallbladder and biliary tree. No significant biliary ductal dilatation or visible gallstones. Pancreas: No pancreatic ductal dilatation or surrounding inflammatory changes. Spleen: Splenomegaly. No focal splenic lesions. No direct splenic injury or perisplenic hematoma. Adrenals/Urinary Tract: Normal adrenal glands. Kidneys are normally located with symmetric enhancementand excretion. Few  subcentimeter hypoattenuating foci are too small to fully characterize. No suspicious renal lesion, urolithiasis or hydronephrosis. Traumatic or acute bladder abnormality is seen. Stomach/Bowel: Distal esophagus, stomach are unremarkable. Minimal stranding adjacent the first and second portion of the duodenum (2/65) without significant wall thickening, extraluminal gas or free fluid. No small bowel wall thickening or dilatation. No evidence of obstruction. A normal appendix is visualized. No colonic dilatation or wall thickening. Vascular/Lymphatic: No significant vascular findings are present. No enlarged abdominal or pelvic lymph nodes. Reproductive: The prostate and seminal vesicles are unremarkable. No acute abnormality of the external genitalia. Other: No abdominopelvic free fluid or free gas. No bowel containing hernias. Musculoskeletal: No acute osseous abnormality or suspicious osseous lesion. Subacute appearing left L1-L3 transverse process fractures. 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 Levocurvature of the lumbar spine, apex L4-5. Multilevel degenerative changes are present in the imaged portions of the spine. Bones of the pelvis are intact and congruent. Proximal femora are intact and normally located. IMPRESSION: 1. Subacute appearing left posterior fourth through eighth rib fractures. Adjacent thickening appears to be largely subpleural without layering effusion or pneumothorax. Some minimal adjacent ground-glass could be atelectatic related to splinting versus resolving pulmonary contusive change. 2. Subacute appearing left L1-L3 transverse process fractures. 3. Minimal stranding adjacent the first and second portion of the duodenum without significant wall thickening, extraluminal gas or free fluid. Findings could reflect a mild duodenitis or peptic ulcer disease in the appropriate clinical setting 4. Mildly lobular hepatic surface,  suggestive of cirrhosis. Splenomegaly, could be an early manifestation of portal hypertension in the setting of cirrhosis. 5. 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 Anatomic variant. These results were called by telephone at the time of interpretation on 08/21/2020 at 6:10 am to provider [facility] , who verbally acknowledged these results. Electronically Signed   By: Lovena Le M.D.   On: 08/21/2020 06:10   CT ABDOMEN PELVIS W CONTRAST  Result Date: 08/21/2020 CLINICAL DATA:  Fall from ladder 3 weeks ago, persistent back pain. EXAM: CT CHEST, ABDOMEN, AND PELVIS WITH CONTRAST TECHNIQUE: Multidetector CT imaging of the chest, abdomen and pelvis was performed following the standard protocol during bolus administration of intravenous contrast. CONTRAST:  14m OMNIPAQUE IOHEXOL 300 MG/ML  SOLN COMPARISON:  Radiographs 08/21/2020 FINDINGS: CT CHEST FINDINGS Cardiovascular: The aortic root is suboptimally assessed given cardiac pulsation artifact. The aorta is normal caliber. No acute luminal abnormality of the imaged aorta. No periaortic stranding or hemorrhage. Normal 3 vessel branching of the aortic arch. Proximal great vessels are unremarkable. Normal heart size. No pericardial effusion. Central pulmonary arteries are normal caliber no large central filling defects with more distal evaluation limited by non tailored examination. No major venous abnormalities are seen. Mediastinum/Nodes: No mediastinal fluid or gas. Normal thyroid gland and thoracic inlet. No acute abnormality of the trachea or esophagus. No worrisome mediastinal, hilar or axillary adenopathy. Lungs/Pleura: No acute traumatic abnormality of the lung parenchyma. Thickening adjacent the contiguous posterior left-sided rib fractures appears to be largely subpleural without layering effusion  or pneumothorax. Some minimal adjacent ground-glass could be atelectatic or reflective  atelectasis versus resolving pulmonary contusive change. Lungs are otherwise clear. No consolidative process or edema. Musculoskeletal: Subacute appearing left posterior fourth through eighth rib fractures. Adjacent thickening appears largely subpleural, as above. No right-sided rib fractures. No other acute traumatic osseous injuries of the chest wall or imaged thoracic spine. Mild dextrocurvature of the mid to upper thoracic levels. CT ABDOMEN PELVIS FINDINGS Hepatobiliary: No direct hepatic injury or perihepatic hematoma. Solitary subcentimeter hypoattenuating focus in the right lobe liver (8/16), statistically likely benign. No worrisome focal liver lesions. Mildly lobular hepatic surface contour. Normal hepatic attenuation. Normal gallbladder and biliary tree. No significant biliary ductal dilatation or visible gallstones. Pancreas: No pancreatic ductal dilatation or surrounding inflammatory changes. Spleen: Splenomegaly. No focal splenic lesions. No direct splenic injury or perisplenic hematoma. Adrenals/Urinary Tract: Normal adrenal glands. Kidneys are normally located with symmetric enhancementand excretion. Few subcentimeter hypoattenuating foci are too small to fully characterize. No suspicious renal lesion, urolithiasis or hydronephrosis. Traumatic or acute bladder abnormality is seen. Stomach/Bowel: Distal esophagus, stomach are unremarkable. Minimal stranding adjacent the first and second portion of the duodenum (2/65) without significant wall thickening, extraluminal gas or free fluid. No small bowel wall thickening or dilatation. No evidence of obstruction. A normal appendix is visualized. No colonic dilatation or wall thickening. Vascular/Lymphatic: No significant vascular findings are present. No enlarged abdominal or pelvic lymph nodes. Reproductive: The prostate and seminal vesicles are unremarkable. No acute abnormality of the external genitalia. Other: No abdominopelvic free fluid or free gas.  No bowel containing hernias. Musculoskeletal: No acute osseous abnormality or suspicious osseous lesion. Subacute appearing left L1-L3 transverse process fractures. 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 Levocurvature of the lumbar spine, apex L4-5. Multilevel degenerative changes are present in the imaged portions of the spine. Bones of the pelvis are intact and congruent. Proximal femora are intact and normally located. IMPRESSION: 1. Subacute appearing left posterior fourth through eighth rib fractures. Adjacent thickening appears to be largely subpleural without layering effusion or pneumothorax. Some minimal adjacent ground-glass could be atelectatic related to splinting versus resolving pulmonary contusive change. 2. Subacute appearing left L1-L3 transverse process fractures. 3. Minimal stranding adjacent the first and second portion of the duodenum without significant wall thickening, extraluminal gas or free fluid. Findings could reflect a mild duodenitis or peptic ulcer disease in the appropriate clinical setting 4. Mildly lobular hepatic surface, suggestive of cirrhosis. Splenomegaly, could be an early manifestation of portal hypertension in the setting of cirrhosis. 5. 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 Anatomic variant. These results were called by telephone at the time of interpretation on 08/21/2020 at 6:10 am to provider [facility] , who verbally acknowledged these results. Electronically Signed   By: Lovena Le M.D.   On: 08/21/2020 06:10   US Abdomen Limited RUQ (LIVER/GB)  Result Date: 08/21/2020 CLINICAL DATA:  Pain of the abdomen in status post fall 3 weeks ago. EXAM: ULTRASOUND ABDOMEN LIMITED RIGHT UPPER QUADRANT COMPARISON:  CT abdomen pelvis August 21, 2020 FINDINGS: Gallbladder: No gallstones or wall thickening visualized. No sonographic Murphy sign noted  by sonographer. Common bile duct: Diameter: 4 mm Liver: There is a 0.9 x 0.8 x 1 cm simple cyst in the right lobe liver correlating to CT finding. Within normal limits in parenchymal echogenicity. Portal vein is patent on color Doppler imaging with normal direction of blood flow  towards the liver. Other: None. IMPRESSION: No sonographic evidence of acute cholecystitis. Simple cyst right lobe liver. Electronically Signed   By: Abelardo Diesel M.D.   On: 08/21/2020 09:22    SIGNED: Deatra James, MD, FHM. [facility],  Pager (please use [email or web address] to page/text) Please use Epic Secure Chat for non-urgent communication (7AM-7PM)  If 7PM-7AM, please contact night-coverage [email or web address], 08/22/2020, 11:01 AM

## 2020-08-22 NOTE — Progress Notes (Signed)
Patient without complaints this morning other than left rib cage pain.  He is tolerating his diet very well.  No fever.  Hepatitis B IgM antibody positive; antigen pending  Vital signs in last 24 hours: Temp:  [97.5 F (36.4 C)-98.1 F (36.7 C)] 97.9 F (36.6 C) (03/20 0532) Pulse Rate:  [79-93] 79 (03/20 0532) Resp:  [18-19] 19 (03/19 1355) BP: (109-128)/(76-82) 128/82 (03/20 0532) SpO2:  [98 %-100 %] 99 % (03/20 0532) Last BM Date: 08/20/20 General:   Alert,   pleasant and cooperative in NAD Abdomen: Nondistended.  Positive bowel sounds soft nontender.   Extremities:  Without clubbing or edema.    Intake/Output from previous day: 03/19 0701 - 03/20 0700 In: 2760.3 [P.O.:480; I.V.:2280.3] Out: 1000 [Urine:1000] Intake/Output this shift: Total I/O In: 3 [I.V.:3] Out: -   Lab Results: Recent Labs    08/21/20 0317 08/22/20 0515  WBC 6.1 6.0  HGB 11.9* 11.3*  HCT 31.0* 31.6*  PLT 242 194   BMET Recent Labs    08/21/20 0317 08/22/20 0515  NA 133* 132*  K 3.9 4.1  CL 100 102  CO2 25 25  GLUCOSE 111* 98  BUN 9 8  CREATININE 0.72 0.51*  CALCIUM 8.8* 8.5*   LFT Recent Labs    08/21/20 1036 08/22/20 0515  PROT 6.6 6.1*  ALBUMIN 2.8* 2.7*  AST 704* 738*  ALT 703* 687*  ALKPHOS 251* 225*  BILITOT 12.6* 14.6*  BILIDIR 7.4*  --   IBILI 5.2*  --    PT/INR Recent Labs    08/21/20 0317 08/22/20 0515  LABPROT 13.8 14.6  INR 1.1 1.2   Hepatitis Panel Recent Labs    08/21/20 0317  HEPBSAG PENDING  HCVAB NON REACTIVE  HEPAIGM NON REACTIVE  HEPBIGM Reactive*   C-Diff No results for input(s): CDIFFTOX in the last 72 hours.  Studies/Results: DG Ribs Unilateral W/Chest Left  Result Date: 08/21/2020 CLINICAL DATA:  55 year old male with fall and left chest wall pain. EXAM: LEFT RIBS AND CHEST - 3+ VIEW COMPARISON:  None. FINDINGS: There multiple mildly displaced left posterior rib fractures involving the fourth-eighth ribs. No focal consolidation,  pleural effusion, pneumothorax. The cardiac silhouette is within limits. IMPRESSION: Multiple mildly displaced left posterior rib fractures. No pneumothorax. Electronically Signed   By: Elgie Collard M.D.   On: 08/21/2020 02:29   DG Thoracic Spine 2 View  Result Date: 08/21/2020 CLINICAL DATA:  55 year old male with fall. EXAM: THORACIC SPINE 2 VIEWS COMPARISON:  Chest radiograph dated 08/21/2020. FINDINGS: There is no acute fracture or subluxation of the thoracic spine. Multilevel degenerative changes and mild chronic compression fractures. Partially visualized multiple left posterior rib fractures. The soft tissues are unremarkable. IMPRESSION: 1. No acute fracture or subluxation of the thoracic spine. 2. Left posterior rib fractures. Electronically Signed   By: Elgie Collard M.D.   On: 08/21/2020 02:31   CT Chest W Contrast  Result Date: 08/21/2020 CLINICAL DATA:  Fall from ladder 3 weeks ago, persistent back pain. EXAM: CT CHEST, ABDOMEN, AND PELVIS WITH CONTRAST TECHNIQUE: Multidetector CT imaging of the chest, abdomen and pelvis was performed following the standard protocol during bolus administration of intravenous contrast. CONTRAST:  OMNIPAQUE IOHEXOL 300 MG/ML  SOLN COMPARISON:  Radiographs 08/21/2020 FINDINGS: CT CHEST FINDINGS Cardiovascular: The aortic root is suboptimally assessed given cardiac pulsation artifact. The aorta is normal caliber. No acute luminal abnormality of the imaged aorta. No periaortic stranding or hemorrhage. Normal 3 vessel branching of the aortic arch. Proximal  great vessels are unremarkable. Normal heart size. No pericardial effusion. Central pulmonary arteries are normal caliber no large central filling defects with more distal evaluation limited by non tailored examination. No major venous abnormalities are seen. Mediastinum/Nodes: No mediastinal fluid or gas. Normal thyroid gland and thoracic inlet. No acute abnormality of the trachea or esophagus. No  worrisome mediastinal, hilar or axillary adenopathy. Lungs/Pleura: No acute traumatic abnormality of the lung parenchyma. Thickening adjacent the contiguous posterior left-sided rib fractures appears to be largely subpleural without layering effusion or pneumothorax. Some minimal adjacent ground-glass could be atelectatic or reflective atelectasis versus resolving pulmonary contusive change. Lungs are otherwise clear. No consolidative process or edema. Musculoskeletal: Subacute appearing left posterior fourth through eighth rib fractures. Adjacent thickening appears largely subpleural, as above. No right-sided rib fractures. No other acute traumatic osseous injuries of the chest wall or imaged thoracic spine. Mild dextrocurvature of the mid to upper thoracic levels. CT ABDOMEN PELVIS FINDINGS Hepatobiliary: No direct hepatic injury or perihepatic hematoma. Solitary subcentimeter hypoattenuating focus in the right lobe liver (8/16), statistically likely benign. No worrisome focal liver lesions. Mildly lobular hepatic surface contour. Normal hepatic attenuation. Normal gallbladder and biliary tree. No significant biliary ductal dilatation or visible gallstones. Pancreas: No pancreatic ductal dilatation or surrounding inflammatory changes. Spleen: Splenomegaly. No focal splenic lesions. No direct splenic injury or perisplenic hematoma. Adrenals/Urinary Tract: Normal adrenal glands. Kidneys are normally located with symmetric enhancementand excretion. Few subcentimeter hypoattenuating foci are too small to fully characterize. No suspicious renal lesion, urolithiasis or hydronephrosis. Traumatic or acute bladder abnormality is seen. Stomach/Bowel: Distal esophagus, stomach are unremarkable. Minimal stranding adjacent the first and second portion of the duodenum (2/65) without significant wall thickening, extraluminal gas or free fluid. No small bowel wall thickening or dilatation. No evidence of obstruction. A normal  appendix is visualized. No colonic dilatation or wall thickening. Vascular/Lymphatic: No significant vascular findings are present. No enlarged abdominal or pelvic lymph nodes. Reproductive: The prostate and seminal vesicles are unremarkable. No acute abnormality of the external genitalia. Other: No abdominopelvic free fluid or free gas. No bowel containing hernias. Musculoskeletal: No acute osseous abnormality or suspicious osseous lesion. Subacute appearing left L1-L3 transverse process fractures. 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 Levocurvature of the lumbar spine, apex L4-5. Multilevel degenerative changes are present in the imaged portions of the spine. Bones of the pelvis are intact and congruent. Proximal femora are intact and normally located. IMPRESSION: 1. Subacute appearing left posterior fourth through eighth rib fractures. Adjacent thickening appears to be largely subpleural without layering effusion or pneumothorax. Some minimal adjacent ground-glass could be atelectatic related to splinting versus resolving pulmonary contusive change. 2. Subacute appearing left L1-L3 transverse process fractures. 3. Minimal stranding adjacent the first and second portion of the duodenum without significant wall thickening, extraluminal gas or free fluid. Findings could reflect a mild duodenitis or peptic ulcer disease in the appropriate clinical setting 4. Mildly lobular hepatic surface, suggestive of cirrhosis. Splenomegaly, could be an early manifestation of portal hypertension in the setting of cirrhosis. 5. 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 Anatomic variant. These results were called by telephone at the time of interpretation on 08/21/2020 at 6:10 am to provider [facility] , who verbally acknowledged these results. Electronically Signed   By: Kreg Shropshire M.D.   On: 08/21/2020  06:10   CT ABDOMEN PELVIS W CONTRAST  Result Date: 08/21/2020 CLINICAL DATA:  Fall from ladder 3 weeks ago, persistent back pain. EXAM: CT CHEST, ABDOMEN, AND PELVIS WITH CONTRAST TECHNIQUE: Multidetector CT imaging of the chest, abdomen and pelvis was performed following the standard protocol during bolus administration of intravenous contrast. CONTRAST:  OMNIPAQUE IOHEXOL 300 MG/ML  SOLN COMPARISON:  Radiographs 08/21/2020 FINDINGS: CT CHEST FINDINGS Cardiovascular: The aortic root is suboptimally assessed given cardiac pulsation artifact. The aorta is normal caliber. No acute luminal abnormality of the imaged aorta. No periaortic stranding or hemorrhage. Normal 3 vessel branching of the aortic arch. Proximal great vessels are unremarkable. Normal heart size. No pericardial effusion. Central pulmonary arteries are normal caliber no large central filling defects with more distal evaluation limited by non tailored examination. No major venous abnormalities are seen. Mediastinum/Nodes: No mediastinal fluid or gas. Normal thyroid gland and thoracic inlet. No acute abnormality of the trachea or esophagus. No worrisome mediastinal, hilar or axillary adenopathy. Lungs/Pleura: No acute traumatic abnormality of the lung parenchyma. Thickening adjacent the contiguous posterior left-sided rib fractures appears to be largely subpleural without layering effusion or pneumothorax. Some minimal adjacent ground-glass could be atelectatic or reflective atelectasis versus resolving pulmonary contusive change. Lungs are otherwise clear. No consolidative process or edema. Musculoskeletal: Subacute appearing left posterior fourth through eighth rib fractures. Adjacent thickening appears largely subpleural, as above. No right-sided rib fractures. No other acute traumatic osseous injuries of the chest wall or imaged thoracic spine. Mild dextrocurvature of the mid to upper thoracic levels. CT ABDOMEN PELVIS FINDINGS  Hepatobiliary: No direct hepatic injury or perihepatic hematoma. Solitary subcentimeter hypoattenuating focus in the right lobe liver (8/16), statistically likely benign. No worrisome focal liver lesions. Mildly lobular hepatic surface contour. Normal hepatic attenuation. Normal gallbladder and biliary tree. No significant biliary ductal dilatation or visible gallstones. Pancreas: No pancreatic ductal dilatation or surrounding inflammatory changes. Spleen: Splenomegaly. No focal splenic lesions. No direct splenic injury or perisplenic hematoma. Adrenals/Urinary Tract: Normal adrenal glands. Kidneys are normally located with symmetric enhancementand excretion. Few subcentimeter hypoattenuating foci are too small to fully characterize. No suspicious renal lesion, urolithiasis or hydronephrosis. Traumatic or acute bladder abnormality is seen. Stomach/Bowel: Distal esophagus, stomach are unremarkable. Minimal stranding adjacent the first and second portion of the duodenum (2/65) without significant wall thickening, extraluminal gas or free fluid. No small bowel wall thickening or dilatation. No evidence of obstruction. A normal appendix is visualized. No colonic dilatation or wall thickening. Vascular/Lymphatic: No significant vascular findings are present. No enlarged abdominal or pelvic lymph nodes. Reproductive: The prostate and seminal vesicles are unremarkable. No acute abnormality of the external genitalia. Other: No abdominopelvic free fluid or free gas. No bowel containing hernias. Musculoskeletal: No acute osseous abnormality or suspicious osseous lesion. Subacute appearing left L1-L3 transverse process fractures. 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 Levocurvature of the lumbar spine, apex L4-5. Multilevel degenerative changes are present in the imaged portions of the spine. Bones of the pelvis are intact and congruent. Proximal femora are intact  and normally located. IMPRESSION: 1. Subacute appearing left posterior fourth through eighth rib fractures. Adjacent thickening appears to be largely subpleural without layering effusion or pneumothorax. Some minimal adjacent ground-glass could be atelectatic related to splinting versus resolving pulmonary contusive change. 2. Subacute appearing left L1-L3 transverse process fractures. 3. Minimal stranding adjacent the first and second portion of the duodenum without significant wall thickening, extraluminal gas or free fluid. Findings could reflect a mild duodenitis or peptic ulcer disease in the appropriate clinical setting  4. Mildly lobular hepatic surface, suggestive of cirrhosis. Splenomegaly, could be an early manifestation of portal hypertension in the setting of cirrhosis. 5. 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 Anatomic variant. These results were called by telephone at the time of interpretation on 08/21/2020 at 6:10 am [facility] , who verbally acknowledged these results. Electronically Signed   By: Kreg ShropshirePrice  DeHay M.D.   On: 08/21/2020 06:10   US Abdomen Limited RUQ (LIVER/GB)  Result Date: 08/21/2020 CLINICAL DATA:  Pain of the abdomen in status post fall 3 weeks ago. EXAM: ULTRASOUND ABDOMEN LIMITED RIGHT UPPER QUADRANT COMPARISON:  CT abdomen pelvis August 21, 2020 FINDINGS: Gallbladder: No gallstones or wall thickening visualized. No sonographic Murphy sign noted by sonographer. Common bile duct: Diameter: 4 mm Liver: There is a 0.9 x 0.8 x 1 cm simple cyst in the right lobe liver correlating to CT finding. Within normal limits in parenchymal echogenicity. Portal vein is patent on color Doppler imaging with normal direction of blood flow towards the liver. Other: None. IMPRESSION: No sonographic evidence of acute cholecystitis. Simple cyst right lobe liver. Electronically Signed   By: Sherian ReinWei-Chen  Lin M.D.   On: 08/21/2020  09:22   Impression:    55 year old gentleman with a history of secondary syphilis and recent blunt force trauma resulting in multiple rib fractures admitted with acute icteric hepatitis. Hepatitis B IgM antibody positive makes a diagnosis of acute hepatitis B infection as the cause of his acute liver injury. Based on cross-sectional imaging he has newly diagnosed occult chronic liver disease as well which is a separate issue. He does not appear to be running a fulminant course.  INR remains normal.  Bilirubin up a bit;  transaminases stable.  We may see bilirubin rise further before it plateaus and starts to normalize.  The fact that he presented with icteric hepatitis in the setting of acute hepatitis B infection is reassuring in so far as he is most likely to resolve the infection as most patients do.  GERD-controlled with PPI.  Recommendations:  Good nutrition and rest for now  Repeat INR, LFTs in 2 days.  Continue once daily PPI.  We will make arrangements for him to return to the office for hepatology and general GI /cirrhosis care.  I have recommended that he minimize the use of NSAIDs and abstain from all alcohol ingestion  From a GI standpoint, it is okay if he is discharged anytime.

## 2020-08-23 LAB — GLUCOSE, CAPILLARY
Glucose-Capillary: 90 mg/dL (ref 70–99)
Glucose-Capillary: 96 mg/dL (ref 70–99)

## 2020-08-23 LAB — CBC
HCT: 28.1 % — ABNORMAL LOW (ref 39.0–52.0)
Hemoglobin: 11.2 g/dL — ABNORMAL LOW (ref 13.0–17.0)
MCH: 44.1 pg — ABNORMAL HIGH (ref 26.0–34.0)
MCHC: 39.9 g/dL — ABNORMAL HIGH (ref 30.0–36.0)
MCV: 110.6 fL — ABNORMAL HIGH (ref 80.0–100.0)
Platelets: 247 10*3/uL (ref 150–400)
RBC: 2.54 MIL/uL — ABNORMAL LOW (ref 4.22–5.81)
RDW: 21.8 % — ABNORMAL HIGH (ref 11.5–15.5)
WBC: 6.5 10*3/uL (ref 4.0–10.5)
nRBC: 0 % (ref 0.0–0.2)

## 2020-08-23 LAB — COMPREHENSIVE METABOLIC PANEL
ALT: 766 U/L — ABNORMAL HIGH (ref 0–44)
AST: 911 U/L — ABNORMAL HIGH (ref 15–41)
Albumin: 2.7 g/dL — ABNORMAL LOW (ref 3.5–5.0)
Alkaline Phosphatase: 231 U/L — ABNORMAL HIGH (ref 38–126)
Anion gap: 7 (ref 5–15)
BUN: 7 mg/dL (ref 6–20)
CO2: 27 mmol/L (ref 22–32)
Calcium: 8.9 mg/dL (ref 8.9–10.3)
Chloride: 100 mmol/L (ref 98–111)
Creatinine, Ser: 0.53 mg/dL — ABNORMAL LOW (ref 0.61–1.24)
GFR, Estimated: >60 mL/min (ref >60)
Glucose, Bld: 104 mg/dL — ABNORMAL HIGH (ref 70–99)
Potassium: 4.1 mmol/L (ref 3.5–5.1)
Sodium: 134 mmol/L — ABNORMAL LOW (ref 135–145)
Total Bilirubin: 17.9 mg/dL — ABNORMAL HIGH (ref 0.3–1.2)
Total Protein: 6.2 g/dL — ABNORMAL LOW (ref 6.5–8.1)

## 2020-08-23 LAB — HAPTOGLOBIN: Haptoglobin: <10 mg/dL — ABNORMAL LOW (ref 29–370)

## 2020-08-23 LAB — PROTIME-INR
INR: 1 (ref 0.8–1.2)
Prothrombin Time: 13.1 seconds (ref 11.4–15.2)

## 2020-08-23 MED ORDER — SODIUM CHLORIDE 0.9 % IV SOLN: INTRAVENOUS | Status: DC

## 2020-08-23 MED ORDER — HYDROXYZINE HCL 10 MG PO TABS: 10.0000 mg | ORAL_TABLET | Freq: Four times a day (QID) | ORAL | Status: DC | PRN

## 2020-08-23 MED ORDER — FUROSEMIDE 10 MG/ML IJ SOLN
40.0000 mg | Freq: Once | INTRAMUSCULAR | Status: AC
  Administered 2020-08-23: 40 mg via INTRAVENOUS
  Filled 2020-08-23: qty 4

## 2020-08-23 NOTE — Progress Notes (Signed)
PT Cancellation Note  Patient Details Name: Brandon Lane MRN: [id number] DOB: Mar 05, 1966   Cancelled Treatment:    Reason Eval/Treat Not Completed: PT screened, no needs identified, will sign off.  Patient demonstrates good return for ambulation in room and hallways without problem.  Plan:  Patient discharged from physical therapy to care of nursing for ambulation daily as tolerated for length of stay.   10:47 AM, 08/23/20 Ocie Bob, MPT Physical Therapist with [facility] 336 [phone] office [phone] mobile phone

## 2020-08-23 NOTE — Progress Notes (Signed)
OT Cancellation Note  Patient Details Name: Joffre Lucks MRN: [id number] DOB: 09-Aug-1965   Cancelled Treatment:    Reason Eval/Treat Not Completed: OT screened, no needs identified, will sign off. Pt demonstrated ability to leave bed and ambulate to sink to wash hands independently. Pt reported 3/10 pain in L upper back area. Plan: Patient discharged from occupational therapy to care of nursing for ADL tasks during length of stay.   Zariah Cavendish OT, MOT  Danie Chandler 08/23/2020, 11:40 AM

## 2020-08-23 NOTE — Progress Notes (Signed)
PROGRESS NOTE    Patient: Brandon Lane                            PCP: Janith Lima, MD                    DOB: [birth year]            DOA: 08/21/2020 ZOX:[id number]             DOS: 08/23/2020, 11:53 AM   LOS: 2 days   Date of Service: The patient was seen and examined on 08/23/2020  Subjective:   The patient was seen and examined today hemodynamically stable worsening discoloration of skin with yellowish, worsening eye discoloration -yellow.  Complaining of progressive generalized itching and discomfort. Denies any abdominal pain  Brief Narrative:   Brandon Lane is a 55 y.o. male with medical history significant of with past medical history of GERD, OSA, history of syphilis, hypertension, depression, DDD, status post recent fall from a ladder approximately 4 weeks ago, falling backwards hitting around 4 to 5 feet on his back presenting today with pain discomfort and yellowish of skin. Describes the fall as accidental, did not hit his head or loss consciousness.-Now experiencing left upper back, rib pain no radiation worse with movement and deep breathing. Admit that he has been taken ibuprofen and Tylenol without relief. Per patient and family he is more yellowish in skin, which has gotten worse and over past few days.  Patient Denies having: Fever, Chills, Cough, SOB, Chest Pain, Abd pain, N/V/D, headache, dizziness, lightheadedness,  Dysuria, Joint pain, rash, open wounds.. Denies of having any weakness in his his legs or arms.  ED Course:   Vitals: Medically stable, Abnormal labs; elevated LFTs AST 720, ALT 724, alk phos 267, total bilirubin 12.5, lipase 60, Tylenol level < 10 Chest x-ray multiple rib fractures negative for pneumothorax, EKG normal sinus rhythm, CT abdomen pelvis chest reviewed consistent with rib fractures, subacute lower lumbar fractures, CT did not reveal any obstruction-inflammation of the duodenum and liver nodules identified, subacute left-sided rib  fractures and lumbar spine transverse process fractures    Assessment & Plan:   Principal Problem:   Hyperbilirubinemia Active Problems:   Hepatitis   Closed rib fracture   OSA (obstructive sleep apnea)   Essential hypertension, benign   GERD (gastroesophageal reflux disease)   Class 1 obesity due to excess calories with serious comorbidity and body mass index (BMI) of 33.0 to 33.9 in adult   Secondary syphilis in male   Hyperbilirubinemia -jaundice Likely due to hepatitis B infection  -Worsening hyperbilirubinemia -Patient has been on aggressive IV fluid lactated Ringer switch to normal saline continue 150 mL/h -Monitoring LFTs-worsening   CT abdomen pelvis -reviewed: Revealing Mildly lobular hepatic surface, suggestive of cirrhosis. Splenomegaly, could be an early manifestation of portal hypertension in the setting of cirrhosis.  Imaging studies including abdominal ultrasound - does not reveal any signs of obstruction at this time, therefore no need for MRCP, -Gastroenterology consult, appreciate their input -Abdominal ultrasound -reviewed negative for any obstructions -Total bilirubin 12.5 >> 14.6 >>> 19.9 -Direct bilirubin 7.4, indirect 5.2  -Continue IV fluid hydration  Acute hepatitis B infection  Hepatitis B IgM positive, pending IgG -Continues to have elevated LFTs:  Alk phos 267 >> 225 >> 231 AST 720 >> 738 >> 911  ALT 724 >> 687 >> 766 Lipase 60 -We will avoid hepatotoxic -GI following recommending outpatient  follow-up with hepatologist Appreciate further evaluation by GI and input    Closed rib fracture  -incentive spirometer, needed analgesics -Continue to complain of pain but improved with pain medication - Status post recent fall, sustaining rib fractures -CT scan reviewed -revealing left posterior 4th - 8th rib fractures.  Lumbar fracture CT abdomen pelvis revealing -Subacute appearing left L1-L3 transverse fractures As needed analgesics, PT OT  evaluation -Remained stable  Active Problems:  GERD (gastroesophageal reflux disease)/esophagitis -once again apparent on CT scan, continue PPI -referral to GI for EGD  OSA (obstructive sleep apnea) -outpatient referral for sleep study, does not supply oxygen with CPAP at home   Essential hypertension, benign - BP remained stable - not on any home medication at this time   Class 1 obesity due to excess calories with serious comorbidity and body mass index (BMI) of 33.0 to 33.9 in adult -consulted regarding weight loss  H/o  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     History of depression -resuming home medication of Effexor  Cultures:  -none  Antimicrobial: -none   Consults called:   Gastroenterologist -------------------------------------------------------------------------------------------------------------------------------------------- DVT prophylaxis: SCD/Compression stockings and Heparin SQ Code Status:   Code Status: Full Code Admission status:  Admitted as Inpatient,  Level of care: Med-Surg Family Communication:   Discussed with patient and his wife on the phone (The above findings and plan of care has been discussed with patient in detail, the patient expressed understanding and agreement of above plan)  --------------------------------------------------------------------------------------------------------------------------------------------------  Disposition Plan: 1-2 days Status is: Inpatient  Remains inpatient appropriate because:Inpatient level of care appropriate due to severity of illness   Dispo: The patient is from: Home  Anticipated d/c is to: Home  Patient currently is not medically stable to d/c.              Difficult to place patient  No      ----------------------------------------------------------------------------------------------------------------------------------------------- Nutritional status:  The patient's BMI is: Body mass index is 33.01 kg/m. I agree with the assessment and plan as outlined    Procedures:   No admission procedures for hospital encounter.     Antimicrobials:  Anti-infectives (From admission, onward)   None       Medication:  . heparin  5,000 Units Subcutaneous Q8H  . multivitamin with minerals  1 tablet Oral Daily  . senna  1 tablet Oral BID  . sodium chloride flush  3 mL Intravenous Q12H  . sodium chloride flush  3 mL Intravenous Q12H  . venlafaxine XR  150 mg Oral Q breakfast    sodium chloride, alum & mag hydroxide-simeth, bisacodyl, hydrALAZINE, HYDROmorphone (DILAUDID) injection, hydrOXYzine, ibuprofen, ipratropium, levalbuterol, magnesium citrate, methocarbamol (ROBAXIN) IV, ondansetron (ZOFRAN) IV, oxyCODONE, senna-docusate, sodium chloride flush, traZODone   Objective:   Vitals:   08/22/20 0532 08/22/20 1257 08/22/20 2102 08/23/20 0506  BP: 128/82 135/69 114/70 126/79  Pulse: 79 85 86 92  Resp:  _0 Temp: 97.9 F (36.6 C) 97.7 F (36.5 C) 97.8 F (36.6 C) 98.5 F (36.9 C)  TempSrc: Oral Oral Oral Oral  SpO2: 99% 97% 98% 99%  Weight:    101.4 kg  Height:        Intake/Output Summary (Last 24 hours) at 08/23/2020 1153 Last data filed at 08/23/2020 0900 Gross per 24 hour  Intake 3626.22 ml  Output 750 ml  Net 2876.22 ml   Filed Weights   08/21/20 0049 08/21/20 0854 08/23/20 0506  Weight: 106.6 kg 101.2 kg 101.4 kg  Examination:      Physical Exam:   General:  Alert, oriented, cooperative, no distress;   HEENT:  Normocephalic, PERRL, otherwise with in Normal limits   Neuro:  CNII-XII intact. , normal motor and sensation, reflexes intact   Lungs:   Clear to auscultation BL, Respirations unlabored, no wheezes / crackles   Cardio:    S1/S2, RRR, No murmure, No Rubs or Gallops   Abdomen:   Soft, non-tender, bowel sounds active all four quadrants,  no guarding or peritoneal signs.  Muscular skeletal:  Limited exam - in bed, able to move all 4 extremities, Normal strength,  2+ pulses,  symmetric, No pitting edema  Skin:   Jaundice, icteric - dry, warm to touch, negative for any Rashes,  Wounds: Please see nursing documentation         ------------------------------------------------------------------------------------------------------------------------------------------    LABs:  CBC Latest Ref Rng & Units 08/23/2020 08/22/2020 08/21/2020  WBC 4.0 - 10.5 K/uL 6.5 6.0 6.1  Hemoglobin 13.0 - 17.0 g/dL 11.2(L) 11.3(L) 11.9(L)  Hematocrit 39.0 - 52.0 % 28.1(L) 31.6(L) 31.0(L)  Platelets 150 - 400 K/uL 247 194 242   CMP Latest Ref Rng & Units 08/23/2020 08/22/2020 08/21/2020  Glucose 70 - 99 mg/dL 104(H) 98 -  BUN 6 - 20 mg/dL 7 8 -  Creatinine 0.61 - 1.24 mg/dL 0.53(L) 0.51(L) -  Sodium 135 - 145 mmol/L 134(L) 132(L) -  Potassium 3.5 - 5.1 mmol/L 4.1 4.1 -  Chloride 98 - 111 mmol/L 100 102 -  CO2 22 - 32 mmol/L 27 25 -  Calcium 8.9 - 10.3 mg/dL 8.9 8.5(L) -  Total Protein 6.5 - 8.1 g/dL 6.2(L) 6.1(L) 6.6  Total Bilirubin 0.3 - 1.2 mg/dL 17.9(H) 14.6(H) 12.6(H)  Alkaline Phos 38 - 126 U/L 231(H) 225(H) 251(H)  AST 15 - 41 U/L 911(H) 738(H) 704(H)  ALT 0 - 44 U/L 766(H) 687(H) 703(H)       Micro Results Recent Results (from the past 240 hour(s))  Resp Panel by RT-PCR (Flu A&B, Covid) Nasopharyngeal Swab     Status: None   Collection Time: 08/21/20  4:26 AM   Specimen: Nasopharyngeal Swab; Nasopharyngeal(NP) swabs in vial transport medium  Result Value Ref Range Status   SARS Coronavirus 2 by RT PCR NEGATIVE NEGATIVE Final    Comment: (NOTE) SARS-CoV-2 target nucleic acids are NOT DETECTED.  The SARS-CoV-2 RNA is generally detectable in upper respiratory specimens during the acute phase of  infection. The lowest concentration of SARS-CoV-2 viral copies this assay can detect is 138 copies/mL. A negative result does not preclude SARS-Cov-2 infection and should not be used as the sole basis for treatment or other patient management decisions. A negative result may occur with  improper specimen collection/handling, submission of specimen other than nasopharyngeal swab, presence of viral mutation(s) within the areas targeted by this assay, and inadequate number of viral copies(<138 copies/mL). A negative result must be combined with clinical observations, patient history, and epidemiological information. The expected result is Negative.  Fact Sheet for Patients:  EntrepreneurPulse.com.au  Fact Sheet for Healthcare Providers:  IncredibleEmployment.be  This test is no t yet approved or cleared by the [place] FDA and  has been authorized for detection and/or diagnosis of SARS-CoV-2 by FDA under an Emergency Use Authorization (EUA). This EUA will remain  in effect (meaning this test can be used) for the duration of the COVID-19 declaration under Section 564(b)(1) of the Act, 21 U.S.C.section 360bbb-3(b)(1), unless the authorization is terminated  or revoked  sooner.       Influenza A by PCR NEGATIVE NEGATIVE Final   Influenza B by PCR NEGATIVE NEGATIVE Final    Comment: (NOTE) The Xpert Xpress SARS-CoV-2/FLU/RSV plus assay is intended as an aid in the diagnosis of influenza from Nasopharyngeal swab specimens and should not be used as a sole basis for treatment. Nasal washings and aspirates are unacceptable for Xpert Xpress SARS-CoV-2/FLU/RSV testing.  Fact Sheet for Patients: EntrepreneurPulse.com.au  Fact Sheet for Healthcare Providers: IncredibleEmployment.be  This test is not yet approved or cleared by the [place] FDA and has been authorized for detection and/or diagnosis of SARS-CoV-2  by FDA under an Emergency Use Authorization (EUA). This EUA will remain in effect (meaning this test can be used) for the duration of the COVID-19 declaration under Section 564(b)(1) of the Act, 21 U.S.C. section 360bbb-3(b)(1), unless the authorization is terminated or revoked.  Performed at [facility], [address], [place], [place] [address]     Radiology Reports DG Ribs Unilateral W/Chest Left  Result Date: 08/21/2020 CLINICAL DATA:  55 year old male with fall and left chest wall pain. EXAM: LEFT RIBS AND CHEST - 3+ VIEW COMPARISON:  None. FINDINGS: There multiple mildly displaced left posterior rib fractures involving the fourth-eighth ribs. No focal consolidation, pleural effusion, pneumothorax. The cardiac silhouette is within limits. IMPRESSION: Multiple mildly displaced left posterior rib fractures. No pneumothorax. Electronically Signed   By: Anner Crete M.D.   On: 08/21/2020 02:29   DG Thoracic Spine 2 View  Result Date: 08/21/2020 CLINICAL DATA:  55 year old male with fall. EXAM: THORACIC SPINE 2 VIEWS COMPARISON:  Chest radiograph dated 08/21/2020. FINDINGS: There is no acute fracture or subluxation of the thoracic spine. Multilevel degenerative changes and mild chronic compression fractures. Partially visualized multiple left posterior rib fractures. The soft tissues are unremarkable. IMPRESSION: 1. No acute fracture or subluxation of the thoracic spine. 2. Left posterior rib fractures. Electronically Signed   By: Anner Crete M.D.   On: 08/21/2020 02:31   CT Chest W Contrast  Result Date: 08/21/2020 CLINICAL DATA:  Fall from ladder 3 weeks ago, persistent back pain. EXAM: CT CHEST, ABDOMEN, AND PELVIS WITH CONTRAST TECHNIQUE: Multidetector CT imaging of the chest, abdomen and pelvis was performed following the standard protocol during bolus administration of intravenous contrast. CONTRAST:  1101m OMNIPAQUE IOHEXOL 300 MG/ML  SOLN COMPARISON:  Radiographs 08/21/2020  FINDINGS: CT CHEST FINDINGS Cardiovascular: The aortic root is suboptimally assessed given cardiac pulsation artifact. The aorta is normal caliber. No acute luminal abnormality of the imaged aorta. No periaortic stranding or hemorrhage. Normal 3 vessel branching of the aortic arch. Proximal great vessels are unremarkable. Normal heart size. No pericardial effusion. Central pulmonary arteries are normal caliber no large central filling defects with more distal evaluation limited by non tailored examination. No major venous abnormalities are seen. Mediastinum/Nodes: No mediastinal fluid or gas. Normal thyroid gland and thoracic inlet. No acute abnormality of the trachea or esophagus. No worrisome mediastinal, hilar or axillary adenopathy. Lungs/Pleura: No acute traumatic abnormality of the lung parenchyma. Thickening adjacent the contiguous posterior left-sided rib fractures appears to be largely subpleural without layering effusion or pneumothorax. Some minimal adjacent ground-glass could be atelectatic or reflective atelectasis versus resolving pulmonary contusive change. Lungs are otherwise clear. No consolidative process or edema. Musculoskeletal: Subacute appearing left posterior fourth through eighth rib fractures. Adjacent thickening appears largely subpleural, as above. No right-sided rib fractures. No other acute traumatic osseous injuries of the chest wall or imaged thoracic spine. Mild  dextrocurvature of the mid to upper thoracic levels. CT ABDOMEN PELVIS FINDINGS Hepatobiliary: No direct hepatic injury or perihepatic hematoma. Solitary subcentimeter hypoattenuating focus in the right lobe liver (8/16), statistically likely benign. No worrisome focal liver lesions. Mildly lobular hepatic surface contour. Normal hepatic attenuation. Normal gallbladder and biliary tree. No significant biliary ductal dilatation or visible gallstones. Pancreas: No pancreatic ductal dilatation or surrounding inflammatory  changes. Spleen: Splenomegaly. No focal splenic lesions. No direct splenic injury or perisplenic hematoma. Adrenals/Urinary Tract: Normal adrenal glands. Kidneys are normally located with symmetric enhancementand excretion. Few subcentimeter hypoattenuating foci are too small to fully characterize. No suspicious renal lesion, urolithiasis or hydronephrosis. Traumatic or acute bladder abnormality is seen. Stomach/Bowel: Distal esophagus, stomach are unremarkable. Minimal stranding adjacent the first and second portion of the duodenum (2/65) without significant wall thickening, extraluminal gas or free fluid. No small bowel wall thickening or dilatation. No evidence of obstruction. A normal appendix is visualized. No colonic dilatation or wall thickening. Vascular/Lymphatic: No significant vascular findings are present. No enlarged abdominal or pelvic lymph nodes. Reproductive: The prostate and seminal vesicles are unremarkable. No acute abnormality of the external genitalia. Other: No abdominopelvic free fluid or free gas. No bowel containing hernias. Musculoskeletal: No acute osseous abnormality or suspicious osseous lesion. Subacute appearing left L1-L3 transverse process fractures. 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 Levocurvature of the lumbar spine, apex L4-5. Multilevel degenerative changes are present in the imaged portions of the spine. Bones of the pelvis are intact and congruent. Proximal femora are intact and normally located. IMPRESSION: 1. Subacute appearing left posterior fourth through eighth rib fractures. Adjacent thickening appears to be largely subpleural without layering effusion or pneumothorax. Some minimal adjacent ground-glass could be atelectatic related to splinting versus resolving pulmonary contusive change. 2. Subacute appearing left L1-L3 transverse process fractures. 3. Minimal stranding adjacent the first and second  portion of the duodenum without significant wall thickening, extraluminal gas or free fluid. Findings could reflect a mild duodenitis or peptic ulcer disease in the appropriate clinical setting 4. Mildly lobular hepatic surface, suggestive of cirrhosis. Splenomegaly, could be an early manifestation of portal hypertension in the setting of cirrhosis. 5. 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 Anatomic variant. These results were called by telephone at the time of interpretation on 08/21/2020 at 6:10 am to provider [facility] , who verbally acknowledged these results. Electronically Signed   By: Lovena Le M.D.   On: 08/21/2020 06:10   CT ABDOMEN PELVIS W CONTRAST  Result Date: 08/21/2020 CLINICAL DATA:  Fall from ladder 3 weeks ago, persistent back pain. EXAM: CT CHEST, ABDOMEN, AND PELVIS WITH CONTRAST TECHNIQUE: Multidetector CT imaging of the chest, abdomen and pelvis was performed following the standard protocol during bolus administration of intravenous contrast. CONTRAST:  157m OMNIPAQUE IOHEXOL 300 MG/ML  SOLN COMPARISON:  Radiographs 08/21/2020 FINDINGS: CT CHEST FINDINGS Cardiovascular: The aortic root is suboptimally assessed given cardiac pulsation artifact. The aorta is normal caliber. No acute luminal abnormality of the imaged aorta. No periaortic stranding or hemorrhage. Normal 3 vessel branching of the aortic arch. Proximal great vessels are unremarkable. Normal heart size. No pericardial effusion. Central pulmonary arteries are normal caliber no large central filling defects with more distal evaluation limited by non tailored examination. No major venous abnormalities are seen. Mediastinum/Nodes: No mediastinal fluid or gas. Normal thyroid gland and thoracic inlet. No acute abnormality of the trachea or esophagus. No  worrisome mediastinal, hilar or axillary adenopathy. Lungs/Pleura: No acute traumatic abnormality of the lung  parenchyma. Thickening adjacent the contiguous posterior left-sided rib fractures appears to be largely subpleural without layering effusion or pneumothorax. Some minimal adjacent ground-glass could be atelectatic or reflective atelectasis versus resolving pulmonary contusive change. Lungs are otherwise clear. No consolidative process or edema. Musculoskeletal: Subacute appearing left posterior fourth through eighth rib fractures. Adjacent thickening appears largely subpleural, as above. No right-sided rib fractures. No other acute traumatic osseous injuries of the chest wall or imaged thoracic spine. Mild dextrocurvature of the mid to upper thoracic levels. CT ABDOMEN PELVIS FINDINGS Hepatobiliary: No direct hepatic injury or perihepatic hematoma. Solitary subcentimeter hypoattenuating focus in the right lobe liver (8/16), statistically likely benign. No worrisome focal liver lesions. Mildly lobular hepatic surface contour. Normal hepatic attenuation. Normal gallbladder and biliary tree. No significant biliary ductal dilatation or visible gallstones. Pancreas: No pancreatic ductal dilatation or surrounding inflammatory changes. Spleen: Splenomegaly. No focal splenic lesions. No direct splenic injury or perisplenic hematoma. Adrenals/Urinary Tract: Normal adrenal glands. Kidneys are normally located with symmetric enhancementand excretion. Few subcentimeter hypoattenuating foci are too small to fully characterize. No suspicious renal lesion, urolithiasis or hydronephrosis. Traumatic or acute bladder abnormality is seen. Stomach/Bowel: Distal esophagus, stomach are unremarkable. Minimal stranding adjacent the first and second portion of the duodenum (2/65) without significant wall thickening, extraluminal gas or free fluid. No small bowel wall thickening or dilatation. No evidence of obstruction. A normal appendix is visualized. No colonic dilatation or wall thickening. Vascular/Lymphatic: No significant vascular  findings are present. No enlarged abdominal or pelvic lymph nodes. Reproductive: The prostate and seminal vesicles are unremarkable. No acute abnormality of the external genitalia. Other: No abdominopelvic free fluid or free gas. No bowel containing hernias. Musculoskeletal: No acute osseous abnormality or suspicious osseous lesion. Subacute appearing left L1-L3 transverse process fractures. 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 Levocurvature of the lumbar spine, apex L4-5. Multilevel degenerative changes are present in the imaged portions of the spine. Bones of the pelvis are intact and congruent. Proximal femora are intact and normally located. IMPRESSION: 1. Subacute appearing left posterior fourth through eighth rib fractures. Adjacent thickening appears to be largely subpleural without layering effusion or pneumothorax. Some minimal adjacent ground-glass could be atelectatic related to splinting versus resolving pulmonary contusive change. 2. Subacute appearing left L1-L3 transverse process fractures. 3. Minimal stranding adjacent the first and second portion of the duodenum without significant wall thickening, extraluminal gas or free fluid. Findings could reflect a mild duodenitis or peptic ulcer disease in the appropriate clinical setting 4. Mildly lobular hepatic surface, suggestive of cirrhosis. Splenomegaly, could be an early manifestation of portal hypertension in the setting of cirrhosis. 5. 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 Anatomic variant. These results were called by telephone at the time of interpretation on 08/21/2020 at 6:10 am to provider [facility] , who verbally acknowledged these results. Electronically Signed   By: Lovena Le M.D.   On: 08/21/2020 06:10   US Abdomen Limited RUQ (LIVER/GB)  Result Date: 08/21/2020 CLINICAL DATA:  Pain of the abdomen in  status post fall 3 weeks ago. EXAM: ULTRASOUND ABDOMEN LIMITED RIGHT UPPER QUADRANT COMPARISON:  CT abdomen pelvis August 21, 2020 FINDINGS: Gallbladder: No gallstones or wall thickening visualized. No sonographic Murphy sign noted by sonographer. Common bile duct: Diameter: 4 mm Liver: There is a 0.9 x 0.8 x 1 cm  simple cyst in the right lobe liver correlating to CT finding. Within normal limits in parenchymal echogenicity. Portal vein is patent on color Doppler imaging with normal direction of blood flow towards the liver. Other: None. IMPRESSION: No sonographic evidence of acute cholecystitis. Simple cyst right lobe liver. Electronically Signed   By: Abelardo Diesel M.D.   On: 08/21/2020 09:22    SIGNED: Deatra James, MD, FHM. [facility],  Pager (please use [email or web address] to page/text) Please use Epic Secure Chat for non-urgent communication (7AM-7PM)  If 7PM-7AM, please contact night-coverage [email or web address], 08/23/2020, 11:53 AM

## 2020-08-23 NOTE — Plan of Care (Signed)
  Problem: Activity: Goal: Risk for activity intolerance will decrease Outcome: Adequate for Discharge   Problem: Nutrition: Goal: Adequate nutrition will be maintained Outcome: Adequate for Discharge   Problem: Pain Managment: Goal: General experience of comfort will improve Outcome: Progressing   

## 2020-08-23 NOTE — Progress Notes (Signed)
Subjective: Denies confusion or mental status changes. Tolerating diet. Notes rib pain but no abdominal pain. No N/V. No overt GI bleeding. Remains afebrile.   Objective: Vital signs in last 24 hours: Temp:  [97.7 F (36.5 C)-98.5 F (36.9 C)] 98.5 F (36.9 C) (03/21 0506) Pulse Rate:  [85-92] 92 (03/21 0506) Resp:  [20] 20 (03/21 0506) BP: (114-135)/(69-79) 126/79 (03/21 0506) SpO2:  [97 %-99 %] 99 % (03/21 0506) Weight:  [101.4 kg] 101.4 kg (03/21 0506) Last BM Date: 08/22/20 General:   Alert and oriented, pleasant, jaundiced Head:  Normocephalic and atraumatic. Eyes:  +scleral icterus Abdomen:  Bowel sounds present, soft, non-tender, non-distended. No HSM or hernias noted. No rebound or guarding. No masses appreciated  Msk:  Symmetrical without gross deformities. Normal posture. Extremities:  Without edema. Neurologic:  Alert and  oriented x4. No asterixis  Psych:  Alert and cooperative. Normal mood and affect.  Intake/Output from previous day: 03/20 0701 - 03/21 0700 In: 3629.2 [P.O.:560; I.V.:3069.2] Out: 1150 [Urine:1150] Intake/Output this shift: No intake/output data recorded.  Lab Results: Recent Labs    08/21/20 0317 08/22/20 0515 08/23/20 0636  WBC 6.1 6.0 6.5  HGB 11.9* 11.3* 11.2*  HCT 31.0* 31.6* 28.1*  PLT 242 194 247   BMET Recent Labs    08/21/20 0317 08/22/20 0515 08/23/20 0636  NA 133* 132* 134*  K 3.9 4.1 4.1  CL 100 102 100  CO2 25 25 27   GLUCOSE 111* 98 104*  BUN 9 8 7   CREATININE 0.72 0.51* 0.53*  CALCIUM 8.8* 8.5* 8.9   LFT Recent Labs    08/21/20 0317 08/21/20 1036 08/22/20 0515 08/23/20 0636  PROT 7.0 6.6 6.1* 6.2*  ALBUMIN 3.0* 2.8* 2.7* 2.7*  AST 720* 704* 738* 911*  ALT 724* 703* 687* 766*  ALKPHOS 267* 251* 225* 231*  BILITOT 12.6*  12.5* 12.6* 14.6* 17.9*  BILIDIR 7.5* 7.4*  --   --   IBILI 5.1* 5.2*  --   --    PT/INR Recent Labs    08/21/20 0317 08/22/20 0515  LABPROT 13.8 14.6  INR 1.1 1.2    Hepatitis Panel Recent Labs    08/21/20 0317  HEPBSAG Reactive*  HCVAB NON REACTIVE  HEPAIGM NON REACTIVE  HEPBIGM Reactive*     Studies/Results: 08/24/20 Abdomen Limited RUQ (LIVER/GB)  Result Date: 08/21/2020 CLINICAL DATA:  Pain of the abdomen in status post fall 3 weeks ago. EXAM: ULTRASOUND ABDOMEN LIMITED RIGHT UPPER QUADRANT COMPARISON:  CT abdomen pelvis August 21, 2020 FINDINGS: Gallbladder: No gallstones or wall thickening visualized. No sonographic Murphy sign noted by sonographer. Common bile duct: Diameter: 4 mm Liver: There is a 0.9 x 0.8 x 1 cm simple cyst in the right lobe liver correlating to CT finding. Within normal limits in parenchymal echogenicity. Portal vein is patent on color Doppler imaging with normal direction of blood flow towards the liver. Other: None. IMPRESSION: No sonographic evidence of acute cholecystitis. Simple cyst right lobe liver. Electronically Signed   By: 08/23/2020 M.D.   On: 08/21/2020 09:22    Assessment: 55 year old male admitted with painless jaundice, found to have acute hepatitis B infection, new findings of likely cirrhosis, remaining well-compensated and without a fulminant course. INR normal yesterday, with repeat planned today.  Bilirubin rising, along with transaminases. Treatment for acute Hepatitis B largely supportive care, especially in light of remaining well-compensated, without coagulopathy. Will need close follow-up as outpatient for cirrhosis care and Hepatology.  Plan: INR Follow HFP/INR Monitor for mental status changes/confusion Will need close follow-up as outpatient for cirrhosis care  Avoid NSAIDs, ETOH   Gelene Mink, PhD, ANP-BC [facility]     LOS: 2 days    08/23/2020, 8:40 AM

## 2020-08-23 NOTE — TOC Progression Note (Signed)
Transition of Care [facility]) - Progression Note    Patient Details  Name: Brandon Lane MRN: [id number] Date of Birth: 26-Nov-1965  [facility] CM/SW Contact  Karn Cassis, [place] Phone Number: 08/23/2020, 11:11 AM  Clinical Narrative:  TOC received consult for PCP needs, HH/DME. Pt reports his PCP is Dr. Yetta Barre and he had an appointment with him 1-2 months ago. Per PT, no needs at this time. TOC noted pt does not have insurance. Discussed with pt who is agreeable to refer to financial counselor to see if eligible for Medicaid. Referral made. TOC will continue to follow.           Expected Discharge Plan and Services                                                 Social Determinants of Health (SDOH) Interventions    Readmission Risk Interventions No flowsheet data found.

## 2020-08-24 DIAGNOSIS — B169 Acute hepatitis B without delta-agent and without hepatic coma: Secondary | ICD-10-CM

## 2020-08-24 DIAGNOSIS — K759 Inflammatory liver disease, unspecified: Secondary | ICD-10-CM

## 2020-08-24 DIAGNOSIS — K219 Gastro-esophageal reflux disease without esophagitis: Secondary | ICD-10-CM

## 2020-08-24 DIAGNOSIS — R17 Unspecified jaundice: Secondary | ICD-10-CM

## 2020-08-24 LAB — COMPREHENSIVE METABOLIC PANEL
ALT: 746 U/L — ABNORMAL HIGH (ref 0–44)
AST: 905 U/L — ABNORMAL HIGH (ref 15–41)
Albumin: 2.6 g/dL — ABNORMAL LOW (ref 3.5–5.0)
Alkaline Phosphatase: 218 U/L — ABNORMAL HIGH (ref 38–126)
Anion gap: 8 (ref 5–15)
BUN: 8 mg/dL (ref 6–20)
CO2: 27 mmol/L (ref 22–32)
Calcium: 8.7 mg/dL — ABNORMAL LOW (ref 8.9–10.3)
Chloride: 102 mmol/L (ref 98–111)
Creatinine, Ser: 0.55 mg/dL — ABNORMAL LOW (ref 0.61–1.24)
GFR, Estimated: >60 mL/min (ref >60)
Glucose, Bld: 104 mg/dL — ABNORMAL HIGH (ref 70–99)
Potassium: 3.7 mmol/L (ref 3.5–5.1)
Sodium: 137 mmol/L (ref 135–145)
Total Bilirubin: 18.5 mg/dL (ref 0.3–1.2)
Total Protein: 6 g/dL — ABNORMAL LOW (ref 6.5–8.1)

## 2020-08-24 LAB — CBC
HCT: 30.1 % — ABNORMAL LOW (ref 39.0–52.0)
Hemoglobin: 11.1 g/dL — ABNORMAL LOW (ref 13.0–17.0)
MCH: 40.2 pg — ABNORMAL HIGH (ref 26.0–34.0)
MCHC: 36.9 g/dL — ABNORMAL HIGH (ref 30.0–36.0)
MCV: 109.1 fL — ABNORMAL HIGH (ref 80.0–100.0)
Platelets: 227 10*3/uL (ref 150–400)
RBC: 2.76 MIL/uL — ABNORMAL LOW (ref 4.22–5.81)
RDW: 19.9 % — ABNORMAL HIGH (ref 11.5–15.5)
WBC: 5.6 10*3/uL (ref 4.0–10.5)
nRBC: 0 % (ref 0.0–0.2)

## 2020-08-24 LAB — PROTIME-INR
INR: 1.1 (ref 0.8–1.2)
Prothrombin Time: 13.7 seconds (ref 11.4–15.2)

## 2020-08-24 LAB — GLUCOSE, CAPILLARY: Glucose-Capillary: 101 mg/dL — ABNORMAL HIGH (ref 70–99)

## 2020-08-24 NOTE — Progress Notes (Signed)
PROGRESS NOTE    Patient: Brandon Lane                            PCP: Janith Lima, MD                    DOB: 10/25/1965            DOA: 08/21/2020 UDJ:[id number]             DOS: 08/24/2020, 3:55 PM   LOS: 3 days   Date of Service: The patient was seen and examined on 08/24/2020  Subjective:   The patient was seen and examined this morning, he is more jaundiced, icteric... No signs of confusion agitation or  asterixis Not complaining of nausea vomiting or abdominal pain  Brief Narrative:   Brandon Lane is a 55 y.o. male with medical history significant of with past medical history of GERD, OSA, history of syphilis, hypertension, depression, DDD, status post recent fall from a ladder approximately 4 weeks ago, falling backwards hitting around 4 to 5 feet on his back presenting today with pain discomfort and yellowish of skin. Describes the fall as accidental, did not hit his head or loss consciousness.-Now experiencing left upper back, rib pain no radiation worse with movement and deep breathing. Admit that he has been taken ibuprofen and Tylenol without relief. Per patient and family he is more yellowish in skin, which has gotten worse and over past few days.  Patient Denies having: Fever, Chills, Cough, SOB, Chest Pain, Abd pain, N/V/D, headache, dizziness, lightheadedness,  Dysuria, Joint pain, rash, open wounds.. Denies of having any weakness in his his legs or arms.  ED Course:   Vitals: Medically stable, Abnormal labs; elevated LFTs AST 720, ALT 724, alk phos 267, total bilirubin 12.5, lipase 60, Tylenol level < 10 Chest x-ray multiple rib fractures negative for pneumothorax, EKG normal sinus rhythm, CT abdomen pelvis chest reviewed consistent with rib fractures, subacute lower lumbar fractures, CT did not reveal any obstruction-inflammation of the duodenum and liver nodules identified, subacute left-sided rib fractures and lumbar spine transverse process  fractures    Assessment & Plan:   Principal Problem:   Hyperbilirubinemia Active Problems:   Hepatitis   Closed rib fracture   OSA (obstructive sleep apnea)   Essential hypertension, benign   GERD (gastroesophageal reflux disease)   Class 1 obesity due to excess calories with serious comorbidity and body mass index (BMI) of 33.0 to 33.9 in adult   Secondary syphilis in male   Acute hepatitis B virus infection   Jaundice   Hyperbilirubinemia -jaundice Likely due to hepatitis B infection  -Once again worsening bilirubin -Continue to monitor closely, currently not complaining of confusion headaches abdominal pain or asterixis -Monitoring LFTs-some improvement noted  CT abdomen pelvis -reviewed: Revealing Mildly lobular hepatic surface, suggestive of cirrhosis. Splenomegaly, could be an early manifestation of portal hypertension in the setting of cirrhosis.  Imaging studies including abdominal ultrasound - does not reveal any signs of obstruction at this time, therefore no need for MRCP, -Gastroenterology consult, appreciate their input -Abdominal ultrasound -reviewed negative for any obstructions -Total bilirubin 12.5 >> 14.6 >>> 17.9 >>>18.5  -Direct bilirubin 7.4, indirect 5.2  -Continue IV fluid hydration  Acute hepatitis B infection  Hepatitis B IgM positive, pending IgG -Continues to have elevated LFTs:  Alk phos 267 >> 225 >> 231 >> 218 AST 720 >> 738 >> 911 >>905 ALT 724 >>  687 >> 766 >>746  Lipase 60 -INR 1.0, 1.1, -We will avoid hepatotoxic -GI following recommending outpatient follow-up with hepatologist Appreciate further evaluation by GI and input    Closed rib fracture  -incentive spirometer, needed analgesics -Continue to complain of pain but improved with pain medication - Status post recent fall, sustaining rib fractures -CT scan reviewed -revealing left posterior 4th - 8th rib fractures.  Lumbar fracture CT abdomen pelvis revealing -Subacute  appearing left L1-L3 transverse fractures As needed analgesics, PT OT evaluation -Remained stable  Active Problems:  GERD (gastroesophageal reflux disease)/esophagitis -once again apparent on CT scan, continue PPI -referral to GI for EGD  OSA (obstructive sleep apnea) -outpatient referral for sleep study, does not supply oxygen with CPAP at home   Essential hypertension, benign - BP remained stable - not on any home medication at this time   Class 1 obesity due to excess calories with serious comorbidity and body mass index (BMI) of 33.0 to 33.9 in adult -consulted regarding weight loss  H/o  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     History of depression -resuming home medication of Effexor  Cultures:  -none  Antimicrobial: -none   Consults called:   Gastroenterologist -------------------------------------------------------------------------------------------------------------------------------------------- DVT prophylaxis: SCD/Compression stockings and Heparin SQ Code Status:   Code Status: Full Code Admission status:  Admitted as Inpatient,  Level of care: Med-Surg Family Communication:   Discussed with patient and his wife  (The above findings and plan of care has been discussed with patient in detail, the patient expressed understanding and agreement of above plan)  --------------------------------------------------------------------------------------------------------------------------------------------------  Disposition Plan: 1-2 days Status is: Inpatient  Remains inpatient appropriate because:Inpatient level of care appropriate due to severity of illness   Dispo: The patient is from: Home  Anticipated d/c is to: Home in next 24-48 hours -pending trending down LFTs, total bilirubin  Patient currently is not medically stable to d/c.              Difficult to place patient  No      ----------------------------------------------------------------------------------------------------------------------------------------------- Nutritional status:  The patient's BMI is: Body mass index is 33.01 kg/m. I agree with the assessment and plan as outlined    Procedures:   No admission procedures for hospital encounter.     Antimicrobials:  Anti-infectives (From admission, onward)   None       Medication:  . heparin  5,000 Units Subcutaneous Q8H  . multivitamin with minerals  1 tablet Oral Daily  . senna  1 tablet Oral BID  . sodium chloride flush  3 mL Intravenous Q12H  . sodium chloride flush  3 mL Intravenous Q12H  . venlafaxine XR  150 mg Oral Q breakfast    sodium chloride, alum & mag hydroxide-simeth, bisacodyl, hydrALAZINE, HYDROmorphone (DILAUDID) injection, hydrOXYzine, ibuprofen, ipratropium, levalbuterol, magnesium citrate, methocarbamol (ROBAXIN) IV, ondansetron (ZOFRAN) IV, oxyCODONE, senna-docusate, sodium chloride flush, traZODone   Objective:   Vitals:   08/23/20 1402 08/23/20 2107 08/24/20 0516 08/24/20 1521  BP: 129/81 108/67 105/72 113/76  Pulse: 87 76 89 71  Resp: $Remo'20 20 20 20  'xcfVl$ Temp: 97.6 F (36.4 C) 98.2 F (36.8 C) 98.3 F (36.8 C) 98.3 F (36.8 C)  TempSrc: Oral Oral Oral Oral  SpO2: 99% 98% 95% 100%  Weight:      Height:        Intake/Output Summary (Last 24 hours) at 08/24/2020 1555 Last data filed at 08/24/2020 0900 Gross per 24 hour  Intake 3132.74 ml  Output --  Net 3132.74  ml   Filed Weights   08/21/20 0049 08/21/20 0854 08/23/20 0506  Weight: 106.6 kg 101.2 kg 101.4 kg     Examination:    Physical Exam:   General:  Alert, oriented, cooperative, no distress;   HEENT:  Normocephalic, PERRL, otherwise with in Normal limits, icteric sclera  Neuro:  CNII-XII intact. , normal motor and sensation, reflexes intact   Lungs:   Clear to auscultation BL, Respirations unlabored, no wheezes / crackles   Cardio:    S1/S2, RRR, No murmure, No Rubs or Gallops   Abdomen:   Soft, non-tender, bowel sounds active all four quadrants,  no guarding or peritoneal signs.  Muscular skeletal:  Limited exam - in bed, able to move all 4 extremities, Normal strength,  2+ pulses,  symmetric, No pitting edema  Skin:   Diffuse worsening jaundice, dry, warm to touch, negative for any Rashes,  Wounds: Please see nursing documentation        ------------------------------------------------------------------------------------------------------------------------------------------    LABs:  CBC Latest Ref Rng & Units 08/24/2020 08/23/2020 08/22/2020  WBC 4.0 - 10.5 K/uL 5.6 6.5 6.0  Hemoglobin 13.0 - 17.0 g/dL 11.1(L) 11.2(L) 11.3(L)  Hematocrit 39.0 - 52.0 % 30.1(L) 28.1(L) 31.6(L)  Platelets 150 - 400 K/uL 227 247 194   CMP Latest Ref Rng & Units 08/24/2020 08/23/2020 08/22/2020  Glucose 70 - 99 mg/dL 104(H) 104(H) 98  BUN 6 - 20 mg/dL $Remove'8 7 8  'tVXkoyM$ Creatinine 0.61 - 1.24 mg/dL 0.55(L) 0.53(L) 0.51(L)  Sodium 135 - 145 mmol/L 137 134(L) 132(L)  Potassium 3.5 - 5.1 mmol/L 3.7 4.1 4.1  Chloride 98 - 111 mmol/L 102 100 102  CO2 22 - 32 mmol/L $RemoveB'27 27 25  'bOGlZGTt$ Calcium 8.9 - 10.3 mg/dL 8.7(L) 8.9 8.5(L)  Total Protein 6.5 - 8.1 g/dL 6.0(L) 6.2(L) 6.1(L)  Total Bilirubin 0.3 - 1.2 mg/dL 18.5(HH) 17.9(H) 14.6(H)  Alkaline Phos 38 - 126 U/L 218(H) 231(H) 225(H)  AST 15 - 41 U/L 905(H) 911(H) 738(H)  ALT 0 - 44 U/L 746(H) 766(H) 687(H)       Micro Results Recent Results (from the past 240 hour(s))  Resp Panel by RT-PCR (Flu A&B, Covid) Nasopharyngeal Swab     Status: None   Collection Time: 08/21/20  4:26 AM   Specimen: Nasopharyngeal Swab; Nasopharyngeal(NP) swabs in vial transport medium  Result Value Ref Range Status   SARS Coronavirus 2 by RT PCR NEGATIVE NEGATIVE Final    Comment: (NOTE) SARS-CoV-2 target nucleic acids are NOT DETECTED.  The SARS-CoV-2 RNA is generally detectable in upper respiratory specimens  during the acute phase of infection. The lowest concentration of SARS-CoV-2 viral copies this assay can detect is 138 copies/mL. A negative result does not preclude SARS-Cov-2 infection and should not be used as the sole basis for treatment or other patient management decisions. A negative result may occur with  improper specimen collection/handling, submission of specimen other than nasopharyngeal swab, presence of viral mutation(s) within the areas targeted by this assay, and inadequate number of viral copies(<138 copies/mL). A negative result must be combined with clinical observations, patient history, and epidemiological information. The expected result is Negative.  Fact Sheet for Patients:  EntrepreneurPulse.com.au  Fact Sheet for Healthcare Providers:  IncredibleEmployment.be  This test is no t yet approved or cleared by the [place] FDA and  has been authorized for detection and/or diagnosis of SARS-CoV-2 by FDA under an Emergency Use Authorization (EUA). This EUA will remain  in effect (meaning this test can be used) for  the duration of the COVID-19 declaration under Section 564(b)(1) of the Act, 21 U.S.C.section 360bbb-3(b)(1), unless the authorization is terminated  or revoked sooner.       Influenza A by PCR NEGATIVE NEGATIVE Final   Influenza B by PCR NEGATIVE NEGATIVE Final    Comment: (NOTE) The Xpert Xpress SARS-CoV-2/FLU/RSV plus assay is intended as an aid in the diagnosis of influenza from Nasopharyngeal swab specimens and should not be used as a sole basis for treatment. Nasal washings and aspirates are unacceptable for Xpert Xpress SARS-CoV-2/FLU/RSV testing.  Fact Sheet for Patients: EntrepreneurPulse.com.au  Fact Sheet for Healthcare Providers: IncredibleEmployment.be  This test is not yet approved or cleared by the [place] FDA and has been authorized for detection  and/or diagnosis of SARS-CoV-2 by FDA under an Emergency Use Authorization (EUA). This EUA will remain in effect (meaning this test can be used) for the duration of the COVID-19 declaration under Section 564(b)(1) of the Act, 21 U.S.C. section 360bbb-3(b)(1), unless the authorization is terminated or revoked.  Performed at [facility], [address] [place], [place] [address]     Radiology Reports DG Ribs Unilateral W/Chest Left  Result Date: 08/21/2020 CLINICAL DATA:  55 year old male with fall and left chest wall pain. EXAM: LEFT RIBS AND CHEST - 3+ VIEW COMPARISON:  None. FINDINGS: There multiple mildly displaced left posterior rib fractures involving the fourth-eighth ribs. No focal consolidation, pleural effusion, pneumothorax. The cardiac silhouette is within limits. IMPRESSION: Multiple mildly displaced left posterior rib fractures. No pneumothorax. Electronically Signed   By: Anner Crete M.D.   On: 08/21/2020 02:29   DG Thoracic Spine 2 View  Result Date: 08/21/2020 CLINICAL DATA:  55 year old male with fall. EXAM: THORACIC SPINE 2 VIEWS COMPARISON:  Chest radiograph dated 08/21/2020. FINDINGS: There is no acute fracture or subluxation of the thoracic spine. Multilevel degenerative changes and mild chronic compression fractures. Partially visualized multiple left posterior rib fractures. The soft tissues are unremarkable. IMPRESSION: 1. No acute fracture or subluxation of the thoracic spine. 2. Left posterior rib fractures. Electronically Signed   By: Anner Crete M.D.   On: 08/21/2020 02:31   CT Chest W Contrast  Result Date: 08/21/2020 CLINICAL DATA:  Fall from ladder 3 weeks ago, persistent back pain. EXAM: CT CHEST, ABDOMEN, AND PELVIS WITH CONTRAST TECHNIQUE: Multidetector CT imaging of the chest, abdomen and pelvis was performed following the standard protocol during bolus administration of intravenous contrast. CONTRAST:  147mL OMNIPAQUE IOHEXOL 300 MG/ML  SOLN  COMPARISON:  Radiographs 08/21/2020 FINDINGS: CT CHEST FINDINGS Cardiovascular: The aortic root is suboptimally assessed given cardiac pulsation artifact. The aorta is normal caliber. No acute luminal abnormality of the imaged aorta. No periaortic stranding or hemorrhage. Normal 3 vessel branching of the aortic arch. Proximal great vessels are unremarkable. Normal heart size. No pericardial effusion. Central pulmonary arteries are normal caliber no large central filling defects with more distal evaluation limited by non tailored examination. No major venous abnormalities are seen. Mediastinum/Nodes: No mediastinal fluid or gas. Normal thyroid gland and thoracic inlet. No acute abnormality of the trachea or esophagus. No worrisome mediastinal, hilar or axillary adenopathy. Lungs/Pleura: No acute traumatic abnormality of the lung parenchyma. Thickening adjacent the contiguous posterior left-sided rib fractures appears to be largely subpleural without layering effusion or pneumothorax. Some minimal adjacent ground-glass could be atelectatic or reflective atelectasis versus resolving pulmonary contusive change. Lungs are otherwise clear. No consolidative process or edema. Musculoskeletal: Subacute appearing left posterior fourth through eighth rib fractures. Adjacent thickening appears  largely subpleural, as above. No right-sided rib fractures. No other acute traumatic osseous injuries of the chest wall or imaged thoracic spine. Mild dextrocurvature of the mid to upper thoracic levels. CT ABDOMEN PELVIS FINDINGS Hepatobiliary: No direct hepatic injury or perihepatic hematoma. Solitary subcentimeter hypoattenuating focus in the right lobe liver (8/16), statistically likely benign. No worrisome focal liver lesions. Mildly lobular hepatic surface contour. Normal hepatic attenuation. Normal gallbladder and biliary tree. No significant biliary ductal dilatation or visible gallstones. Pancreas: No pancreatic ductal dilatation  or surrounding inflammatory changes. Spleen: Splenomegaly. No focal splenic lesions. No direct splenic injury or perisplenic hematoma. Adrenals/Urinary Tract: Normal adrenal glands. Kidneys are normally located with symmetric enhancementand excretion. Few subcentimeter hypoattenuating foci are too small to fully characterize. No suspicious renal lesion, urolithiasis or hydronephrosis. Traumatic or acute bladder abnormality is seen. Stomach/Bowel: Distal esophagus, stomach are unremarkable. Minimal stranding adjacent the first and second portion of the duodenum (2/65) without significant wall thickening, extraluminal gas or free fluid. No small bowel wall thickening or dilatation. No evidence of obstruction. A normal appendix is visualized. No colonic dilatation or wall thickening. Vascular/Lymphatic: No significant vascular findings are present. No enlarged abdominal or pelvic lymph nodes. Reproductive: The prostate and seminal vesicles are unremarkable. No acute abnormality of the external genitalia. Other: No abdominopelvic free fluid or free gas. No bowel containing hernias. Musculoskeletal: No acute osseous abnormality or suspicious osseous lesion. Subacute appearing left L1-L3 transverse process fractures. 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 Levocurvature of the lumbar spine, apex L4-5. Multilevel degenerative changes are present in the imaged portions of the spine. Bones of the pelvis are intact and congruent. Proximal femora are intact and normally located. IMPRESSION: 1. Subacute appearing left posterior fourth through eighth rib fractures. Adjacent thickening appears to be largely subpleural without layering effusion or pneumothorax. Some minimal adjacent ground-glass could be atelectatic related to splinting versus resolving pulmonary contusive change. 2. Subacute appearing left L1-L3 transverse process fractures. 3. Minimal stranding  adjacent the first and second portion of the duodenum without significant wall thickening, extraluminal gas or free fluid. Findings could reflect a mild duodenitis or peptic ulcer disease in the appropriate clinical setting 4. Mildly lobular hepatic surface, suggestive of cirrhosis. Splenomegaly, could be an early manifestation of portal hypertension in the setting of cirrhosis. 5. 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 Anatomic variant. These results were called by telephone at the time of interpretation on 08/21/2020 at 6:10 am to provider [facility] , who verbally acknowledged these results. Electronically Signed   By: Kreg Shropshire M.D.   On: 08/21/2020 06:10   CT ABDOMEN PELVIS W CONTRAST  Result Date: 08/21/2020 CLINICAL DATA:  Fall from ladder 3 weeks ago, persistent back pain. EXAM: CT CHEST, ABDOMEN, AND PELVIS WITH CONTRAST TECHNIQUE: Multidetector CT imaging of the chest, abdomen and pelvis was performed following the standard protocol during bolus administration of intravenous contrast. CONTRAST:  OMNIPAQUE IOHEXOL 300 MG/ML  SOLN COMPARISON:  Radiographs 08/21/2020 FINDINGS: CT CHEST FINDINGS Cardiovascular: The aortic root is suboptimally assessed given cardiac pulsation artifact. The aorta is normal caliber. No acute luminal abnormality of the imaged aorta. No periaortic stranding or hemorrhage. Normal 3 vessel branching of the aortic arch. Proximal great vessels are unremarkable. Normal heart size. No pericardial effusion. Central pulmonary arteries are normal caliber no large central filling defects with more distal evaluation limited by non tailored examination. No major venous abnormalities  are seen. Mediastinum/Nodes: No mediastinal fluid or gas. Normal thyroid gland and thoracic inlet. No acute abnormality of the trachea or esophagus. No worrisome mediastinal, hilar or axillary adenopathy. Lungs/Pleura: No acute  traumatic abnormality of the lung parenchyma. Thickening adjacent the contiguous posterior left-sided rib fractures appears to be largely subpleural without layering effusion or pneumothorax. Some minimal adjacent ground-glass could be atelectatic or reflective atelectasis versus resolving pulmonary contusive change. Lungs are otherwise clear. No consolidative process or edema. Musculoskeletal: Subacute appearing left posterior fourth through eighth rib fractures. Adjacent thickening appears largely subpleural, as above. No right-sided rib fractures. No other acute traumatic osseous injuries of the chest wall or imaged thoracic spine. Mild dextrocurvature of the mid to upper thoracic levels. CT ABDOMEN PELVIS FINDINGS Hepatobiliary: No direct hepatic injury or perihepatic hematoma. Solitary subcentimeter hypoattenuating focus in the right lobe liver (8/16), statistically likely benign. No worrisome focal liver lesions. Mildly lobular hepatic surface contour. Normal hepatic attenuation. Normal gallbladder and biliary tree. No significant biliary ductal dilatation or visible gallstones. Pancreas: No pancreatic ductal dilatation or surrounding inflammatory changes. Spleen: Splenomegaly. No focal splenic lesions. No direct splenic injury or perisplenic hematoma. Adrenals/Urinary Tract: Normal adrenal glands. Kidneys are normally located with symmetric enhancementand excretion. Few subcentimeter hypoattenuating foci are too small to fully characterize. No suspicious renal lesion, urolithiasis or hydronephrosis. Traumatic or acute bladder abnormality is seen. Stomach/Bowel: Distal esophagus, stomach are unremarkable. Minimal stranding adjacent the first and second portion of the duodenum (2/65) without significant wall thickening, extraluminal gas or free fluid. No small bowel wall thickening or dilatation. No evidence of obstruction. A normal appendix is visualized. No colonic dilatation or wall thickening.  Vascular/Lymphatic: No significant vascular findings are present. No enlarged abdominal or pelvic lymph nodes. Reproductive: The prostate and seminal vesicles are unremarkable. No acute abnormality of the external genitalia. Other: No abdominopelvic free fluid or free gas. No bowel containing hernias. Musculoskeletal: No acute osseous abnormality or suspicious osseous lesion. Subacute appearing left L1-L3 transverse process fractures. 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 Levocurvature of the lumbar spine, apex L4-5. Multilevel degenerative changes are present in the imaged portions of the spine. Bones of the pelvis are intact and congruent. Proximal femora are intact and normally located. IMPRESSION: 1. Subacute appearing left posterior fourth through eighth rib fractures. Adjacent thickening appears to be largely subpleural without layering effusion or pneumothorax. Some minimal adjacent ground-glass could be atelectatic related to splinting versus resolving pulmonary contusive change. 2. Subacute appearing left L1-L3 transverse process fractures. 3. Minimal stranding adjacent the first and second portion of the duodenum without significant wall thickening, extraluminal gas or free fluid. Findings could reflect a mild duodenitis or peptic ulcer disease in the appropriate clinical setting 4. Mildly lobular hepatic surface, suggestive of cirrhosis. Splenomegaly, could be an early manifestation of portal hypertension in the setting of cirrhosis. 5. 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 Anatomic variant. These results were called by telephone at the time of interpretation on 08/21/2020 at 6:10 am to provider [facility] , who verbally acknowledged these results. Electronically Signed   By: Lovena Le M.D.   On: 08/21/2020 06:10   US Abdomen Limited RUQ (LIVER/GB)  Result Date:  08/21/2020 CLINICAL DATA:  Pain of the abdomen in status post fall 3 weeks ago. EXAM: ULTRASOUND ABDOMEN LIMITED RIGHT UPPER QUADRANT COMPARISON:  CT abdomen pelvis August 21, 2020 FINDINGS: Gallbladder: No gallstones or wall thickening visualized.  No sonographic Murphy sign noted by sonographer. Common bile duct: Diameter: 4 mm Liver: There is a 0.9 x 0.8 x 1 cm simple cyst in the right lobe liver correlating to CT finding. Within normal limits in parenchymal echogenicity. Portal vein is patent on color Doppler imaging with normal direction of blood flow towards the liver. Other: None. IMPRESSION: No sonographic evidence of acute cholecystitis. Simple cyst right lobe liver. Electronically Signed   By: Abelardo Diesel M.D.   On: 08/21/2020 09:22    SIGNED: Deatra James, MD, FHM. [facility],  Pager (please use [email or web address] to page/text) Please use Epic Secure Chat for non-urgent communication (7AM-7PM)  If 7PM-7AM, please contact night-coverage [email or web address], 08/24/2020, 3:55 PM

## 2020-08-24 NOTE — Progress Notes (Signed)
Subjective: Continues to have mild rib pain. No GI complaints. Remains afebrile. Tolerating his diet well. Denies abdominal pain, nausea, vomiting, change in mental status, swelling in the abdomen or LE. Bowels have been moving well. No brbpr or melena. No significant GERD although history of this. He is not currently on a PPI and states he prefers to monitor for now.   Patient is not sure where he may have contracted Hep B. He does admit to having relations with someone last year though this was more than 6 months ago. No known exposures recently. Wife has never been evaluated for Hep B.    Objective: Vital signs in last 24 hours: Temp:  [97.6 F (36.4 C)-98.3 F (36.8 C)] 98.3 F (36.8 C) (03/22 0516) Pulse Rate:  [76-89] 89 (03/22 0516) Resp:  [20] 20 (03/22 0516) BP: (105-129)/(67-81) 105/72 (03/22 0516) SpO2:  [95 %-99 %] 95 % (03/22 0516) Last BM Date: 08/22/20 General:   Alert and oriented, pleasant, jaundiced. Head:  Normocephalic and atraumatic. Eyes:  With scleral icterus.   Abdomen:  Obese abdomen, but soft, and non-tender. Bowel sounds are present. No HSM or hernias noted. No rebound or guarding. No masses appreciated  Msk:  Symmetrical without gross deformities.  Extremities:  Without edema. Neurologic:  Alert and  oriented x4;  grossly normal neurologically. Skin:  Warm and dry, intact without significant lesions.  Psych: Normal mood and affect.  Intake/Output from previous day: 03/21 0701 - 03/22 0700 In: 3372.7 [P.O.:960; I.V.:2412.7] Out: -  Intake/Output this shift: No intake/output data recorded.  Lab Results: Recent Labs    08/22/20 0515 08/23/20 0636 08/24/20 0613  WBC 6.0 6.5 5.6  HGB 11.3* 11.2* 11.1*  HCT 31.6* 28.1* 30.1*  PLT 194 247 227   BMET Recent Labs    08/22/20 0515 08/23/20 0636 08/24/20 0613  NA 132* 134* 137  K 4.1 4.1 3.7  CL 102 100 102  CO2 _0 GLUCOSE 98 104* 104*  BUN _1 CREATININE 0.51* 0.53* 0.55*   CALCIUM 8.5* 8.9 8.7*   LFT Recent Labs    08/21/20 1036 08/22/20 0515 08/23/20 0636 08/24/20 0613  PROT 6.6 6.1* 6.2* 6.0*  ALBUMIN 2.8* 2.7* 2.7* 2.6*  AST 704* 738* 911* 905*  ALT 703* 687* 766* 746*  ALKPHOS 251* 225* 231* 218*  BILITOT 12.6* 14.6* 17.9* 18.5*  BILIDIR 7.4*  --   --   --   IBILI 5.2*  --   --   --    PT/INR Recent Labs    08/22/20 0515 08/23/20 1237  LABPROT 14.6 13.1  INR 1.2 1.0   Assessment: 55 year old male admitted with painless jaundice, found to have acute hepatitis B infection, new findings of likely cirrhosis on CT, remaining well-compensated and without a fulminant course.   LFTs appear to be plateauing.  On admission, AST 720, ALT 724, alk phos 267, total bilirubin 12.5.  Yesterday, AST 911, ALT 766, alk phos 231, total bilirubin 17.9.  Today, AST 905, ALT 746, alk phos 218, total bilirubin 18.5. May see bilirubin rise a bit more before it plateaus. INR remains within normal limits.   Treatment for acute Hepatitis B largely supportive care, especially in light of remaining well-compensated, without coagulopathy. He will need close follow-up as outpatient for cirrhosis care and ongoing monitoring of LFTs, INR, and Hep B status to determine if patient will end up with chronic Hep B with need for treatment.   Etiology of  cirrhosis is not entirely clear. May be secondary to fatty liver. This can be explored further as an outpatient.   GERD: Chronic. Minimal symptoms since admission. Not on a PPI. Prefers to monitor for now and work on dietary adjustments.   Plan: 1.  Continue to follow HFP and INR daily.  2.  Monitor for mental status changes/confusion.  3.  Avoid all ETOH.  4.  Avoid NSAIDs.  5.  Recommend patient abstain from sexual intercourse or use of protection though risk of exposure is still present with protection.  6.  Patients wife should be tested for Hep B through PCP to evaluate for past/present Hep B infection and for immunity.  She should receive vaccination if she is not immune.  7.  Hopeful discharge in the next 24-48 hours if LFTs remain stable/improving.  8.  Will need close follow-up as outpatient for cirrhosis care and ongoing monitoring of LFTs, INR, and Hep B infection.    LOS: 3 days    08/24/2020, 7:50 AM   Aliene Altes, [facility]

## 2020-08-25 DIAGNOSIS — S2232XD Fracture of one rib, left side, subsequent encounter for fracture with routine healing: Secondary | ICD-10-CM

## 2020-08-25 DIAGNOSIS — I10 Essential (primary) hypertension: Secondary | ICD-10-CM

## 2020-08-25 DIAGNOSIS — A5149 Other secondary syphilitic conditions: Secondary | ICD-10-CM

## 2020-08-25 DIAGNOSIS — Z6833 Body mass index (BMI) 33.0-33.9, adult: Secondary | ICD-10-CM

## 2020-08-25 DIAGNOSIS — E6609 Other obesity due to excess calories: Secondary | ICD-10-CM

## 2020-08-25 DIAGNOSIS — R17 Unspecified jaundice: Secondary | ICD-10-CM

## 2020-08-25 LAB — COMPREHENSIVE METABOLIC PANEL
ALT: 731 U/L — ABNORMAL HIGH (ref 0–44)
AST: 900 U/L — ABNORMAL HIGH (ref 15–41)
Albumin: 2.5 g/dL — ABNORMAL LOW (ref 3.5–5.0)
Alkaline Phosphatase: 200 U/L — ABNORMAL HIGH (ref 38–126)
Anion gap: 8 (ref 5–15)
BUN: 8 mg/dL (ref 6–20)
CO2: 23 mmol/L (ref 22–32)
Calcium: 8.7 mg/dL — ABNORMAL LOW (ref 8.9–10.3)
Chloride: 105 mmol/L (ref 98–111)
Creatinine, Ser: 0.45 mg/dL — ABNORMAL LOW (ref 0.61–1.24)
GFR, Estimated: >60 mL/min (ref >60)
Glucose, Bld: 85 mg/dL (ref 70–99)
Potassium: 3.9 mmol/L (ref 3.5–5.1)
Sodium: 136 mmol/L (ref 135–145)
Total Bilirubin: 18.7 mg/dL (ref 0.3–1.2)
Total Protein: 5.9 g/dL — ABNORMAL LOW (ref 6.5–8.1)

## 2020-08-25 LAB — CBC
Hemoglobin: 10.3 g/dL — ABNORMAL LOW (ref 13.0–17.0)
Platelets: 214 10*3/uL (ref 150–400)
WBC: 7.2 10*3/uL (ref 4.0–10.5)
nRBC: 0 % (ref 0.0–0.2)

## 2020-08-25 LAB — GLUCOSE, CAPILLARY
Glucose-Capillary: 73 mg/dL (ref 70–99)
Glucose-Capillary: 105 mg/dL — ABNORMAL HIGH (ref 70–99)

## 2020-08-25 NOTE — Progress Notes (Signed)
Date and time results received: 08/25/20 @ 0735   Test: total bilirubin Critical Value: 18.7  Name of Provider Notified: Tat  Orders Received? Or Actions Taken?: None at this time

## 2020-08-25 NOTE — Progress Notes (Signed)
Subjective: No significant complaints today.  Denies abdominal pain, nausea, vomiting.  Discussed again the need for his wife to be screened/tested and vaccinated, if appropriate.  Also discussed sexual abstinence/protection Infectious.  Discussed it is highly likely he will be discharged before his liver enzymes become normal but that we would continue to follow him as an outpatient.  No other overt GI complaints.  Objective: Vital signs in last 24 hours: Temp:  [98 F (36.7 C)-98.3 F (36.8 C)] 98 F (36.7 C) (03/23 0407) Pulse Rate:  [71-89] 77 (03/23 0407) Resp:  [20] 20 (03/23 0407) BP: (113-129)/(73-76) 129/73 (03/23 0407) SpO2:  [96 %-100 %] 99 % (03/23 0407) Last BM Date: 08/25/20 General:   Alert and oriented, pleasant Head:  Normocephalic and atraumatic. Eyes:  Scleral icterus noted Heart:  S1, S2 present, no murmurs noted.  Lungs: Clear to auscultation bilaterally, without wheezing, rales, or rhonchi.  Abdomen:  Bowel sounds present, soft, non-tender, non-distended. No HSM or hernias noted. No rebound or guarding. No masses appreciated.  Appears jaundiced Msk:  Symmetrical without gross deformities. Pulses:  Normal bilateral DP pulses noted. Extremities:  Without clubbing or edema. Neurologic:  Alert and  oriented x4;  grossly normal neurologically. Psych:  Alert and cooperative. Normal mood and affect.  Intake/Output from previous day: 03/22 0701 - 03/23 0700 In: 4325.5 [P.O.:720; I.V.:3605.5] Out: -  Intake/Output this shift: No intake/output data recorded.  Lab Results: Recent Labs    08/23/20 0636 08/24/20 0613 08/25/20 0606  WBC 6.5 5.6 7.2  HGB 11.2* 11.1* 10.3*  HCT 28.1* 30.1* RESULTS UNAVAILABLE DUE TO INTERFERING SUBSTANCE  PLT 247 227 214   BMET Recent Labs    08/23/20 0636 08/24/20 0613 08/25/20 0606  NA 134* 137 136  K 4.1 3.7 3.9  CL 100 102 105  CO2 $Re'27 27 23  'OJi$ GLUCOSE 104* 104* 85  BUN $Re'7 8 8  'mKk$ CREATININE 0.53* 0.55* 0.45*   CALCIUM 8.9 8.7* 8.7*   LFT Recent Labs    08/23/20 0636 08/24/20 0613 08/25/20 0606  PROT 6.2* 6.0* 5.9*  ALBUMIN 2.7* 2.6* 2.5*  AST 911* 905* 900*  ALT 766* 746* 731*  ALKPHOS 231* 218* 200*  BILITOT 17.9* 18.5* 18.7*   PT/INR Recent Labs    08/23/20 1237 08/24/20 0807  LABPROT 13.1 13.7  INR 1.0 1.1   Hepatitis Panel No results for input(s): HEPBSAG, HCVAB, HEPAIGM, HEPBIGM in the last 72 hours.   Studies/Results: No results found.  Assessment: 55 year old male admitted with painless jaundice, found to have acute hepatitis B infection, new findings of likely cirrhosis on CT, remaining well-compensated and without a fulminant course.   LFTs appear to be plateauing.  On admission, AST 720, ALT 724, alk phos 267, total bilirubin 12.5.  Yesterday labs seemed to plateau at AST 905, ALT 746, alk phos 218, total bilirubin 18.5. Today improved slightly at AST/ALT 900/731, alk phos 200, TBili 18.7. May see bilirubin rise a bit more before it plateaus. INR remains within normal limits.  Treatment for acute Hepatitis B largely supportive care, especially in light of remaining well-compensated, without coagulopathy. He will need close follow-up as outpatient for cirrhosis care and ongoing monitoring of LFTs, INR, and Hep B status to determine if patient will end up with chronic Hep B with need for treatment. Reassuring that he will likely spontaneously clear his infection.  Question cirrhosis: Dr. Jenetta Downer reviewed images from CT (initially called slightly nodular border) but found that his spleen measured 14.3 cm  and liver does not appear to be nodular nor any caudate lobe hypertrophy.  He is also lacking biochemical evidence to suggest cirrhosis and overall feels less likely that he has liver cirrhosis will need further outpatient evaluation.  Also reassuring is a right upper quadrant ultrasound 08/21/2020 that found a simple cyst in the right lobe, within normal limits and  parenchymal echogenicity.  GERD: Chronic. Minimal symptoms since admission. Not on a PPI. Prefers to monitor for now and work on dietary adjustments.   Plan: 1. Recommend follow-up as outpatient to trend labs and plan for further liver work-up 2. Continue to trend labs daily in the interim while inpatient 3. Stressed importance of his wife being tested for hepatitis B 4. His wife will need to be vaccinated, if appropriate based on serological results 5. Avoid all alcohol for now 6. Avoid NSAIDs 7. Abstain from sexual intercourse for use protection due to risk of spreading the infection 8. Anticipate discharge in the next 24-48 hours if LFTs remain stable/continue to improve 9. Supportive care   Thank you for allowing [place] to participate in the care of Brandon Rhody, DNP, AGNP-C Adult & Gerontological Nurse Practitioner [facility]    LOS: 4 days    08/25/2020, 2:01 PM

## 2020-08-25 NOTE — Progress Notes (Signed)
PROGRESS NOTE  Brandon Lane OVF:[id number] DOB: April 19, 1966 DOA: 08/21/2020 PCP: Janith Lima, MD  Brief History:  y 59 .o. male with medical history significant of with past medical history of GERD, OSA, history of syphilis, hypertension, depression, DDD, status post recent fall from a ladder approximately 4 weeks ago, falling backwards hitting around 4 to 5 feet on his back presenting today with pain discomfort and yellowish of skin. Describes the fall as accidental, did not hit his head or loss consciousness.-Now experiencing left upper back, rib pain no radiation worse with movement and deep breathing. Admit that he has been taken ibuprofen and Tylenol without relief. Per patient and family he is more yellowish in skin, which has gotten worse and this prompted patient to seek medical care. Patient denies fevers, chills, headache, chest pain, dyspnea, nausea, vomiting, diarrhea, abdominal pain, dysuria, hematuria, hematochezia, and melena.  ED Course:   Vitals: Medically stable, Abnormal labs; elevated LFTs AST 720, ALT 724, alk phos 267, total bilirubin 12.5, lipase 60, Tylenol level < 10 Chest x-ray multiple rib fractures negative for pneumothorax, EKG normal sinus rhythm, CT abdomen pelvis chest reviewed consistent with rib fractures, subacute lower lumbar fractures, CT did not reveal any obstruction-but showed inflammation of the duodenum and liver nodules identified, subacute left-sided rib fractures and lumbar spine transverse process fractures   Assessment/Plan: Hyperbilirubinemia-jaundice -due to hepatitis B infection -Bili appears to reached plateau -Continue to monitor closely, currently not complaining of confusion headaches abdominal pain or asterixis -Monitoring LFTs-overall reaching ALT plateau --CT abdomen pelvis-reviewed: Mildly lobular hepatic surface, suggestive of cirrhosis. Splenomegaly, could be an early manifestation of portal hypertension in the  setting of cirrhosis.Minimal stranding adjacent the first and second portion of the duodenum withOUT significant wall thickening, extraluminal gas or free fluid -abdominal ultrasound-does not reveal any signs of obstruction at this time, therefore no need for MRCP, -Gastroenterology consult, appreciate their input -Total bilirubin 12.5>>14.6 >>> 17.9 >>>18.5 >>18.7 -Direct bilirubin 7.4, indirect 5.2  -Continue IV fluid hydration  Acute hepatitis B infection  Hepatitis B core IgM positive, pending IgG -Hep B sAg positive -Continues to have elevated LFTs: Alk phos 267 >> 225 >> 231 >> 218 AST 720 >> 738 >> 911 >>905 ALT 724 >> 687 >> 766 >>746  Lipase 60 -INR 1.0, 1.1, -avoid hepatotoxic agents -GI following recommending outpatient follow-up with hepatologist -check hep B e antigen and antibody -check hep B DNA -check hep B surface antibody -check hep A antibody -check  Hep Delta  Closed rib fracture -incentive spirometer, needed analgesics -Continue to complain of pain but improved with pain medication -Status post recent fall, sustaining rib fractures -CT scan reviewed-revealingleft posterior4th - 8th rib fractures.  Lumbar fracture CT abdomen pelvis revealing-Subacute appearing left L1-L3 transversefractures As needed analgesics, PT OT evaluation -Remained stable  Active Problems:  GERD(gastroesophageal reflux disease) -not on PPI -pt prefers observation  OSA(obstructive sleep apnea)  -outpatient referral for sleep study, does not supply oxygen with CPAP at home  Essential hypertension, benign -BP remained stable - not on any home medication at this time  Class 1 obesitydue to excess calories with serious comorbidity and body mass index (BMI) of 33.0 to 33.9 in adult -consulted regarding weight loss  H/oSecondary Late Latent syphilis -apparently posttreatment  depression -resuming home medication of  Effexor      Status is: Inpatient    Dispo: The patient is from: Home  Anticipated d/c is to: Home              Patient currently is not medically stable to d/c.   Difficult to place patient No        Family Communication: no  Family at bedside  Consultants:  GI  Code Status:  FULL  DVT Prophylaxis:  [place] Heparin    Procedures: As Listed in Progress Note Above  Antibiotics: None       Subjective: Patient denies fevers, chills, headache, chest pain, dyspnea, nausea, vomiting, diarrhea, abdominal pain, dysuria, hematuria, hematochezia, and melena.   Objective: Vitals:   08/24/20 1339 08/24/20 1521 08/24/20 2040 08/25/20 0407  BP:  113/76 123/75 129/73  Pulse:  71 89 77  Resp:  $Remo'20 20 20  'BQcMa$ Temp:  98.3 F (36.8 C) 98 F (36.7 C) 98 F (36.7 C)  TempSrc:  Oral    SpO2: 94% 100% 96% 99%  Weight:      Height:        Intake/Output Summary (Last 24 hours) at 08/25/2020 1507 Last data filed at 08/25/2020 0517 Gross per 24 hour  Intake 3605.47 ml  Output -  Net 3605.47 ml   Weight change:  Exam:   General:  Pt is alert, follows commands appropriately, not in acute distress  HEENT: No icterus, No thrush, No neck mass, [place]/AT  Cardiovascular: RRR, S1/S2, no rubs, no gallops  Respiratory: CTA bilaterally, no wheezing, no crackles, no rhonchi  Abdomen: Soft/+BS, non tender, non distended, no guarding  Extremities: No edema, No lymphangitis, No petechiae, No rashes, no synovitis   Data Reviewed: I have personally reviewed following labs and imaging studies Basic Metabolic Panel: Recent Labs  Lab 08/21/20 0317 08/22/20 0515 08/23/20 0636 08/24/20 0613 08/25/20 0606  NA 133* 132* 134* 137 136  K 3.9 4.1 4.1 3.7 3.9  CL 100 102 100 102 105  CO2 $Re'25 25 27 27 23  'Kgo$ GLUCOSE 111* 98 104* 104* 85  BUN $Re'9 8 7 8 8  'JMW$ CREATININE 0.72 0.51* 0.53* 0.55* 0.45*  CALCIUM 8.8* 8.5* 8.9 8.7* 8.7*  MG 1.9  --   --   --   --   PHOS 3.6  --   --   --    --    Liver Function Tests: Recent Labs  Lab 08/21/20 1036 08/22/20 0515 08/23/20 0636 08/24/20 0613 08/25/20 0606  AST 704* 738* 911* 905* 900*  ALT 703* 687* 766* 746* 731*  ALKPHOS 251* 225* 231* 218* 200*  BILITOT 12.6* 14.6* 17.9* 18.5* 18.7*  PROT 6.6 6.1* 6.2* 6.0* 5.9*  ALBUMIN 2.8* 2.7* 2.7* 2.6* 2.5*   Recent Labs  Lab 08/21/20 0317  LIPASE 60*   No results for input(s): AMMONIA in the last 168 hours. Coagulation Profile: Recent Labs  Lab 08/21/20 0317 08/22/20 0515 08/23/20 1237 08/24/20 0807  INR 1.1 1.2 1.0 1.1   CBC: Recent Labs  Lab 08/21/20 0317 08/22/20 0515 08/23/20 0636 08/24/20 0613 08/25/20 0606  WBC 6.1 6.0 6.5 5.6 7.2  NEUTROABS 3.8  --   --   --   --   HGB 11.9* 11.3* 11.2* 11.1* 10.3*  HCT 31.0* 31.6* 28.1* 30.1* RESULTS UNAVAILABLE DUE TO INTERFERING SUBSTANCE  MCV 109.9* 108.6* 110.6* 109.1* RESULTS UNAVAILABLE DUE TO INTERFERING SUBSTANCE  PLT 242 194 247 227 214   Cardiac Enzymes: No results for input(s): CKTOTAL, CKMB, CKMBINDEX, TROPONINI in the last 168 hours. BNP: Invalid input(s): POCBNP CBG: Recent Labs  Lab 08/23/20 0733 08/23/20 1111  08/24/20 0748 08/25/20 0714 08/25/20 1119  GLUCAP 90 96 101* 73 105*   HbA1C: No results for input(s): HGBA1C in the last 72 hours. Urine analysis:    Component Value Date/Time   COLORURINE AMBER (A) 08/21/2020 1427   APPEARANCEUR CLEAR 08/21/2020 1427   LABSPEC 1.045 (H) 08/21/2020 1427   PHURINE 6.0 08/21/2020 1427   GLUCOSEU NEGATIVE 08/21/2020 1427   GLUCOSEU NEGATIVE 05/19/2020 1106   HGBUR NEGATIVE 08/21/2020 1427   BILIRUBINUR MODERATE (A) 08/21/2020 1427   KETONESUR NEGATIVE 08/21/2020 1427   PROTEINUR NEGATIVE 08/21/2020 1427   UROBILINOGEN >=8.0 (A) 05/19/2020 1106   NITRITE NEGATIVE 08/21/2020 1427   LEUKOCYTESUR NEGATIVE 08/21/2020 1427   Sepsis Labs: $RemoveBefo'@[id number]$ (procalcitonin:4,lacticidven:4) ) Recent Results (from the past 240 hour(s))  Resp Panel by  RT-PCR (Flu A&B, Covid) Nasopharyngeal Swab     Status: None   Collection Time: 08/21/20  4:26 AM   Specimen: Nasopharyngeal Swab; Nasopharyngeal(NP) swabs in vial transport medium  Result Value Ref Range Status   SARS Coronavirus 2 by RT PCR NEGATIVE NEGATIVE Final    Comment: (NOTE) SARS-CoV-2 target nucleic acids are NOT DETECTED.  The SARS-CoV-2 RNA is generally detectable in upper respiratory specimens during the acute phase of infection. The lowest concentration of SARS-CoV-2 viral copies this assay can detect is 138 copies/mL. A negative result does not preclude SARS-Cov-2 infection and should not be used as the sole basis for treatment or other patient management decisions. A negative result may occur with  improper specimen collection/handling, submission of specimen other than nasopharyngeal swab, presence of viral mutation(s) within the areas targeted by this assay, and inadequate number of viral copies(<138 copies/mL). A negative result must be combined with clinical observations, patient history, and epidemiological information. The expected result is Negative.  Fact Sheet for Patients:  EntrepreneurPulse.com.au  Fact Sheet for Healthcare Providers:  IncredibleEmployment.be  This test is no t yet approved or cleared by the [place] FDA and  has been authorized for detection and/or diagnosis of SARS-CoV-2 by FDA under an Emergency Use Authorization (EUA). This EUA will remain  in effect (meaning this test can be used) for the duration of the COVID-19 declaration under Section 564(b)(1) of the Act, 21 U.S.C.section 360bbb-3(b)(1), unless the authorization is terminated  or revoked sooner.       Influenza A by PCR NEGATIVE NEGATIVE Final   Influenza B by PCR NEGATIVE NEGATIVE Final    Comment: (NOTE) The Xpert Xpress SARS-CoV-2/FLU/RSV plus assay is intended as an aid in the diagnosis of influenza from Nasopharyngeal swab  specimens and should not be used as a sole basis for treatment. Nasal washings and aspirates are unacceptable for Xpert Xpress SARS-CoV-2/FLU/RSV testing.  Fact Sheet for Patients: EntrepreneurPulse.com.au  Fact Sheet for Healthcare Providers: IncredibleEmployment.be  This test is not yet approved or cleared by the [place] FDA and has been authorized for detection and/or diagnosis of SARS-CoV-2 by FDA under an Emergency Use Authorization (EUA). This EUA will remain in effect (meaning this test can be used) for the duration of the COVID-19 declaration under Section 564(b)(1) of the Act, 21 U.S.C. section 360bbb-3(b)(1), unless the authorization is terminated or revoked.  Performed at [facility], [address] [place], [place] [address]      Scheduled Meds: . heparin  5,000 Units Subcutaneous Q8H  . multivitamin with minerals  1 tablet Oral Daily  . senna  1 tablet Oral BID  . sodium chloride flush  3 mL Intravenous Q12H  . sodium chloride flush  3 mL Intravenous Q12H  . venlafaxine XR  150 mg Oral Q breakfast   Continuous Infusions: . sodium chloride    . sodium chloride 150 mL/hr at 08/25/20 1048  . methocarbamol (ROBAXIN) IV      Procedures/Studies: DG Ribs Unilateral W/Chest Left  Result Date: 08/21/2020 CLINICAL DATA:  55 year old male with fall and left chest wall pain. EXAM: LEFT RIBS AND CHEST - 3+ VIEW COMPARISON:  None. FINDINGS: There multiple mildly displaced left posterior rib fractures involving the fourth-eighth ribs. No focal consolidation, pleural effusion, pneumothorax. The cardiac silhouette is within limits. IMPRESSION: Multiple mildly displaced left posterior rib fractures. No pneumothorax. Electronically Signed   By: Anner Crete M.D.   On: 08/21/2020 02:29   DG Thoracic Spine 2 View  Result Date: 08/21/2020 CLINICAL DATA:  55 year old male with fall. EXAM: THORACIC SPINE 2 VIEWS COMPARISON:  Chest  radiograph dated 08/21/2020. FINDINGS: There is no acute fracture or subluxation of the thoracic spine. Multilevel degenerative changes and mild chronic compression fractures. Partially visualized multiple left posterior rib fractures. The soft tissues are unremarkable. IMPRESSION: 1. No acute fracture or subluxation of the thoracic spine. 2. Left posterior rib fractures. Electronically Signed   By: Anner Crete M.D.   On: 08/21/2020 02:31   CT Chest W Contrast  Result Date: 08/21/2020 CLINICAL DATA:  Fall from ladder 3 weeks ago, persistent back pain. EXAM: CT CHEST, ABDOMEN, AND PELVIS WITH CONTRAST TECHNIQUE: Multidetector CT imaging of the chest, abdomen and pelvis was performed following the standard protocol during bolus administration of intravenous contrast. CONTRAST:  151mL OMNIPAQUE IOHEXOL 300 MG/ML  SOLN COMPARISON:  Radiographs 08/21/2020 FINDINGS: CT CHEST FINDINGS Cardiovascular: The aortic root is suboptimally assessed given cardiac pulsation artifact. The aorta is normal caliber. No acute luminal abnormality of the imaged aorta. No periaortic stranding or hemorrhage. Normal 3 vessel branching of the aortic arch. Proximal great vessels are unremarkable. Normal heart size. No pericardial effusion. Central pulmonary arteries are normal caliber no large central filling defects with more distal evaluation limited by non tailored examination. No major venous abnormalities are seen. Mediastinum/Nodes: No mediastinal fluid or gas. Normal thyroid gland and thoracic inlet. No acute abnormality of the trachea or esophagus. No worrisome mediastinal, hilar or axillary adenopathy. Lungs/Pleura: No acute traumatic abnormality of the lung parenchyma. Thickening adjacent the contiguous posterior left-sided rib fractures appears to be largely subpleural without layering effusion or pneumothorax. Some minimal adjacent ground-glass could be atelectatic or reflective atelectasis versus resolving pulmonary  contusive change. Lungs are otherwise clear. No consolidative process or edema. Musculoskeletal: Subacute appearing left posterior fourth through eighth rib fractures. Adjacent thickening appears largely subpleural, as above. No right-sided rib fractures. No other acute traumatic osseous injuries of the chest wall or imaged thoracic spine. Mild dextrocurvature of the mid to upper thoracic levels. CT ABDOMEN PELVIS FINDINGS Hepatobiliary: No direct hepatic injury or perihepatic hematoma. Solitary subcentimeter hypoattenuating focus in the right lobe liver (8/16), statistically likely benign. No worrisome focal liver lesions. Mildly lobular hepatic surface contour. Normal hepatic attenuation. Normal gallbladder and biliary tree. No significant biliary ductal dilatation or visible gallstones. Pancreas: No pancreatic ductal dilatation or surrounding inflammatory changes. Spleen: Splenomegaly. No focal splenic lesions. No direct splenic injury or perisplenic hematoma. Adrenals/Urinary Tract: Normal adrenal glands. Kidneys are normally located with symmetric enhancementand excretion. Few subcentimeter hypoattenuating foci are too small to fully characterize. No suspicious renal lesion, urolithiasis or hydronephrosis. Traumatic or acute bladder abnormality is seen. Stomach/Bowel: Distal esophagus, stomach are unremarkable. Minimal stranding  adjacent the first and second portion of the duodenum (2/65) without significant wall thickening, extraluminal gas or free fluid. No small bowel wall thickening or dilatation. No evidence of obstruction. A normal appendix is visualized. No colonic dilatation or wall thickening. Vascular/Lymphatic: No significant vascular findings are present. No enlarged abdominal or pelvic lymph nodes. Reproductive: The prostate and seminal vesicles are unremarkable. No acute abnormality of the external genitalia. Other: No abdominopelvic free fluid or free gas. No bowel containing hernias.  Musculoskeletal: No acute osseous abnormality or suspicious osseous lesion. Subacute appearing left L1-L3 transverse process fractures. 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 Levocurvature of the lumbar spine, apex L4-5. Multilevel degenerative changes are present in the imaged portions of the spine. Bones of the pelvis are intact and congruent. Proximal femora are intact and normally located. IMPRESSION: 1. Subacute appearing left posterior fourth through eighth rib fractures. Adjacent thickening appears to be largely subpleural without layering effusion or pneumothorax. Some minimal adjacent ground-glass could be atelectatic related to splinting versus resolving pulmonary contusive change. 2. Subacute appearing left L1-L3 transverse process fractures. 3. Minimal stranding adjacent the first and second portion of the duodenum without significant wall thickening, extraluminal gas or free fluid. Findings could reflect a mild duodenitis or peptic ulcer disease in the appropriate clinical setting 4. Mildly lobular hepatic surface, suggestive of cirrhosis. Splenomegaly, could be an early manifestation of portal hypertension in the setting of cirrhosis. 5. 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 Anatomic variant. These results were called by telephone at the time of interpretation on 08/21/2020 at 6:10 am to provider [facility] , who verbally acknowledged these results. Electronically Signed   By: Kreg Shropshire M.D.   On: 08/21/2020 06:10   CT ABDOMEN PELVIS W CONTRAST  Result Date: 08/21/2020 CLINICAL DATA:  Fall from ladder 3 weeks ago, persistent back pain. EXAM: CT CHEST, ABDOMEN, AND PELVIS WITH CONTRAST TECHNIQUE: Multidetector CT imaging of the chest, abdomen and pelvis was performed following the standard protocol during bolus administration of intravenous contrast. CONTRAST:   OMNIPAQUE IOHEXOL 300 MG/ML  SOLN COMPARISON:  Radiographs 08/21/2020 FINDINGS: CT CHEST FINDINGS Cardiovascular: The aortic root is suboptimally assessed given cardiac pulsation artifact. The aorta is normal caliber. No acute luminal abnormality of the imaged aorta. No periaortic stranding or hemorrhage. Normal 3 vessel branching of the aortic arch. Proximal great vessels are unremarkable. Normal heart size. No pericardial effusion. Central pulmonary arteries are normal caliber no large central filling defects with more distal evaluation limited by non tailored examination. No major venous abnormalities are seen. Mediastinum/Nodes: No mediastinal fluid or gas. Normal thyroid gland and thoracic inlet. No acute abnormality of the trachea or esophagus. No worrisome mediastinal, hilar or axillary adenopathy. Lungs/Pleura: No acute traumatic abnormality of the lung parenchyma. Thickening adjacent the contiguous posterior left-sided rib fractures appears to be largely subpleural without layering effusion or pneumothorax. Some minimal adjacent ground-glass could be atelectatic or reflective atelectasis versus resolving pulmonary contusive change. Lungs are otherwise clear. No consolidative process or edema. Musculoskeletal: Subacute appearing left posterior fourth through eighth rib fractures. Adjacent thickening appears largely subpleural, as above. No right-sided rib fractures. No other acute traumatic osseous injuries of the chest wall or imaged thoracic spine. Mild dextrocurvature of the mid to upper thoracic levels. CT ABDOMEN PELVIS FINDINGS Hepatobiliary: No direct hepatic injury or perihepatic hematoma. Solitary subcentimeter hypoattenuating focus in the right lobe liver (8/16), statistically likely benign.  No worrisome focal liver lesions. Mildly lobular hepatic surface contour. Normal hepatic attenuation. Normal gallbladder and biliary tree. No significant biliary ductal dilatation or visible gallstones.  Pancreas: No pancreatic ductal dilatation or surrounding inflammatory changes. Spleen: Splenomegaly. No focal splenic lesions. No direct splenic injury or perisplenic hematoma. Adrenals/Urinary Tract: Normal adrenal glands. Kidneys are normally located with symmetric enhancementand excretion. Few subcentimeter hypoattenuating foci are too small to fully characterize. No suspicious renal lesion, urolithiasis or hydronephrosis. Traumatic or acute bladder abnormality is seen. Stomach/Bowel: Distal esophagus, stomach are unremarkable. Minimal stranding adjacent the first and second portion of the duodenum (2/65) without significant wall thickening, extraluminal gas or free fluid. No small bowel wall thickening or dilatation. No evidence of obstruction. A normal appendix is visualized. No colonic dilatation or wall thickening. Vascular/Lymphatic: No significant vascular findings are present. No enlarged abdominal or pelvic lymph nodes. Reproductive: The prostate and seminal vesicles are unremarkable. No acute abnormality of the external genitalia. Other: No abdominopelvic free fluid or free gas. No bowel containing hernias. Musculoskeletal: No acute osseous abnormality or suspicious osseous lesion. Subacute appearing left L1-L3 transverse process fractures. 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 Levocurvature of the lumbar spine, apex L4-5. Multilevel degenerative changes are present in the imaged portions of the spine. Bones of the pelvis are intact and congruent. Proximal femora are intact and normally located. IMPRESSION: 1. Subacute appearing left posterior fourth through eighth rib fractures. Adjacent thickening appears to be largely subpleural without layering effusion or pneumothorax. Some minimal adjacent ground-glass could be atelectatic related to splinting versus resolving pulmonary contusive change. 2. Subacute appearing left L1-L3 transverse  process fractures. 3. Minimal stranding adjacent the first and second portion of the duodenum without significant wall thickening, extraluminal gas or free fluid. Findings could reflect a mild duodenitis or peptic ulcer disease in the appropriate clinical setting 4. Mildly lobular hepatic surface, suggestive of cirrhosis. Splenomegaly, could be an early manifestation of portal hypertension in the setting of cirrhosis. 5. 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 Anatomic variant. These results were called by telephone at the time of interpretation on 08/21/2020 at 6:10 am to provider [facility] , who verbally acknowledged these results. Electronically Signed   By: Lovena Le M.D.   On: 08/21/2020 06:10   US Abdomen Limited RUQ (LIVER/GB)  Result Date: 08/21/2020 CLINICAL DATA:  Pain of the abdomen in status post fall 3 weeks ago. EXAM: ULTRASOUND ABDOMEN LIMITED RIGHT UPPER QUADRANT COMPARISON:  CT abdomen pelvis August 21, 2020 FINDINGS: Gallbladder: No gallstones or wall thickening visualized. No sonographic Murphy sign noted by sonographer. Common bile duct: Diameter: 4 mm Liver: There is a 0.9 x 0.8 x 1 cm simple cyst in the right lobe liver correlating to CT finding. Within normal limits in parenchymal echogenicity. Portal vein is patent on color Doppler imaging with normal direction of blood flow towards the liver. Other: None. IMPRESSION: No sonographic evidence of acute cholecystitis. Simple cyst right lobe liver. Electronically Signed   By: Abelardo Diesel M.D.   On: 08/21/2020 09:22    Orson Eva, DO  [facility]  If 7PM-7AM, please contact night-coverage [email or web address] Password [email or web address] 08/25/2020, 3:07 PM   LOS: 4 days

## 2020-08-26 ENCOUNTER — Telehealth: Payer: Self-pay | Admitting: Gastroenterology

## 2020-08-26 ENCOUNTER — Other Ambulatory Visit: Payer: Self-pay

## 2020-08-26 DIAGNOSIS — R17 Unspecified jaundice: Secondary | ICD-10-CM

## 2020-08-26 DIAGNOSIS — B169 Acute hepatitis B without delta-agent and without hepatic coma: Principal | ICD-10-CM

## 2020-08-26 DIAGNOSIS — Z79899 Other long term (current) drug therapy: Secondary | ICD-10-CM

## 2020-08-26 LAB — COMPREHENSIVE METABOLIC PANEL
ALT: 809 U/L — ABNORMAL HIGH (ref 0–44)
AST: 1094 U/L — ABNORMAL HIGH (ref 15–41)
Albumin: 2.4 g/dL — ABNORMAL LOW (ref 3.5–5.0)
Alkaline Phosphatase: 198 U/L — ABNORMAL HIGH (ref 38–126)
Anion gap: 9 (ref 5–15)
BUN: 7 mg/dL (ref 6–20)
CO2: 25 mmol/L (ref 22–32)
Calcium: 8.7 mg/dL — ABNORMAL LOW (ref 8.9–10.3)
Chloride: 102 mmol/L (ref 98–111)
Creatinine, Ser: 0.5 mg/dL — ABNORMAL LOW (ref 0.61–1.24)
GFR, Estimated: >60 mL/min (ref >60)
Glucose, Bld: 83 mg/dL (ref 70–99)
Potassium: 4.1 mmol/L (ref 3.5–5.1)
Sodium: 136 mmol/L (ref 135–145)
Total Bilirubin: 20.6 mg/dL (ref 0.3–1.2)
Total Protein: 5.9 g/dL — ABNORMAL LOW (ref 6.5–8.1)

## 2020-08-26 LAB — CBC
Hemoglobin: 10.5 g/dL — ABNORMAL LOW (ref 13.0–17.0)
Platelets: 215 10*3/uL (ref 150–400)
WBC: 7 10*3/uL (ref 4.0–10.5)

## 2020-08-26 LAB — HEPATITIS A ANTIBODY, IGM: Hep A IgM: NONREACTIVE

## 2020-08-26 LAB — HCV RNA QUANT: HCV Quantitative: NOT DETECTED IU/mL (ref >50)

## 2020-08-26 LAB — GLUCOSE, CAPILLARY: Glucose-Capillary: 71 mg/dL (ref 70–99)

## 2020-08-26 LAB — HEPATITIS B E ANTIBODY: Hep B E Ab: NEGATIVE

## 2020-08-26 LAB — PROTIME-INR
INR: 1.1 (ref 0.8–1.2)
Prothrombin Time: 13.6 seconds (ref 11.4–15.2)

## 2020-08-26 LAB — HEPATITIS B DNA, ULTRAQUANTITATIVE, PCR
HBV DNA SERPL PCR-ACNC: 5290000 IU/mL
HBV DNA SERPL PCR-LOG IU: 6.723 log10 IU/mL

## 2020-08-26 LAB — HEPATITIS A ANTIBODY, TOTAL: hep A Total Ab: NONREACTIVE

## 2020-08-26 LAB — HEPATITIS B E ANTIGEN: Hep B E Ag: POSITIVE — AB

## 2020-08-26 MED ORDER — OXYCODONE HCL 5 MG PO TABS: 5.0000 mg | ORAL_TABLET | ORAL | 0 refills | Status: DC | PRN

## 2020-08-26 NOTE — Telephone Encounter (Signed)
Please arrange for hospital follow up in 2-3 weeks.   Please arrange for LFTs on Monday 08/30/20.

## 2020-08-26 NOTE — Progress Notes (Signed)
Date and time results received: 08/26/20 @ 0750  Test: Bilirubin Critical Value: 20.6  Name of Provider Notified: Tat  Orders Received? Or Actions Taken?  No new orders placed.

## 2020-08-26 NOTE — Telephone Encounter (Signed)
Spoke with pts spouse. Lab orders will be placed and pt will complete labs at [facility].

## 2020-08-26 NOTE — Progress Notes (Signed)
Subjective:  Patient denies N/V, abdominal pain. He wants to go home. Spoke to his wife on the phone per patient request. Discussed need for her and other household contacts to be tested for Hepatitis B. She will need to see her PCP or the health department. She reports no sexual intercourse with patient in months, therefore she would not likely qualify for Hepatitis B immune globulin.   Objective: Vital signs in last 24 hours: Temp:  [97.9 F (36.6 C)-98.3 F (36.8 C)] 98.3 F (36.8 C) (03/24 0603) Pulse Rate:  [70-79] 79 (03/24 0603) Resp:  [16-18] 16 (03/24 0603) BP: (123-132)/(78-81) 132/81 (03/24 0603) SpO2:  [97 %-99 %] 98 % (03/24 0603) Weight:  [103.2 kg] 103.2 kg (03/24 0500) Last BM Date: 08/25/20 General:   Alert,  Well-developed, well-nourished, pleasant and cooperative in NAD. Markedly jaundiced. Head:  Normocephalic and atraumatic. Eyes:  Sclera clear, + icterus.  Abdomen:  Soft, nontender and nondistended. No hepatosplenomegaly or hernias noted. Normal bowel sounds, without guarding, and without rebound.   Extremities:  Without clubbing, deformity or edema. Neurologic:  Alert and  oriented x4;  grossly normal neurologically. Psych:  Alert and cooperative. Normal mood and affect.  Intake/Output from previous day: 03/23 0701 - 03/24 0700 In: 240 [P.O.:240] Out: -  Intake/Output this shift: Total I/O In: 3 [I.V.:3] Out: -   Lab Results: CBC Recent Labs    08/24/20 0613 08/25/20 0606 08/26/20 0520  WBC 5.6 7.2 7.0  HGB 11.1* 10.3* 10.5*  HCT 30.1* RESULTS UNAVAILABLE DUE TO INTERFERING SUBSTANCE RESULTS UNAVAILABLE DUE TO INTERFERING SUBSTANCE  MCV 109.1* RESULTS UNAVAILABLE DUE TO INTERFERING SUBSTANCE RESULTS UNAVAILABLE DUE TO INTERFERING SUBSTANCE  PLT 227 214 215   BMET Recent Labs    08/24/20 0613 08/25/20 0606 08/26/20 0520  NA 137 136 136  K 3.7 3.9 4.1  CL 102 105 102  CO2 27 23 25   GLUCOSE 104* 85 83  BUN 8 8 7   CREATININE 0.55* 0.45*  0.50*  CALCIUM 8.7* 8.7* 8.7*   LFTs Recent Labs    08/24/20 0613 08/25/20 0606 08/26/20 0520  BILITOT 18.5* 18.7* 20.6*  ALKPHOS 218* 200* 198*  AST 905* 900* 1,094*  ALT 746* 731* 809*  PROT 6.0* 5.9* 5.9*  ALBUMIN 2.6* 2.5* 2.4*   No results for input(s): LIPASE in the last 72 hours. PT/INR Recent Labs    08/23/20 1237 08/24/20 0807 08/26/20 0520  LABPROT 13.1 13.7 13.6  INR 1.0 1.1 1.1      Imaging Studies: DG Ribs Unilateral W/Chest Left  Result Date: 08/21/2020 CLINICAL DATA:  55 year old male with fall and left chest wall pain. EXAM: LEFT RIBS AND CHEST - 3+ VIEW COMPARISON:  None. FINDINGS: There multiple mildly displaced left posterior rib fractures involving the fourth-eighth ribs. No focal consolidation, pleural effusion, pneumothorax. The cardiac silhouette is within limits. IMPRESSION: Multiple mildly displaced left posterior rib fractures. No pneumothorax. Electronically Signed   By: 08/23/2020 M.D.   On: 08/21/2020 02:29   DG Thoracic Spine 2 View  Result Date: 08/21/2020 CLINICAL DATA:  55 year old male with fall. EXAM: THORACIC SPINE 2 VIEWS COMPARISON:  Chest radiograph dated 08/21/2020. FINDINGS: There is no acute fracture or subluxation of the thoracic spine. Multilevel degenerative changes and mild chronic compression fractures. Partially visualized multiple left posterior rib fractures. The soft tissues are unremarkable. IMPRESSION: 1. No acute fracture or subluxation of the thoracic spine. 2. Left posterior rib fractures. Electronically Signed   By: 40.D.  On: 08/21/2020 02:31   CT Chest W Contrast  Result Date: 08/21/2020 CLINICAL DATA:  Fall from ladder 3 weeks ago, persistent back pain. EXAM: CT CHEST, ABDOMEN, AND PELVIS WITH CONTRAST TECHNIQUE: Multidetector CT imaging of the chest, abdomen and pelvis was performed following the standard protocol during bolus administration of intravenous contrast. CONTRAST:  OMNIPAQUE  IOHEXOL 300 MG/ML  SOLN COMPARISON:  Radiographs 08/21/2020 FINDINGS: CT CHEST FINDINGS Cardiovascular: The aortic root is suboptimally assessed given cardiac pulsation artifact. The aorta is normal caliber. No acute luminal abnormality of the imaged aorta. No periaortic stranding or hemorrhage. Normal 3 vessel branching of the aortic arch. Proximal great vessels are unremarkable. Normal heart size. No pericardial effusion. Central pulmonary arteries are normal caliber no large central filling defects with more distal evaluation limited by non tailored examination. No major venous abnormalities are seen. Mediastinum/Nodes: No mediastinal fluid or gas. Normal thyroid gland and thoracic inlet. No acute abnormality of the trachea or esophagus. No worrisome mediastinal, hilar or axillary adenopathy. Lungs/Pleura: No acute traumatic abnormality of the lung parenchyma. Thickening adjacent the contiguous posterior left-sided rib fractures appears to be largely subpleural without layering effusion or pneumothorax. Some minimal adjacent ground-glass could be atelectatic or reflective atelectasis versus resolving pulmonary contusive change. Lungs are otherwise clear. No consolidative process or edema. Musculoskeletal: Subacute appearing left posterior fourth through eighth rib fractures. Adjacent thickening appears largely subpleural, as above. No right-sided rib fractures. No other acute traumatic osseous injuries of the chest wall or imaged thoracic spine. Mild dextrocurvature of the mid to upper thoracic levels. CT ABDOMEN PELVIS FINDINGS Hepatobiliary: No direct hepatic injury or perihepatic hematoma. Solitary subcentimeter hypoattenuating focus in the right lobe liver (8/16), statistically likely benign. No worrisome focal liver lesions. Mildly lobular hepatic surface contour. Normal hepatic attenuation. Normal gallbladder and biliary tree. No significant biliary ductal dilatation or visible gallstones. Pancreas: No  pancreatic ductal dilatation or surrounding inflammatory changes. Spleen: Splenomegaly. No focal splenic lesions. No direct splenic injury or perisplenic hematoma. Adrenals/Urinary Tract: Normal adrenal glands. Kidneys are normally located with symmetric enhancementand excretion. Few subcentimeter hypoattenuating foci are too small to fully characterize. No suspicious renal lesion, urolithiasis or hydronephrosis. Traumatic or acute bladder abnormality is seen. Stomach/Bowel: Distal esophagus, stomach are unremarkable. Minimal stranding adjacent the first and second portion of the duodenum (2/65) without significant wall thickening, extraluminal gas or free fluid. No small bowel wall thickening or dilatation. No evidence of obstruction. A normal appendix is visualized. No colonic dilatation or wall thickening. Vascular/Lymphatic: No significant vascular findings are present. No enlarged abdominal or pelvic lymph nodes. Reproductive: The prostate and seminal vesicles are unremarkable. No acute abnormality of the external genitalia. Other: No abdominopelvic free fluid or free gas. No bowel containing hernias. Musculoskeletal: No acute osseous abnormality or suspicious osseous lesion. Subacute appearing left L1-L3 transverse process fractures. 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 Levocurvature of the lumbar spine, apex L4-5. Multilevel degenerative changes are present in the imaged portions of the spine. Bones of the pelvis are intact and congruent. Proximal femora are intact and normally located. IMPRESSION: 1. Subacute appearing left posterior fourth through eighth rib fractures. Adjacent thickening appears to be largely subpleural without layering effusion or pneumothorax. Some minimal adjacent ground-glass could be atelectatic related to splinting versus resolving pulmonary contusive change. 2. Subacute appearing left L1-L3 transverse process  fractures. 3. Minimal stranding adjacent the first and second portion of the duodenum without significant wall thickening, extraluminal gas or  free fluid. Findings could reflect a mild duodenitis or peptic ulcer disease in the appropriate clinical setting 4. Mildly lobular hepatic surface, suggestive of cirrhosis. Splenomegaly, could be an early manifestation of portal hypertension in the setting of cirrhosis. 5. 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 Anatomic variant. These results were called by telephone at the time of interpretation on 08/21/2020 at 6:10 am to provider [facility] , who verbally acknowledged these results. Electronically Signed   By: Kreg Shropshire M.D.   On: 08/21/2020 06:10   CT ABDOMEN PELVIS W CONTRAST  Result Date: 08/21/2020 CLINICAL DATA:  Fall from ladder 3 weeks ago, persistent back pain. EXAM: CT CHEST, ABDOMEN, AND PELVIS WITH CONTRAST TECHNIQUE: Multidetector CT imaging of the chest, abdomen and pelvis was performed following the standard protocol during bolus administration of intravenous contrast. CONTRAST:  OMNIPAQUE IOHEXOL 300 MG/ML  SOLN COMPARISON:  Radiographs 08/21/2020 FINDINGS: CT CHEST FINDINGS Cardiovascular: The aortic root is suboptimally assessed given cardiac pulsation artifact. The aorta is normal caliber. No acute luminal abnormality of the imaged aorta. No periaortic stranding or hemorrhage. Normal 3 vessel branching of the aortic arch. Proximal great vessels are unremarkable. Normal heart size. No pericardial effusion. Central pulmonary arteries are normal caliber no large central filling defects with more distal evaluation limited by non tailored examination. No major venous abnormalities are seen. Mediastinum/Nodes: No mediastinal fluid or gas. Normal thyroid gland and thoracic inlet. No acute abnormality of the trachea or esophagus. No worrisome mediastinal, hilar or axillary adenopathy.  Lungs/Pleura: No acute traumatic abnormality of the lung parenchyma. Thickening adjacent the contiguous posterior left-sided rib fractures appears to be largely subpleural without layering effusion or pneumothorax. Some minimal adjacent ground-glass could be atelectatic or reflective atelectasis versus resolving pulmonary contusive change. Lungs are otherwise clear. No consolidative process or edema. Musculoskeletal: Subacute appearing left posterior fourth through eighth rib fractures. Adjacent thickening appears largely subpleural, as above. No right-sided rib fractures. No other acute traumatic osseous injuries of the chest wall or imaged thoracic spine. Mild dextrocurvature of the mid to upper thoracic levels. CT ABDOMEN PELVIS FINDINGS Hepatobiliary: No direct hepatic injury or perihepatic hematoma. Solitary subcentimeter hypoattenuating focus in the right lobe liver (8/16), statistically likely benign. No worrisome focal liver lesions. Mildly lobular hepatic surface contour. Normal hepatic attenuation. Normal gallbladder and biliary tree. No significant biliary ductal dilatation or visible gallstones. Pancreas: No pancreatic ductal dilatation or surrounding inflammatory changes. Spleen: Splenomegaly. No focal splenic lesions. No direct splenic injury or perisplenic hematoma. Adrenals/Urinary Tract: Normal adrenal glands. Kidneys are normally located with symmetric enhancementand excretion. Few subcentimeter hypoattenuating foci are too small to fully characterize. No suspicious renal lesion, urolithiasis or hydronephrosis. Traumatic or acute bladder abnormality is seen. Stomach/Bowel: Distal esophagus, stomach are unremarkable. Minimal stranding adjacent the first and second portion of the duodenum (2/65) without significant wall thickening, extraluminal gas or free fluid. No small bowel wall thickening or dilatation. No evidence of obstruction. A normal appendix is visualized. No colonic dilatation or wall  thickening. Vascular/Lymphatic: No significant vascular findings are present. No enlarged abdominal or pelvic lymph nodes. Reproductive: The prostate and seminal vesicles are unremarkable. No acute abnormality of the external genitalia. Other: No abdominopelvic free fluid or free gas. No bowel containing hernias. Musculoskeletal: No acute osseous abnormality or suspicious osseous lesion. Subacute appearing left L1-L3 transverse process fractures. 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 Levocurvature of the lumbar spine, apex  L4-5. Multilevel degenerative changes are present in the imaged portions of the spine. Bones of the pelvis are intact and congruent. Proximal femora are intact and normally located. IMPRESSION: 1. Subacute appearing left posterior fourth through eighth rib fractures. Adjacent thickening appears to be largely subpleural without layering effusion or pneumothorax. Some minimal adjacent ground-glass could be atelectatic related to splinting versus resolving pulmonary contusive change. 2. Subacute appearing left L1-L3 transverse process fractures. 3. Minimal stranding adjacent the first and second portion of the duodenum without significant wall thickening, extraluminal gas or free fluid. Findings could reflect a mild duodenitis or peptic ulcer disease in the appropriate clinical setting 4. Mildly lobular hepatic surface, suggestive of cirrhosis. Splenomegaly, could be an early manifestation of portal hypertension in the setting of cirrhosis. 5. 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 Anatomic variant. These results were called by telephone at the time of interpretation on 08/21/2020 at 6:10 am to provider [facility] , who verbally acknowledged these results. Electronically Signed   By: Kreg Shropshire M.D.   On: 08/21/2020 06:10   US Abdomen Limited RUQ (LIVER/GB)  Result  Date: 08/21/2020 CLINICAL DATA:  Pain of the abdomen in status post fall 3 weeks ago. EXAM: ULTRASOUND ABDOMEN LIMITED RIGHT UPPER QUADRANT COMPARISON:  CT abdomen pelvis August 21, 2020 FINDINGS: Gallbladder: No gallstones or wall thickening visualized. No sonographic Murphy sign noted by sonographer. Common bile duct: Diameter: 4 mm Liver: There is a 0.9 x 0.8 x 1 cm simple cyst in the right lobe liver correlating to CT finding. Within normal limits in parenchymal echogenicity. Portal vein is patent on color Doppler imaging with normal direction of blood flow towards the liver. Other: None. IMPRESSION: No sonographic evidence of acute cholecystitis. Simple cyst right lobe liver. Electronically Signed   By: Sherian Rein M.D.   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  Acute hepatitis B: LFTs on admission (08/21/20) total bilirubin 12.5, alkaline phosphatase 267, AST 720, ALT 724.  It appears that LFTs have started to plateau 3/21-3/23, with max values of total bilirubin of 18.7, alkaline phosphatase 231, AST 9011, ALT 766. Specifically yesterday's values of total bilirubin 18.7, 200, AST 900, ALT 731.  Today total bilirubin 20.6, alkaline phosphatase 198, AST 1094, ALT 809. INR remains normal at 1.1. HCV Ab negative. HIV negative. Treatment for acute hepatitis be largely supportive care.  Fortunately he is not showing any signs of coagulopathy indicating decompensation.  There was question of cirrhosis on prior imaging (initially called slightly nodular border). Images reviewed by Dr. Levon Hedger, liver does not appear nodular, caudate lobe normal in size, spleen size 14.3cm and no biochemical evidence suggestive of cirrhosis. He will need close outpatient follow up, could consider liver elastography at a  later date once acute hepatitis as resolved.  Clinically patient is stable for discharge. Discussed with Dr. Jena Gauss who agrees.    Plan: 1. Patient will need close interval follow up as outpatient. He will need to have serial labs to trend LFTs to document resolution. If patient goes home today, we would check LFTs on Monday. 2. Personally spoke with wife and patient. Wife should be tested for hepatitis B, vaccinate as appropriate based on serologies. Avoid sexual intercourse until fully vaccinated. She should see her PCP or contact the  health department. Any household contacts should also be checked.  3. Avoid hepatotoxic drugs, alcohol. 4. F/u HCV RNA.  5. F/u pending serologies ordered by attending.   Leanna BattlesLeslie S. Dixon BoosLewis, PA-C [facility] [phone] 3/24/202211:13 AM      LOS: 5 days

## 2020-08-26 NOTE — Discharge Summary (Signed)
Physician Discharge Summary  Lewellyn Fultz QIO:[id number] DOB: [birth year] DOA: 08/21/2020  PCP: Janith Lima, MD  Admit date: 08/21/2020 Discharge date: 08/26/2020  Admitted From: Home Disposition:  Home  Recommendations for Outpatient Follow-up:  1. Follow up with PCP in 1-2 weeks 2. Please obtain BMP/CBC/ LFTs in one week   Discharge Condition: Stable CODE STATUS: FULL Diet recommendation: Heart Healthy    Brief/Interim Summary: 58 .o.malewith medical history significant ofwith past medical history of GERD, OSA, history of syphilis, hypertension, depression, DDD, status post recent fall from a ladder approximately 4 weeks ago,falling backwards hitting around 4 to 5 feet on his back presenting today with pain discomfort and yellowish of skin. Describes the fall as accidental, did not hit his head or loss consciousness.-Now experiencing left upper back, rib pain no radiation worse with movement and deep breathing. Admit that he has been taken ibuprofen and Tylenol without relief. Per patient and family he is more yellowish in skin, which has gotten worse and this prompted patient to seek medical care. Patient denies fevers, chills, headache, chest pain, dyspnea, nausea, vomiting, diarrhea, abdominal pain, dysuria, hematuria, hematochezia, and melena.  ED Course:  Vitals:Medically stable, Abnormal labs;elevated LFTs AST 720, ALT 724, alk phos 267, total bilirubin 12.5, lipase 60, Tylenol level <10 Chest x-ray multiple rib fracturesnegative for pneumothorax, EKG normal sinus rhythm, CT abdomen pelvis chest reviewed consistent with rib fractures, subacute lower lumbar fractures, CT did not reveal any obstruction-but showed inflammation of the duodenum and liver nodules identified, subacute left-sided rib fractures and lumbar spine transverse process fractures  Discharge Diagnoses:  Hyperbilirubinemia-jaundice -due to hepatitis B infection -Bili appears to reached  plateau -Continue to monitor closely, currently not complaining of confusion headaches abdominal pain or asterixis -Monitoring LFTs-overall reaching ALT plateau --CT abdomen pelvis-reviewed: Mildly lobular hepatic surface, suggestive of cirrhosis. Splenomegaly, could be an early manifestation of portal hypertension in the setting of cirrhosis.Minimal stranding adjacent the first and second portion of the duodenum withOUT significant wall thickening, extraluminal gas or free fluid -abdominal ultrasound-does not reveal any signs of obstruction at this time, therefore no need for MRCP, -Gastroenterology consult, appreciate their input -Total bilirubin 12.5>>14.6 >>>17.9 >>>18.5>>18.7 -Direct bilirubin 7.4, indirect 5.2  -Continue IV fluid hydration  Acute hepatitis B infection  Hepatitis B core IgM positive, pending IgG -Hep B sAg positive -Continues to haveelevated LFTs: Alk phos 267 >> 225 >> 231>> 218 AST 720 >> 738 >> 911>>905>>1094 ALT 724 >> 687 >> 766>>746>>809 Lipase 60 -INR 1.0, 1.1, -avoid hepatotoxic agents -GI following recommending outpatient follow-up with hepatologist -check hep B e antigen--positive -hep B e antibody--neg -check hep B DNA--pending -check hep B surface antibody--pending -check hep A antibody--non-reactive -check  Hep Delta -08/26/20--case discussed with GI, Dr. Osie Bond to d/c home with outpt follow up in office  Closed rib fracture -incentive spirometer, needed analgesics -Continue to complain of pain but improved with pain medication -Status post recent fall, sustaining rib fractures -CT scan reviewed-revealingleft posterior4th - 8th rib fractures.  Lumbar fracture CT abdomen pelvis revealing-Subacute appearing left L1-L3 transversefractures As needed analgesics, PT OT evaluation -Remained stable  Active Problems:  GERD(gastroesophageal reflux disease) -not on PPI -pt prefers observation  OSA(obstructive  sleep apnea)  -outpatient referral for sleep study, does not supply oxygen with CPAP at home  Essential hypertension, benign -BP remained stable - not on any home medication at this time  Class 1 obesitydue to excess calories with serious comorbidity and body mass index (BMI) of 33.0 to 33.9 in  adult -consulted regarding weight loss  H/oSecondary Late Latent syphilis -apparently posttreatment  depression -resuming home medication of Effexor    Discharge Instructions   Allergies as of 08/26/2020   No Known Allergies     Medication List    STOP taking these medications   Cholecalciferol 1.25 MG (50000 UT) capsule   ibuprofen 200 MG tablet Commonly known as: ADVIL     TAKE these medications   oxyCODONE 5 MG immediate release tablet Commonly known as: Oxy IR/ROXICODONE Take 1 tablet (5 mg total) by mouth every 4 (four) hours as needed for moderate pain.   venlafaxine XR 150 MG 24 hr capsule Commonly known as: EFFEXOR-XR TAKE 1 CAPSULE BY MOUTH DAILY WITH BREAKFAST.       No Known Allergies  Consultations:  GI   Procedures/Studies: DG Ribs Unilateral W/Chest Left  Result Date: 08/21/2020 CLINICAL DATA:  55 year old male with fall and left chest wall pain. EXAM: LEFT RIBS AND CHEST - 3+ VIEW COMPARISON:  None. FINDINGS: There multiple mildly displaced left posterior rib fractures involving the fourth-eighth ribs. No focal consolidation, pleural effusion, pneumothorax. The cardiac silhouette is within limits. IMPRESSION: Multiple mildly displaced left posterior rib fractures. No pneumothorax. Electronically Signed   By: Anner Crete M.D.   On: 08/21/2020 02:29   DG Thoracic Spine 2 View  Result Date: 08/21/2020 CLINICAL DATA:  55 year old male with fall. EXAM: THORACIC SPINE 2 VIEWS COMPARISON:  Chest radiograph dated 08/21/2020. FINDINGS: There is no acute fracture or subluxation of the thoracic spine. Multilevel degenerative changes and mild  chronic compression fractures. Partially visualized multiple left posterior rib fractures. The soft tissues are unremarkable. IMPRESSION: 1. No acute fracture or subluxation of the thoracic spine. 2. Left posterior rib fractures. Electronically Signed   By: Anner Crete M.D.   On: 08/21/2020 02:31   CT Chest W Contrast  Result Date: 08/21/2020 CLINICAL DATA:  Fall from ladder 3 weeks ago, persistent back pain. EXAM: CT CHEST, ABDOMEN, AND PELVIS WITH CONTRAST TECHNIQUE: Multidetector CT imaging of the chest, abdomen and pelvis was performed following the standard protocol during bolus administration of intravenous contrast. CONTRAST:  144m OMNIPAQUE IOHEXOL 300 MG/ML  SOLN COMPARISON:  Radiographs 08/21/2020 FINDINGS: CT CHEST FINDINGS Cardiovascular: The aortic root is suboptimally assessed given cardiac pulsation artifact. The aorta is normal caliber. No acute luminal abnormality of the imaged aorta. No periaortic stranding or hemorrhage. Normal 3 vessel branching of the aortic arch. Proximal great vessels are unremarkable. Normal heart size. No pericardial effusion. Central pulmonary arteries are normal caliber no large central filling defects with more distal evaluation limited by non tailored examination. No major venous abnormalities are seen. Mediastinum/Nodes: No mediastinal fluid or gas. Normal thyroid gland and thoracic inlet. No acute abnormality of the trachea or esophagus. No worrisome mediastinal, hilar or axillary adenopathy. Lungs/Pleura: No acute traumatic abnormality of the lung parenchyma. Thickening adjacent the contiguous posterior left-sided rib fractures appears to be largely subpleural without layering effusion or pneumothorax. Some minimal adjacent ground-glass could be atelectatic or reflective atelectasis versus resolving pulmonary contusive change. Lungs are otherwise clear. No consolidative process or edema. Musculoskeletal: Subacute appearing left posterior fourth through  eighth rib fractures. Adjacent thickening appears largely subpleural, as above. No right-sided rib fractures. No other acute traumatic osseous injuries of the chest wall or imaged thoracic spine. Mild dextrocurvature of the mid to upper thoracic levels. CT ABDOMEN PELVIS FINDINGS Hepatobiliary: No direct hepatic injury or perihepatic hematoma. Solitary subcentimeter hypoattenuating focus in the right lobe liver (  8/16), statistically likely benign. No worrisome focal liver lesions. Mildly lobular hepatic surface contour. Normal hepatic attenuation. Normal gallbladder and biliary tree. No significant biliary ductal dilatation or visible gallstones. Pancreas: No pancreatic ductal dilatation or surrounding inflammatory changes. Spleen: Splenomegaly. No focal splenic lesions. No direct splenic injury or perisplenic hematoma. Adrenals/Urinary Tract: Normal adrenal glands. Kidneys are normally located with symmetric enhancementand excretion. Few subcentimeter hypoattenuating foci are too small to fully characterize. No suspicious renal lesion, urolithiasis or hydronephrosis. Traumatic or acute bladder abnormality is seen. Stomach/Bowel: Distal esophagus, stomach are unremarkable. Minimal stranding adjacent the first and second portion of the duodenum (2/65) without significant wall thickening, extraluminal gas or free fluid. No small bowel wall thickening or dilatation. No evidence of obstruction. A normal appendix is visualized. No colonic dilatation or wall thickening. Vascular/Lymphatic: No significant vascular findings are present. No enlarged abdominal or pelvic lymph nodes. Reproductive: The prostate and seminal vesicles are unremarkable. No acute abnormality of the external genitalia. Other: No abdominopelvic free fluid or free gas. No bowel containing hernias. Musculoskeletal: No acute osseous abnormality or suspicious osseous lesion. Subacute appearing left L1-L3 transverse process fractures. Sacralization of the  left L5 transverse process with pseudoarticulation with the adjacent sacral ala. Levocurvature of the lumbar spine, apex L4-5. Multilevel degenerative changes are present in the imaged portions of the spine. Bones of the pelvis are intact and congruent. Proximal femora are intact and normally located. IMPRESSION: 1. Subacute appearing left posterior fourth through eighth rib fractures. Adjacent thickening appears to be largely subpleural without layering effusion or pneumothorax. Some minimal adjacent ground-glass could be atelectatic related to splinting versus resolving pulmonary contusive change. 2. Subacute appearing left L1-L3 transverse process fractures. 3. Minimal stranding adjacent the first and second portion of the duodenum without significant wall thickening, extraluminal gas or free fluid. Findings could reflect a mild duodenitis or peptic ulcer disease in the appropriate clinical setting 4. Mildly lobular hepatic surface, suggestive of cirrhosis. Splenomegaly, could be an early manifestation of portal hypertension in the setting of cirrhosis. 5. 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 Anatomic variant. These results were called by telephone at the time of interpretation on 08/21/2020 at 6:10 am to provider [facility] , who verbally acknowledged these results. Electronically Signed   By: Lovena Le M.D.   On: 08/21/2020 06:10   CT ABDOMEN PELVIS W CONTRAST  Result Date: 08/21/2020 CLINICAL DATA:  Fall from ladder 3 weeks ago, persistent back pain. EXAM: CT CHEST, ABDOMEN, AND PELVIS WITH CONTRAST TECHNIQUE: Multidetector CT imaging of the chest, abdomen and pelvis was performed following the standard protocol during bolus administration of intravenous contrast. CONTRAST:  123m OMNIPAQUE IOHEXOL 300 MG/ML  SOLN COMPARISON:  Radiographs 08/21/2020 FINDINGS: CT CHEST FINDINGS Cardiovascular: The aortic root is suboptimally assessed  given cardiac pulsation artifact. The aorta is normal caliber. No acute luminal abnormality of the imaged aorta. No periaortic stranding or hemorrhage. Normal 3 vessel branching of the aortic arch. Proximal great vessels are unremarkable. Normal heart size. No pericardial effusion. Central pulmonary arteries are normal caliber no large central filling defects with more distal evaluation limited by non tailored examination. No major venous abnormalities are seen. Mediastinum/Nodes: No mediastinal fluid or gas. Normal thyroid gland and thoracic inlet. No acute abnormality of the trachea or esophagus. No worrisome mediastinal, hilar or axillary adenopathy. Lungs/Pleura: No acute traumatic abnormality of the lung parenchyma. Thickening adjacent the contiguous posterior left-sided rib fractures appears to be largely subpleural without  layering effusion or pneumothorax. Some minimal adjacent ground-glass could be atelectatic or reflective atelectasis versus resolving pulmonary contusive change. Lungs are otherwise clear. No consolidative process or edema. Musculoskeletal: Subacute appearing left posterior fourth through eighth rib fractures. Adjacent thickening appears largely subpleural, as above. No right-sided rib fractures. No other acute traumatic osseous injuries of the chest wall or imaged thoracic spine. Mild dextrocurvature of the mid to upper thoracic levels. CT ABDOMEN PELVIS FINDINGS Hepatobiliary: No direct hepatic injury or perihepatic hematoma. Solitary subcentimeter hypoattenuating focus in the right lobe liver (8/16), statistically likely benign. No worrisome focal liver lesions. Mildly lobular hepatic surface contour. Normal hepatic attenuation. Normal gallbladder and biliary tree. No significant biliary ductal dilatation or visible gallstones. Pancreas: No pancreatic ductal dilatation or surrounding inflammatory changes. Spleen: Splenomegaly. No focal splenic lesions. No direct splenic injury or  perisplenic hematoma. Adrenals/Urinary Tract: Normal adrenal glands. Kidneys are normally located with symmetric enhancementand excretion. Few subcentimeter hypoattenuating foci are too small to fully characterize. No suspicious renal lesion, urolithiasis or hydronephrosis. Traumatic or acute bladder abnormality is seen. Stomach/Bowel: Distal esophagus, stomach are unremarkable. Minimal stranding adjacent the first and second portion of the duodenum (2/65) without significant wall thickening, extraluminal gas or free fluid. No small bowel wall thickening or dilatation. No evidence of obstruction. A normal appendix is visualized. No colonic dilatation or wall thickening. Vascular/Lymphatic: No significant vascular findings are present. No enlarged abdominal or pelvic lymph nodes. Reproductive: The prostate and seminal vesicles are unremarkable. No acute abnormality of the external genitalia. Other: No abdominopelvic free fluid or free gas. No bowel containing hernias. Musculoskeletal: No acute osseous abnormality or suspicious osseous lesion. Subacute appearing left L1-L3 transverse process fractures. 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 Levocurvature of the lumbar spine, apex L4-5. Multilevel degenerative changes are present in the imaged portions of the spine. Bones of the pelvis are intact and congruent. Proximal femora are intact and normally located. IMPRESSION: 1. Subacute appearing left posterior fourth through eighth rib fractures. Adjacent thickening appears to be largely subpleural without layering effusion or pneumothorax. Some minimal adjacent ground-glass could be atelectatic related to splinting versus resolving pulmonary contusive change. 2. Subacute appearing left L1-L3 transverse process fractures. 3. Minimal stranding adjacent the first and second portion of the duodenum without significant wall thickening, extraluminal gas or free  fluid. Findings could reflect a mild duodenitis or peptic ulcer disease in the appropriate clinical setting 4. Mildly lobular hepatic surface, suggestive of cirrhosis. Splenomegaly, could be an early manifestation of portal hypertension in the setting of cirrhosis. 5. 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 Anatomic variant. These results were called by telephone at the time of interpretation on 08/21/2020 at 6:10 am to provider [facility] , who verbally acknowledged these results. Electronically Signed   By: Lovena Le M.D.   On: 08/21/2020 06:10   US Abdomen Limited RUQ (LIVER/GB)  Result Date: 08/21/2020 CLINICAL DATA:  Pain of the abdomen in status post fall 3 weeks ago. EXAM: ULTRASOUND ABDOMEN LIMITED RIGHT UPPER QUADRANT COMPARISON:  CT abdomen pelvis August 21, 2020 FINDINGS: Gallbladder: No gallstones or wall thickening visualized. No sonographic Murphy sign noted by sonographer. Common bile duct: Diameter: 4 mm Liver: There is a 0.9 x 0.8 x 1 cm simple cyst in the right lobe liver correlating to CT finding. Within normal limits in parenchymal echogenicity. Portal vein is patent on color Doppler imaging with normal direction of blood  flow towards the liver. Other: None. IMPRESSION: No sonographic evidence of acute cholecystitis. Simple cyst right lobe liver. Electronically Signed   By: Abelardo Diesel M.D.   On: 08/21/2020 09:22         Discharge Exam: Vitals:   08/25/20 2200 08/26/20 0603  BP: 123/78 132/81  Pulse: 70 79  Resp: 18 16  Temp: 98.3 F (36.8 C) 98.3 F (36.8 C)  SpO2: 97% 98%   Vitals:   08/25/20 1512 08/25/20 2200 08/26/20 0500 08/26/20 0603  BP: 127/79 123/78  132/81  Pulse: 74 70  79  Resp: _0 Temp: 97.9 F (36.6 C) 98.3 F (36.8 C)  98.3 F (36.8 C)  TempSrc: Oral Oral  Oral  SpO2: 99% 97%  98%  Weight:   103.2 kg   Height:        General: Pt is alert, awake, not in acute  distress Cardiovascular: RRR, S1/S2 +, no rubs, no gallops Respiratory: CTA bilaterally, no wheezing, no rhonchi Abdominal: Soft, NT, ND, bowel sounds + Extremities: no edema, no cyanosis   The results of significant diagnostics from this hospitalization (including imaging, microbiology, ancillary and laboratory) are listed below for reference.    Significant Diagnostic Studies: DG Ribs Unilateral W/Chest Left  Result Date: 08/21/2020 CLINICAL DATA:  54 year old male with fall and left chest wall pain. EXAM: LEFT RIBS AND CHEST - 3+ VIEW COMPARISON:  None. FINDINGS: There multiple mildly displaced left posterior rib fractures involving the fourth-eighth ribs. No focal consolidation, pleural effusion, pneumothorax. The cardiac silhouette is within limits. IMPRESSION: Multiple mildly displaced left posterior rib fractures. No pneumothorax. Electronically Signed   By: Anner Crete M.D.   On: 08/21/2020 02:29   DG Thoracic Spine 2 View  Result Date: 08/21/2020 CLINICAL DATA:  55 year old male with fall. EXAM: THORACIC SPINE 2 VIEWS COMPARISON:  Chest radiograph dated 08/21/2020. FINDINGS: There is no acute fracture or subluxation of the thoracic spine. Multilevel degenerative changes and mild chronic compression fractures. Partially visualized multiple left posterior rib fractures. The soft tissues are unremarkable. IMPRESSION: 1. No acute fracture or subluxation of the thoracic spine. 2. Left posterior rib fractures. Electronically Signed   By: Anner Crete M.D.   On: 08/21/2020 02:31   CT Chest W Contrast  Result Date: 08/21/2020 CLINICAL DATA:  Fall from ladder 3 weeks ago, persistent back pain. EXAM: CT CHEST, ABDOMEN, AND PELVIS WITH CONTRAST TECHNIQUE: Multidetector CT imaging of the chest, abdomen and pelvis was performed following the standard protocol during bolus administration of intravenous contrast. CONTRAST:  163m OMNIPAQUE IOHEXOL 300 MG/ML  SOLN COMPARISON:  Radiographs  08/21/2020 FINDINGS: CT CHEST FINDINGS Cardiovascular: The aortic root is suboptimally assessed given cardiac pulsation artifact. The aorta is normal caliber. No acute luminal abnormality of the imaged aorta. No periaortic stranding or hemorrhage. Normal 3 vessel branching of the aortic arch. Proximal great vessels are unremarkable. Normal heart size. No pericardial effusion. Central pulmonary arteries are normal caliber no large central filling defects with more distal evaluation limited by non tailored examination. No major venous abnormalities are seen. Mediastinum/Nodes: No mediastinal fluid or gas. Normal thyroid gland and thoracic inlet. No acute abnormality of the trachea or esophagus. No worrisome mediastinal, hilar or axillary adenopathy. Lungs/Pleura: No acute traumatic abnormality of the lung parenchyma. Thickening adjacent the contiguous posterior left-sided rib fractures appears to be largely subpleural without layering effusion or pneumothorax. Some minimal adjacent ground-glass could be atelectatic or reflective atelectasis versus resolving pulmonary contusive change. Lungs are  otherwise clear. No consolidative process or edema. Musculoskeletal: Subacute appearing left posterior fourth through eighth rib fractures. Adjacent thickening appears largely subpleural, as above. No right-sided rib fractures. No other acute traumatic osseous injuries of the chest wall or imaged thoracic spine. Mild dextrocurvature of the mid to upper thoracic levels. CT ABDOMEN PELVIS FINDINGS Hepatobiliary: No direct hepatic injury or perihepatic hematoma. Solitary subcentimeter hypoattenuating focus in the right lobe liver (8/16), statistically likely benign. No worrisome focal liver lesions. Mildly lobular hepatic surface contour. Normal hepatic attenuation. Normal gallbladder and biliary tree. No significant biliary ductal dilatation or visible gallstones. Pancreas: No pancreatic ductal dilatation or surrounding  inflammatory changes. Spleen: Splenomegaly. No focal splenic lesions. No direct splenic injury or perisplenic hematoma. Adrenals/Urinary Tract: Normal adrenal glands. Kidneys are normally located with symmetric enhancementand excretion. Few subcentimeter hypoattenuating foci are too small to fully characterize. No suspicious renal lesion, urolithiasis or hydronephrosis. Traumatic or acute bladder abnormality is seen. Stomach/Bowel: Distal esophagus, stomach are unremarkable. Minimal stranding adjacent the first and second portion of the duodenum (2/65) without significant wall thickening, extraluminal gas or free fluid. No small bowel wall thickening or dilatation. No evidence of obstruction. A normal appendix is visualized. No colonic dilatation or wall thickening. Vascular/Lymphatic: No significant vascular findings are present. No enlarged abdominal or pelvic lymph nodes. Reproductive: The prostate and seminal vesicles are unremarkable. No acute abnormality of the external genitalia. Other: No abdominopelvic free fluid or free gas. No bowel containing hernias. Musculoskeletal: No acute osseous abnormality or suspicious osseous lesion. Subacute appearing left L1-L3 transverse process fractures. 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 Levocurvature of the lumbar spine, apex L4-5. Multilevel degenerative changes are present in the imaged portions of the spine. Bones of the pelvis are intact and congruent. Proximal femora are intact and normally located. IMPRESSION: 1. Subacute appearing left posterior fourth through eighth rib fractures. Adjacent thickening appears to be largely subpleural without layering effusion or pneumothorax. Some minimal adjacent ground-glass could be atelectatic related to splinting versus resolving pulmonary contusive change. 2. Subacute appearing left L1-L3 transverse process fractures. 3. Minimal stranding adjacent the first  and second portion of the duodenum without significant wall thickening, extraluminal gas or free fluid. Findings could reflect a mild duodenitis or peptic ulcer disease in the appropriate clinical setting 4. Mildly lobular hepatic surface, suggestive of cirrhosis. Splenomegaly, could be an early manifestation of portal hypertension in the setting of cirrhosis. 5. 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 Anatomic variant. These results were called by telephone at the time of interpretation on 08/21/2020 at 6:10 am to provider [facility] , who verbally acknowledged these results. Electronically Signed   By: Lovena Le M.D.   On: 08/21/2020 06:10   CT ABDOMEN PELVIS W CONTRAST  Result Date: 08/21/2020 CLINICAL DATA:  Fall from ladder 3 weeks ago, persistent back pain. EXAM: CT CHEST, ABDOMEN, AND PELVIS WITH CONTRAST TECHNIQUE: Multidetector CT imaging of the chest, abdomen and pelvis was performed following the standard protocol during bolus administration of intravenous contrast. CONTRAST:  171m OMNIPAQUE IOHEXOL 300 MG/ML  SOLN COMPARISON:  Radiographs 08/21/2020 FINDINGS: CT CHEST FINDINGS Cardiovascular: The aortic root is suboptimally assessed given cardiac pulsation artifact. The aorta is normal caliber. No acute luminal abnormality of the imaged aorta. No periaortic stranding or hemorrhage. Normal 3 vessel branching of the aortic arch. Proximal great vessels are unremarkable. Normal heart size. No pericardial effusion. Central pulmonary arteries are  normal caliber no large central filling defects with more distal evaluation limited by non tailored examination. No major venous abnormalities are seen. Mediastinum/Nodes: No mediastinal fluid or gas. Normal thyroid gland and thoracic inlet. No acute abnormality of the trachea or esophagus. No worrisome mediastinal, hilar or axillary adenopathy. Lungs/Pleura: No acute traumatic abnormality of  the lung parenchyma. Thickening adjacent the contiguous posterior left-sided rib fractures appears to be largely subpleural without layering effusion or pneumothorax. Some minimal adjacent ground-glass could be atelectatic or reflective atelectasis versus resolving pulmonary contusive change. Lungs are otherwise clear. No consolidative process or edema. Musculoskeletal: Subacute appearing left posterior fourth through eighth rib fractures. Adjacent thickening appears largely subpleural, as above. No right-sided rib fractures. No other acute traumatic osseous injuries of the chest wall or imaged thoracic spine. Mild dextrocurvature of the mid to upper thoracic levels. CT ABDOMEN PELVIS FINDINGS Hepatobiliary: No direct hepatic injury or perihepatic hematoma. Solitary subcentimeter hypoattenuating focus in the right lobe liver (8/16), statistically likely benign. No worrisome focal liver lesions. Mildly lobular hepatic surface contour. Normal hepatic attenuation. Normal gallbladder and biliary tree. No significant biliary ductal dilatation or visible gallstones. Pancreas: No pancreatic ductal dilatation or surrounding inflammatory changes. Spleen: Splenomegaly. No focal splenic lesions. No direct splenic injury or perisplenic hematoma. Adrenals/Urinary Tract: Normal adrenal glands. Kidneys are normally located with symmetric enhancementand excretion. Few subcentimeter hypoattenuating foci are too small to fully characterize. No suspicious renal lesion, urolithiasis or hydronephrosis. Traumatic or acute bladder abnormality is seen. Stomach/Bowel: Distal esophagus, stomach are unremarkable. Minimal stranding adjacent the first and second portion of the duodenum (2/65) without significant wall thickening, extraluminal gas or free fluid. No small bowel wall thickening or dilatation. No evidence of obstruction. A normal appendix is visualized. No colonic dilatation or wall thickening. Vascular/Lymphatic: No significant  vascular findings are present. No enlarged abdominal or pelvic lymph nodes. Reproductive: The prostate and seminal vesicles are unremarkable. No acute abnormality of the external genitalia. Other: No abdominopelvic free fluid or free gas. No bowel containing hernias. Musculoskeletal: No acute osseous abnormality or suspicious osseous lesion. Subacute appearing left L1-L3 transverse process fractures. 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 Levocurvature of the lumbar spine, apex L4-5. Multilevel degenerative changes are present in the imaged portions of the spine. Bones of the pelvis are intact and congruent. Proximal femora are intact and normally located. IMPRESSION: 1. Subacute appearing left posterior fourth through eighth rib fractures. Adjacent thickening appears to be largely subpleural without layering effusion or pneumothorax. Some minimal adjacent ground-glass could be atelectatic related to splinting versus resolving pulmonary contusive change. 2. Subacute appearing left L1-L3 transverse process fractures. 3. Minimal stranding adjacent the first and second portion of the duodenum without significant wall thickening, extraluminal gas or free fluid. Findings could reflect a mild duodenitis or peptic ulcer disease in the appropriate clinical setting 4. Mildly lobular hepatic surface, suggestive of cirrhosis. Splenomegaly, could be an early manifestation of portal hypertension in the setting of cirrhosis. 5. 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 Anatomic variant. These results were called by telephone at the time of interpretation on 08/21/2020 at 6:10 am to provider [facility] , who verbally acknowledged these results. Electronically Signed   By: Lovena Le M.D.   On: 08/21/2020 06:10   US Abdomen Limited RUQ (LIVER/GB)  Result Date: 08/21/2020 CLINICAL DATA:  Pain of the  abdomen in status post fall 3 weeks ago. EXAM: ULTRASOUND ABDOMEN  LIMITED RIGHT UPPER QUADRANT COMPARISON:  CT abdomen pelvis August 21, 2020 FINDINGS: Gallbladder: No gallstones or wall thickening visualized. No sonographic Murphy sign noted by sonographer. Common bile duct: Diameter: 4 mm Liver: There is a 0.9 x 0.8 x 1 cm simple cyst in the right lobe liver correlating to CT finding. Within normal limits in parenchymal echogenicity. Portal vein is patent on color Doppler imaging with normal direction of blood flow towards the liver. Other: None. IMPRESSION: No sonographic evidence of acute cholecystitis. Simple cyst right lobe liver. Electronically Signed   By: Abelardo Diesel M.D.   On: 08/21/2020 09:22     Microbiology: Recent Results (from the past 240 hour(s))  Resp Panel by RT-PCR (Flu A&B, Covid) Nasopharyngeal Swab     Status: None   Collection Time: 08/21/20  4:26 AM   Specimen: Nasopharyngeal Swab; Nasopharyngeal(NP) swabs in vial transport medium  Result Value Ref Range Status   SARS Coronavirus 2 by RT PCR NEGATIVE NEGATIVE Final    Comment: (NOTE) SARS-CoV-2 target nucleic acids are NOT DETECTED.  The SARS-CoV-2 RNA is generally detectable in upper respiratory specimens during the acute phase of infection. The lowest concentration of SARS-CoV-2 viral copies this assay can detect is 138 copies/mL. A negative result does not preclude SARS-Cov-2 infection and should not be used as the sole basis for treatment or other patient management decisions. A negative result may occur with  improper specimen collection/handling, submission of specimen other than nasopharyngeal swab, presence of viral mutation(s) within the areas targeted by this assay, and inadequate number of viral copies(<138 copies/mL). A negative result must be combined with clinical observations, patient history, and epidemiological information. The expected result is Negative.  Fact Sheet for Patients:   EntrepreneurPulse.com.au  Fact Sheet for Healthcare Providers:  IncredibleEmployment.be  This test is no t yet approved or cleared by the [place] FDA and  has been authorized for detection and/or diagnosis of SARS-CoV-2 by FDA under an Emergency Use Authorization (EUA). This EUA will remain  in effect (meaning this test can be used) for the duration of the COVID-19 declaration under Section 564(b)(1) of the Act, 21 U.S.C.section 360bbb-3(b)(1), unless the authorization is terminated  or revoked sooner.       Influenza A by PCR NEGATIVE NEGATIVE Final   Influenza B by PCR NEGATIVE NEGATIVE Final    Comment: (NOTE) The Xpert Xpress SARS-CoV-2/FLU/RSV plus assay is intended as an aid in the diagnosis of influenza from Nasopharyngeal swab specimens and should not be used as a sole basis for treatment. Nasal washings and aspirates are unacceptable for Xpert Xpress SARS-CoV-2/FLU/RSV testing.  Fact Sheet for Patients: EntrepreneurPulse.com.au  Fact Sheet for Healthcare Providers: IncredibleEmployment.be  This test is not yet approved or cleared by the [place] FDA and has been authorized for detection and/or diagnosis of SARS-CoV-2 by FDA under an Emergency Use Authorization (EUA). This EUA will remain in effect (meaning this test can be used) for the duration of the COVID-19 declaration under Section 564(b)(1) of the Act, 21 U.S.C. section 360bbb-3(b)(1), unless the authorization is terminated or revoked.  Performed at [facility], [address], [place], [place] [address]      Labs: Basic Metabolic Panel: Recent Labs  Lab 08/21/20 0317 08/22/20 0515 08/23/20 0636 08/24/20 0613 08/25/20 0606 08/26/20 0520  NA 133* 132* 134* 137 136 136  K 3.9 4.1 4.1 3.7 3.9 4.1  CL 100 102 100 102 105 102  CO2 _0 GLUCOSE 111*  98 104* 104* 85 83  BUN _0 CREATININE 0.72  0.51* 0.53* 0.55* 0.45* 0.50*  CALCIUM 8.8* 8.5* 8.9 8.7* 8.7* 8.7*  MG 1.9  --   --   --   --   --   PHOS 3.6  --   --   --   --   --    Liver Function Tests: Recent Labs  Lab 08/22/20 0515 08/23/20 0636 08/24/20 0613 08/25/20 0606 08/26/20 0520  AST 738* 911* 905* 900* 1,094*  ALT 687* 766* 746* 731* 809*  ALKPHOS 225* 231* 218* 200* 198*  BILITOT 14.6* 17.9* 18.5* 18.7* 20.6*  PROT 6.1* 6.2* 6.0* 5.9* 5.9*  ALBUMIN 2.7* 2.7* 2.6* 2.5* 2.4*   Recent Labs  Lab 08/21/20 0317  LIPASE 60*   No results for input(s): AMMONIA in the last 168 hours. CBC: Recent Labs  Lab 08/21/20 0317 08/22/20 0515 08/23/20 0636 08/24/20 0613 08/25/20 0606 08/26/20 0520  WBC 6.1 6.0 6.5 5.6 7.2 7.0  NEUTROABS 3.8  --   --   --   --   --   HGB 11.9* 11.3* 11.2* 11.1* 10.3* 10.5*  HCT 31.0* 31.6* 28.1* 30.1* RESULTS UNAVAILABLE DUE TO INTERFERING SUBSTANCE RESULTS UNAVAILABLE DUE TO INTERFERING SUBSTANCE  MCV 109.9* 108.6* 110.6* 109.1* RESULTS UNAVAILABLE DUE TO INTERFERING SUBSTANCE RESULTS UNAVAILABLE DUE TO INTERFERING SUBSTANCE  PLT 242 194 247 227 214 215   Cardiac Enzymes: No results for input(s): CKTOTAL, CKMB, CKMBINDEX, TROPONINI in the last 168 hours. BNP: Invalid input(s): POCBNP CBG: Recent Labs  Lab 08/23/20 1111 08/24/20 0748 08/25/20 0714 08/25/20 1119 08/26/20 0736  GLUCAP 96 101* 73 105* 71    Time coordinating discharge:  36 minutes  Signed:  Orson Eva, DO [facility] Pager: [phone] 08/26/2020, 2:46 PM

## 2020-08-27 LAB — HEPATITIS B SURFACE ANTIBODY, QUANTITATIVE: Hep B S AB Quant (Post): <3.1 m[IU]/mL — ABNORMAL LOW (ref >9.9)

## 2020-08-30 ENCOUNTER — Other Ambulatory Visit (HOSPITAL_COMMUNITY)
Admission: RE | Admit: 2020-08-30 | Discharge: 2020-08-30 | Disposition: A | Discharge status: Home / Self Care | Payer: Self-pay | Source: Ambulatory Visit | Attending: Gastroenterology | Admitting: Gastroenterology

## 2020-08-30 ENCOUNTER — Other Ambulatory Visit: Payer: Self-pay

## 2020-08-30 ENCOUNTER — Telehealth: Payer: Self-pay

## 2020-08-30 DIAGNOSIS — Z79899 Other long term (current) drug therapy: Secondary | ICD-10-CM | POA: Insufficient documentation

## 2020-08-30 DIAGNOSIS — R17 Unspecified jaundice: Secondary | ICD-10-CM

## 2020-08-30 DIAGNOSIS — B169 Acute hepatitis B without delta-agent and without hepatic coma: Secondary | ICD-10-CM

## 2020-08-30 LAB — HEPATIC FUNCTION PANEL
ALT: 889 U/L — ABNORMAL HIGH (ref 0–44)
AST: 940 U/L — ABNORMAL HIGH (ref 15–41)
Albumin: 2.9 g/dL — ABNORMAL LOW (ref 3.5–5.0)
Alkaline Phosphatase: 240 U/L — ABNORMAL HIGH (ref 38–126)
Bilirubin, Direct: 20.9 mg/dL — ABNORMAL HIGH (ref 0.0–0.2)
Indirect Bilirubin: 13.2 mg/dL — ABNORMAL HIGH (ref 0.3–0.9)
Total Bilirubin: 34.1 mg/dL (ref 0.3–1.2)
Total Protein: 7.8 g/dL (ref 6.5–8.1)

## 2020-08-30 NOTE — Telephone Encounter (Signed)
Wynelle [facility]) phoned in a critical lab for this pt. His Total Bilirubin is 34.1

## 2020-08-30 NOTE — Telephone Encounter (Signed)
Please make sure you hand off critical labs in person or verbally communicate with a provider. I was off work when this message was routed to me.   Called to speak with patient who was sleeping. Spoke with wife. Appetite has diminished over past two days. Patient drinking about 60-80 ounces of liquids. Sleeping a lot. No confusion. Awakens off/on and states he just feels tired.   Have discussed with Dr. Marletta Lor. Recommended watching for any signs of hepatic encephalopathy, also if unable to maintain nutrition/hydrate he should go to the ER. Otherwise, we will recheck his INR tomorrow to make sure no fulminant liver failure.   Instructed to take patient first thing in the morning to [facility] lab for stat labs.   CBC, CMET, PT/INR.

## 2020-08-30 NOTE — Addendum Note (Signed)
Addended by: Tiffany Kocher on: 08/30/2020 09:02 PM   Modules accepted: Orders

## 2020-08-31 ENCOUNTER — Other Ambulatory Visit: Payer: Self-pay

## 2020-08-31 ENCOUNTER — Inpatient Hospital Stay: Payer: Self-pay | Admitting: Internal Medicine

## 2020-08-31 ENCOUNTER — Other Ambulatory Visit (HOSPITAL_COMMUNITY)
Admission: RE | Admit: 2020-08-31 | Discharge: 2020-08-31 | Disposition: A | Discharge status: Home / Self Care | Payer: Self-pay | Source: Ambulatory Visit | Attending: Gastroenterology | Admitting: Gastroenterology

## 2020-08-31 ENCOUNTER — Encounter: Payer: Self-pay | Admitting: Gastroenterology

## 2020-08-31 ENCOUNTER — Telehealth: Payer: Self-pay

## 2020-08-31 DIAGNOSIS — B169 Acute hepatitis B without delta-agent and without hepatic coma: Secondary | ICD-10-CM

## 2020-08-31 DIAGNOSIS — R17 Unspecified jaundice: Secondary | ICD-10-CM

## 2020-08-31 DIAGNOSIS — Z79899 Other long term (current) drug therapy: Secondary | ICD-10-CM

## 2020-08-31 DIAGNOSIS — K219 Gastro-esophageal reflux disease without esophagitis: Secondary | ICD-10-CM

## 2020-08-31 LAB — COMPREHENSIVE METABOLIC PANEL
ALT: 790 U/L — ABNORMAL HIGH (ref 0–44)
AST: 780 U/L — ABNORMAL HIGH (ref 15–41)
Albumin: 2.9 g/dL — ABNORMAL LOW (ref 3.5–5.0)
Alkaline Phosphatase: 228 U/L — ABNORMAL HIGH (ref 38–126)
Anion gap: 10 (ref 5–15)
BUN: 11 mg/dL (ref 6–20)
CO2: 25 mmol/L (ref 22–32)
Calcium: 9.4 mg/dL (ref 8.9–10.3)
Chloride: 102 mmol/L (ref 98–111)
Creatinine, Ser: 0.4 mg/dL — ABNORMAL LOW (ref 0.61–1.24)
GFR, Estimated: >60 mL/min (ref >60)
Glucose, Bld: 100 mg/dL — ABNORMAL HIGH (ref 70–99)
Potassium: 3.7 mmol/L (ref 3.5–5.1)
Sodium: 137 mmol/L (ref 135–145)
Total Bilirubin: 34.5 mg/dL (ref 0.3–1.2)
Total Protein: 7.8 g/dL (ref 6.5–8.1)

## 2020-08-31 LAB — CBC WITH DIFFERENTIAL/PLATELET
Abs Immature Granulocytes: 0.04 10*3/uL (ref 0.00–0.07)
Basophils Absolute: 0 10*3/uL (ref 0.0–0.1)
Basophils Relative: 1 %
Eosinophils Absolute: 0.4 10*3/uL (ref 0.0–0.5)
Eosinophils Relative: 6 %
Hemoglobin: 12.4 g/dL — ABNORMAL LOW (ref 13.0–17.0)
Immature Granulocytes: 1 %
Lymphocytes Relative: 25 %
Lymphs Abs: 1.7 10*3/uL (ref 0.7–4.0)
Monocytes Absolute: 0.9 10*3/uL (ref 0.1–1.0)
Monocytes Relative: 12 %
Neutro Abs: 3.8 10*3/uL (ref 1.7–7.7)
Neutrophils Relative %: 55 %
Platelets: 307 10*3/uL (ref 150–400)
WBC: 6.9 10*3/uL (ref 4.0–10.5)
nRBC: 0 % (ref 0.0–0.2)

## 2020-08-31 LAB — PROTIME-INR
INR: 1 (ref 0.8–1.2)
Prothrombin Time: 12.8 seconds (ref 11.4–15.2)

## 2020-08-31 NOTE — Telephone Encounter (Signed)
t bili 34.5.   See result note for patient instructions. Need additional labs tomorrow STAT at [facility].

## 2020-08-31 NOTE — Telephone Encounter (Signed)
NOTED;    Labs re-done and sent to [facility] lab

## 2020-08-31 NOTE — Telephone Encounter (Signed)
Received a critical lab call from [facility] lab. Pt's Bilirubin is 3435. Pt completed labs this morning.

## 2020-08-31 NOTE — Telephone Encounter (Signed)
Noted  

## 2020-09-01 ENCOUNTER — Other Ambulatory Visit: Payer: Self-pay

## 2020-09-01 ENCOUNTER — Other Ambulatory Visit (HOSPITAL_COMMUNITY)
Admission: RE | Admit: 2020-09-01 | Discharge: 2020-09-01 | Disposition: A | Discharge status: Home / Self Care | Payer: Self-pay | Source: Ambulatory Visit | Attending: Gastroenterology | Admitting: Gastroenterology

## 2020-09-01 DIAGNOSIS — Z79899 Other long term (current) drug therapy: Secondary | ICD-10-CM | POA: Insufficient documentation

## 2020-09-01 DIAGNOSIS — R17 Unspecified jaundice: Secondary | ICD-10-CM | POA: Insufficient documentation

## 2020-09-01 LAB — PROTIME-INR
INR: 1 (ref 0.8–1.2)
Prothrombin Time: 13 seconds (ref 11.4–15.2)

## 2020-09-02 ENCOUNTER — Other Ambulatory Visit (HOSPITAL_COMMUNITY)
Admission: RE | Admit: 2020-09-02 | Discharge: 2020-09-02 | Disposition: A | Discharge status: Home / Self Care | Payer: Self-pay | Source: Ambulatory Visit | Attending: Gastroenterology | Admitting: Gastroenterology

## 2020-09-02 ENCOUNTER — Other Ambulatory Visit: Payer: Self-pay

## 2020-09-02 ENCOUNTER — Telehealth: Payer: Self-pay

## 2020-09-02 DIAGNOSIS — B169 Acute hepatitis B without delta-agent and without hepatic coma: Secondary | ICD-10-CM

## 2020-09-02 DIAGNOSIS — R17 Unspecified jaundice: Secondary | ICD-10-CM

## 2020-09-02 DIAGNOSIS — Z79899 Other long term (current) drug therapy: Secondary | ICD-10-CM | POA: Insufficient documentation

## 2020-09-02 LAB — HEPATIC FUNCTION PANEL
ALT: 507 U/L — ABNORMAL HIGH (ref 0–44)
AST: 445 U/L — ABNORMAL HIGH (ref 15–41)
Albumin: 2.8 g/dL — ABNORMAL LOW (ref 3.5–5.0)
Alkaline Phosphatase: 221 U/L — ABNORMAL HIGH (ref 38–126)
Bilirubin, Direct: 24.9 mg/dL — ABNORMAL HIGH (ref 0.0–0.2)
Total Bilirubin: 35.5 mg/dL (ref 0.3–1.2)
Total Protein: 7.8 g/dL (ref 6.5–8.1)

## 2020-09-02 LAB — PROTIME-INR
INR: 1 (ref 0.8–1.2)
Prothrombin Time: 12.9 seconds (ref 11.4–15.2)

## 2020-09-02 LAB — AMMONIA: Ammonia: 34 umol/L (ref 9–35)

## 2020-09-02 MED ORDER — CHOLESTYRAMINE 4 G PO PACK: PACK | ORAL | 1 refills | Status: DC

## 2020-09-02 NOTE — Telephone Encounter (Signed)
Noted  

## 2020-09-02 NOTE — Telephone Encounter (Addendum)
call from lab on this pt that his total bilirubin is 35.5

## 2020-09-02 NOTE — Addendum Note (Signed)
Addended by: Tiffany Kocher on: 09/02/2020 12:44 PM   Modules accepted: Orders

## 2020-09-02 NOTE — Telephone Encounter (Signed)
Agree with Anna's assessment.   Please let pt know that his ammonia and INR are normal. This is good. His other LFTs are all better except the bilirubin which tends to be the last one to get better.   His AST down from 780 to 445, ALT down from 790 to 507, AP 228 to 221, bilirubin 35.5.  HE NEEDS TO REPEAT TOMORROW STAT AT [facility]. LFTS AND PT/INR. IF STILL IMPROVING THEN WE WILL BACK OFF TO LIKELY WEEKLY CHECKS.   ER PRECAUTIONS, IF UNABLE TO HYDRATE, EAT, OR IF BECOMING CONFUSED/FEVER/LETHARGIC. TO TO ER IF ANY OF ABOVE.

## 2020-09-02 NOTE — Telephone Encounter (Signed)
Forward to [place], who is in hospital today. I messaged to update her. Appears transaminases are improving, bilirubin holding steady and likely to have more of a lag effect.  Lab Results  Component Value Date   ALT 507 (H) 09/02/2020   AST 445 (H) 09/02/2020   ALKPHOS 221 (H) 09/02/2020   BILITOT 35.5 (HH) 09/02/2020

## 2020-09-02 NOTE — Telephone Encounter (Signed)
Itching likely due to cholestasis. First line tx is Lanetta Inch but will have to be careful as this can cause constipation.   Start 4 grams daily (do not take within 2 hours of other medications).   rx sent.

## 2020-09-02 NOTE — Telephone Encounter (Signed)
Phoned and spoke with the pt's wife and advised her of Rx and instructions along with the precaution taking it within 2 hours of other medications. They agreed.

## 2020-09-02 NOTE — Telephone Encounter (Signed)
Phoned and advised the pt of his results and the need to repeat tomorrow. Advised if levels continue to improve we will go to weekly checks. Advised the pt of ER precautions and what to do. Pt stated he get's nauseated every once in awhile. The pt asks if you can prescribed him something for itching. He's been itching all night and all over especially his back. Pt doesn't know why.

## 2020-09-03 ENCOUNTER — Other Ambulatory Visit: Payer: Self-pay

## 2020-09-03 ENCOUNTER — Telehealth (INDEPENDENT_AMBULATORY_CARE_PROVIDER_SITE_OTHER): Payer: Self-pay | Admitting: Gastroenterology

## 2020-09-03 ENCOUNTER — Other Ambulatory Visit (HOSPITAL_COMMUNITY)
Admission: RE | Admit: 2020-09-03 | Discharge: 2020-09-03 | Disposition: A | Discharge status: Home / Self Care | Payer: Self-pay | Source: Ambulatory Visit | Attending: Gastroenterology | Admitting: Gastroenterology

## 2020-09-03 DIAGNOSIS — R17 Unspecified jaundice: Secondary | ICD-10-CM | POA: Insufficient documentation

## 2020-09-03 DIAGNOSIS — B169 Acute hepatitis B without delta-agent and without hepatic coma: Secondary | ICD-10-CM | POA: Insufficient documentation

## 2020-09-03 LAB — HEPATIC FUNCTION PANEL
ALT: 417 U/L — ABNORMAL HIGH (ref 0–44)
AST: 335 U/L — ABNORMAL HIGH (ref 15–41)
Albumin: 3 g/dL — ABNORMAL LOW (ref 3.5–5.0)
Alkaline Phosphatase: 233 U/L — ABNORMAL HIGH (ref 38–126)
Bilirubin, Direct: >30 mg/dL — ABNORMAL HIGH (ref 0.0–0.2)
Total Bilirubin: 36.1 mg/dL (ref 0.3–1.2)
Total Protein: 8.1 g/dL (ref 6.5–8.1)

## 2020-09-03 LAB — PROTIME-INR
INR: 1 (ref 0.8–1.2)
Prothrombin Time: 12.9 seconds (ref 11.4–15.2)

## 2020-09-03 NOTE — Telephone Encounter (Signed)
received call from the lab stating his TBili was 36.1. Aminotransferases from today are trending down, as well as he has a normal INR. Will monitor, will follow in clinic with T[facility] [place].  Katrinka Blazing, MD Gastroenterology and Hepatology [facility]

## 2020-09-06 ENCOUNTER — Encounter (HOSPITAL_COMMUNITY): Payer: Self-pay | Admitting: Emergency Medicine

## 2020-09-06 ENCOUNTER — Emergency Department (HOSPITAL_COMMUNITY): Payer: Self-pay

## 2020-09-06 ENCOUNTER — Inpatient Hospital Stay (HOSPITAL_COMMUNITY)
Admission: EM | Admit: 2020-09-06 | Discharge: 2020-09-08 | Disposition: A | Discharge status: Home / Self Care | Payer: Self-pay | Attending: Internal Medicine | Admitting: Internal Medicine

## 2020-09-06 ENCOUNTER — Other Ambulatory Visit: Payer: Self-pay

## 2020-09-06 DIAGNOSIS — Z6833 Body mass index (BMI) 33.0-33.9, adult: Secondary | ICD-10-CM | POA: Insufficient documentation

## 2020-09-06 DIAGNOSIS — M51369 Other intervertebral disc degeneration, lumbar region without mention of lumbar back pain or lower extremity pain: Secondary | ICD-10-CM | POA: Diagnosis present

## 2020-09-06 DIAGNOSIS — E669 Obesity, unspecified: Secondary | ICD-10-CM | POA: Insufficient documentation

## 2020-09-06 DIAGNOSIS — Z8619 Personal history of other infectious and parasitic diseases: Secondary | ICD-10-CM | POA: Insufficient documentation

## 2020-09-06 DIAGNOSIS — G4733 Obstructive sleep apnea (adult) (pediatric): Secondary | ICD-10-CM | POA: Insufficient documentation

## 2020-09-06 DIAGNOSIS — Z20822 Contact with and (suspected) exposure to covid-19: Secondary | ICD-10-CM | POA: Insufficient documentation

## 2020-09-06 DIAGNOSIS — S2239XA Fracture of one rib, unspecified side, initial encounter for closed fracture: Secondary | ICD-10-CM | POA: Diagnosis present

## 2020-09-06 DIAGNOSIS — R1110 Vomiting, unspecified: Secondary | ICD-10-CM

## 2020-09-06 DIAGNOSIS — R17 Unspecified jaundice: Secondary | ICD-10-CM

## 2020-09-06 DIAGNOSIS — S32039A Unspecified fracture of third lumbar vertebra, initial encounter for closed fracture: Secondary | ICD-10-CM | POA: Insufficient documentation

## 2020-09-06 DIAGNOSIS — S2242XA Multiple fractures of ribs, left side, initial encounter for closed fracture: Secondary | ICD-10-CM | POA: Insufficient documentation

## 2020-09-06 DIAGNOSIS — A5149 Other secondary syphilitic conditions: Secondary | ICD-10-CM

## 2020-09-06 DIAGNOSIS — R109 Unspecified abdominal pain: Secondary | ICD-10-CM | POA: Insufficient documentation

## 2020-09-06 DIAGNOSIS — R112 Nausea with vomiting, unspecified: Principal | ICD-10-CM | POA: Diagnosis present

## 2020-09-06 DIAGNOSIS — Z79899 Other long term (current) drug therapy: Secondary | ICD-10-CM | POA: Insufficient documentation

## 2020-09-06 DIAGNOSIS — M5136 Other intervertebral disc degeneration, lumbar region: Secondary | ICD-10-CM | POA: Insufficient documentation

## 2020-09-06 DIAGNOSIS — B1910 Unspecified viral hepatitis B without hepatic coma: Secondary | ICD-10-CM | POA: Insufficient documentation

## 2020-09-06 DIAGNOSIS — S32029A Unspecified fracture of second lumbar vertebra, initial encounter for closed fracture: Secondary | ICD-10-CM | POA: Insufficient documentation

## 2020-09-06 DIAGNOSIS — Z87891 Personal history of nicotine dependence: Secondary | ICD-10-CM | POA: Insufficient documentation

## 2020-09-06 DIAGNOSIS — K759 Inflammatory liver disease, unspecified: Secondary | ICD-10-CM | POA: Diagnosis present

## 2020-09-06 DIAGNOSIS — F32A Depression, unspecified: Secondary | ICD-10-CM | POA: Insufficient documentation

## 2020-09-06 DIAGNOSIS — K219 Gastro-esophageal reflux disease without esophagitis: Secondary | ICD-10-CM | POA: Diagnosis present

## 2020-09-06 DIAGNOSIS — I10 Essential (primary) hypertension: Secondary | ICD-10-CM | POA: Diagnosis present

## 2020-09-06 DIAGNOSIS — W19XXXA Unspecified fall, initial encounter: Secondary | ICD-10-CM | POA: Insufficient documentation

## 2020-09-06 DIAGNOSIS — S32019A Unspecified fracture of first lumbar vertebra, initial encounter for closed fracture: Secondary | ICD-10-CM | POA: Insufficient documentation

## 2020-09-06 HISTORY — DX: Syphilis, unspecified: A53.9

## 2020-09-06 HISTORY — DX: Unspecified viral hepatitis B without hepatic coma: B19.10

## 2020-09-06 LAB — COMPREHENSIVE METABOLIC PANEL
ALT: 240 U/L — ABNORMAL HIGH (ref 0–44)
AST: 204 U/L — ABNORMAL HIGH (ref 15–41)
Albumin: 3.4 g/dL — ABNORMAL LOW (ref 3.5–5.0)
Alkaline Phosphatase: 234 U/L — ABNORMAL HIGH (ref 38–126)
Anion gap: 14 (ref 5–15)
BUN: 11 mg/dL (ref 6–20)
CO2: 23 mmol/L (ref 22–32)
Calcium: 9.7 mg/dL (ref 8.9–10.3)
Chloride: 98 mmol/L (ref 98–111)
Creatinine, Ser: 0.3 mg/dL — ABNORMAL LOW (ref 0.61–1.24)
GFR, Estimated: >60 mL/min (ref >60)
Glucose, Bld: 126 mg/dL — ABNORMAL HIGH (ref 70–99)
Potassium: 3.5 mmol/L (ref 3.5–5.1)
Sodium: 135 mmol/L (ref 135–145)
Total Bilirubin: 38 mg/dL (ref 0.3–1.2)
Total Protein: 9.1 g/dL — ABNORMAL HIGH (ref 6.5–8.1)

## 2020-09-06 LAB — BRAIN NATRIURETIC PEPTIDE: B Natriuretic Peptide: 26 pg/mL (ref 0.0–100.0)

## 2020-09-06 LAB — URINALYSIS, ROUTINE W REFLEX MICROSCOPIC
Glucose, UA: 50 mg/dL — AB
Hgb urine dipstick: NEGATIVE
Ketones, ur: 5 mg/dL — AB
Leukocytes,Ua: NEGATIVE
Nitrite: NEGATIVE
Protein, ur: 30 mg/dL — AB
Specific Gravity, Urine: 1.02 (ref 1.005–1.030)
pH: 6 (ref 5.0–8.0)

## 2020-09-06 LAB — CBG MONITORING, ED: Glucose-Capillary: 109 mg/dL — ABNORMAL HIGH (ref 70–99)

## 2020-09-06 LAB — CBC WITH DIFFERENTIAL/PLATELET
Basophils Absolute: 0.1 10*3/uL (ref 0.0–0.1)
Basophils Relative: 1 %
Eosinophils Absolute: 0.2 10*3/uL (ref 0.0–0.5)
Eosinophils Relative: 2 %
Hemoglobin: 12.8 g/dL — ABNORMAL LOW (ref 13.0–17.0)
Lymphocytes Relative: 15 %
Lymphs Abs: 2 10*3/uL (ref 0.7–4.0)
Monocytes Absolute: 1 10*3/uL (ref 0.1–1.0)
Monocytes Relative: 8 %
Neutro Abs: 8 10*3/uL — ABNORMAL HIGH (ref 1.7–7.7)
Neutrophils Relative %: 74 %
Platelets: 366 10*3/uL (ref 150–400)
WBC: 10.7 10*3/uL — ABNORMAL HIGH (ref 4.0–10.5)
nRBC: 0 % (ref 0.0–0.2)

## 2020-09-06 LAB — AMMONIA: Ammonia: 21 umol/L (ref 9–35)

## 2020-09-06 LAB — LIPASE, BLOOD: Lipase: 70 U/L — ABNORMAL HIGH (ref 11–51)

## 2020-09-06 LAB — TROPONIN I (HIGH SENSITIVITY): Troponin I (High Sensitivity): 4 ng/L (ref <18)

## 2020-09-06 LAB — PROTIME-INR
INR: 1.1 (ref 0.8–1.2)
Prothrombin Time: 13.3 seconds (ref 11.4–15.2)

## 2020-09-06 LAB — SARS CORONAVIRUS 2 (TAT 6-24 HRS): SARS Coronavirus 2: NEGATIVE

## 2020-09-06 MED ORDER — VENLAFAXINE HCL ER 75 MG PO CP24
150.0000 mg | ORAL_CAPSULE | Freq: Every day | ORAL | Status: DC
  Administered 2020-09-06 – 2020-09-08 (×3): 150 mg via ORAL
  Filled 2020-09-06: qty 4
  Filled 2020-09-06 (×2): qty 2

## 2020-09-06 MED ORDER — ONDANSETRON HCL 4 MG/2ML IJ SOLN: 4.0000 mg | Freq: Four times a day (QID) | INTRAMUSCULAR | Status: DC | PRN

## 2020-09-06 MED ORDER — IOHEXOL 300 MG/ML  SOLN
100.0000 mL | Freq: Once | INTRAMUSCULAR | Status: AC | PRN
  Administered 2020-09-06: 100 mL via INTRAVENOUS

## 2020-09-06 MED ORDER — ONDANSETRON HCL 4 MG/2ML IJ SOLN
4.0000 mg | Freq: Once | INTRAMUSCULAR | Status: AC
  Administered 2020-09-06: 4 mg via INTRAVENOUS
  Filled 2020-09-06: qty 2

## 2020-09-06 MED ORDER — MAGNESIUM CITRATE PO SOLN: 1.0000 | Freq: Once | ORAL | Status: DC | PRN

## 2020-09-06 MED ORDER — ADULT MULTIVITAMIN W/MINERALS CH
1.0000 | ORAL_TABLET | Freq: Every day | ORAL | Status: DC
  Administered 2020-09-06 – 2020-09-08 (×3): 1 via ORAL
  Filled 2020-09-06 (×3): qty 1

## 2020-09-06 MED ORDER — IPRATROPIUM BROMIDE 0.02 % IN SOLN: 0.5000 mg | Freq: Four times a day (QID) | RESPIRATORY_TRACT | Status: DC | PRN

## 2020-09-06 MED ORDER — SODIUM CHLORIDE 0.9% FLUSH
3.0000 mL | INTRAVENOUS | Status: DC | PRN
  Administered 2020-09-07: 3 mL via INTRAVENOUS

## 2020-09-06 MED ORDER — CHOLESTYRAMINE 4 G PO PACK
4.0000 g | PACK | Freq: Three times a day (TID) | ORAL | Status: DC
  Administered 2020-09-06 (×3): 4 g via ORAL
  Filled 2020-09-06 (×8): qty 1

## 2020-09-06 MED ORDER — MORPHINE SULFATE (PF) 4 MG/ML IV SOLN
4.0000 mg | Freq: Once | INTRAVENOUS | Status: AC
Start: 2020-09-06 — End: 2020-09-06
  Administered 2020-09-06: 4 mg via INTRAVENOUS
  Filled 2020-09-06: qty 1

## 2020-09-06 MED ORDER — HYDROMORPHONE HCL 1 MG/ML IJ SOLN
0.5000 mg | INTRAMUSCULAR | Status: DC | PRN
Start: 2020-09-06 — End: 2020-09-08

## 2020-09-06 MED ORDER — SODIUM CHLORIDE 0.9 % IV SOLN
25.0000 mg | Freq: Once | INTRAVENOUS | Status: AC
  Administered 2020-09-06: 25 mg via INTRAVENOUS
  Filled 2020-09-06: qty 1

## 2020-09-06 MED ORDER — HEPARIN SODIUM (PORCINE) 5000 UNIT/ML IJ SOLN
5000.0000 [IU] | Freq: Three times a day (TID) | INTRAMUSCULAR | Status: DC
  Administered 2020-09-06 – 2020-09-08 (×6): 5000 [IU] via SUBCUTANEOUS
  Filled 2020-09-06 (×6): qty 1

## 2020-09-06 MED ORDER — SODIUM CHLORIDE 0.9% FLUSH
3.0000 mL | Freq: Two times a day (BID) | INTRAVENOUS | Status: DC
  Administered 2020-09-07 – 2020-09-08 (×3): 3 mL via INTRAVENOUS

## 2020-09-06 MED ORDER — SODIUM CHLORIDE 0.9% FLUSH
3.0000 mL | Freq: Two times a day (BID) | INTRAVENOUS | Status: DC
  Administered 2020-09-06 – 2020-09-08 (×4): 3 mL via INTRAVENOUS

## 2020-09-06 MED ORDER — ONDANSETRON HCL 4 MG PO TABS
4.0000 mg | ORAL_TABLET | Freq: Four times a day (QID) | ORAL | Status: DC | PRN
Start: 2020-09-06 — End: 2020-09-08

## 2020-09-06 MED ORDER — LEVALBUTEROL HCL 0.63 MG/3ML IN NEBU: 0.6300 mg | INHALATION_SOLUTION | Freq: Four times a day (QID) | RESPIRATORY_TRACT | Status: DC | PRN

## 2020-09-06 MED ORDER — BISACODYL 5 MG PO TBEC: 5.0000 mg | DELAYED_RELEASE_TABLET | Freq: Every day | ORAL | Status: DC | PRN

## 2020-09-06 MED ORDER — SODIUM CHLORIDE 0.9 % IV SOLN: INTRAVENOUS | Status: AC

## 2020-09-06 MED ORDER — IBUPROFEN 600 MG PO TABS: 600.0000 mg | ORAL_TABLET | Freq: Four times a day (QID) | ORAL | Status: DC | PRN

## 2020-09-06 MED ORDER — SODIUM CHLORIDE 0.9 % IV BOLUS
1000.0000 mL | Freq: Once | INTRAVENOUS | Status: AC
  Administered 2020-09-06: 1000 mL via INTRAVENOUS

## 2020-09-06 MED ORDER — SODIUM CHLORIDE 0.9 % IV SOLN: 250.0000 mL | INTRAVENOUS | Status: DC | PRN

## 2020-09-06 MED ORDER — HYDRALAZINE HCL 20 MG/ML IJ SOLN: 10.0000 mg | INTRAMUSCULAR | Status: DC | PRN

## 2020-09-06 MED ORDER — OXYCODONE HCL 5 MG PO TABS
5.0000 mg | ORAL_TABLET | ORAL | Status: DC | PRN
  Administered 2020-09-07: 5 mg via ORAL
  Filled 2020-09-06: qty 1

## 2020-09-06 MED ORDER — SENNOSIDES-DOCUSATE SODIUM 8.6-50 MG PO TABS: 1.0000 | ORAL_TABLET | Freq: Every evening | ORAL | Status: DC | PRN

## 2020-09-06 NOTE — ED Notes (Signed)
Pt given cup of ice water for fluid challenge, pt is reluctant to try due to amount of vomiting over last few hours.

## 2020-09-06 NOTE — Telephone Encounter (Signed)
Patient currently inpatient.

## 2020-09-06 NOTE — Plan of Care (Signed)
  Problem: Acute Rehab PT Goals(only PT should resolve) Goal: Pt/caregiver will Perform Home Exercise Program Description: Daily ambulation on the unit while hospitalized, no assistance required Outcome: Completed/Met   4:44 PM, 09/06/20 M. Sherlyn Lees, PT, DPT Physical Therapist- [place] Office Number: [phone]

## 2020-09-06 NOTE — ED Notes (Signed)
Pt walked in room and oxygen went to 95%

## 2020-09-06 NOTE — H&P (Signed)
History and Physical   Patient: Brandon Lane                            PCP: Janith Lima, MD                    DOB: August 22, 1965            DOA: 09/06/2020 TXM:[id number]             DOS: 09/06/2020, 8:12 AM  Patient coming from:   HOME  I have personally reviewed patient's medical records, in electronic medical records, including:  [facility], and care everywhere.    Chief Complaint:   Chief Complaint  Patient presents with  . Emesis    History of present illness:    Brandon Lane is a 55 y.o. 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  Presenting to the ED with intractable nausea vomiting since yesterday. Last meal was yesterday unable to tolerate p.o. now.  Vomitus has been nonbloody, nonbilious. Denied diarrhea.  Complaining of right lower quadrant, periumbilical abdominal discomfort   Patient Denies having: Fever, Chills, Cough, SOB, Chest Pain, Abd pain, N/V/D, headache, dizziness, lightheadedness,  Dysuria, Joint pain, rash, open wounds  ED Course:   Vitals: Abnormal labs; alk phos 234, albumin 3.64, lipase 70, AST 204, ALT 240, bilirubin 38.8,  bilirubin direct  30.0, UA positive for moderate bilirubin glucose ketones of 5 protein of 30 rare bacteria WBC of 21-50  CT abdomen/pelvis: -- IMPRESSION: 1. Largely stable CT appearance of the abdomen and pelvis from last month. No new abnormality identified.  2. Splenomegaly with suspected chronic liver disease, and likely hepatitis related mild portacaval lymphadenopathy.  3. Resolving possibly posttraumatic mesenteric inflammation adjacent to the duodenum. Subacute left rib and lumbar transverse process fractures.   Review of Systems: As per HPI, otherwise 10 point review of systems were  negative.   ----------------------------------------------------------------------------------------------------------------------  No Known Allergies  Home MEDs:  Prior to Admission medications   Medication Sig Start Date End Date Taking? Authorizing Provider  cholestyramine (QUESTRAN) 4 g packet Take one packet daily for itching. Do not take within two hours of other medications. 09/02/20   Mahala Menghini, PA-C  oxyCODONE (OXY IR/ROXICODONE) 5 MG immediate release tablet Take 1 tablet (5 mg total) by mouth every 4 (four) hours as needed for moderate pain. 08/26/20   Orson Eva, MD  venlafaxine XR (EFFEXOR-XR) 150 MG 24 hr capsule TAKE 1 CAPSULE BY MOUTH DAILY WITH BREAKFAST. Patient taking differently: Take 150 mg by mouth daily with breakfast. 04/08/20   Janith Lima, MD    PRN MEDs: sodium chloride, bisacodyl, hydrALAZINE, HYDROmorphone (DILAUDID) injection, ipratropium, levalbuterol, magnesium citrate, ondansetron **OR** ondansetron (ZOFRAN) IV, oxyCODONE, senna-docusate, sodium chloride flush  Past Medical History:  Diagnosis Date  . DDD (degenerative disc disease), lumbar   . Depression   . GERD (gastroesophageal reflux disease)   . Hepatitis B   . Syphilis     History reviewed. No pertinent surgical history.   reports that he quit smoking about 21 years ago. His smoking use included cigarettes. He has a 20.00 pack-year smoking history. He has never used smokeless tobacco. He reports current alcohol use of about 10.0 standard drinks of alcohol per week. He reports that he  does not use drugs.   Family History  Problem Relation Age of Onset  . Arthritis Mother   . Hyperlipidemia Mother   . Stroke Mother   . Hypertension Mother   . Kidney disease Mother   . Diabetes Mother   . Alcohol abuse Father   . Hypertension Sister   . Cancer Neg Hx   . Early death Neg Hx   . Heart disease Brother   . Hypertension Brother   . Stroke Brother     Physical Exam:   Vitals:    09/06/20 0500 09/06/20 0530 09/06/20 0600 09/06/20 0620  BP: 122/72  128/69 128/74  Pulse: 79 72 65 78  Resp: _0 Temp:      SpO2: 98% 100% 97% 100%   Constitutional: NAD, calm, comfortable Eyes: PERRL, lids and conjunctivae normal, anicteric sclera ENMT: Mucous membranes are moist. Posterior pharynx clear of any exudate or lesions.Normal dentition.  Neck: normal, supple, no masses, no thyromegaly Respiratory: clear to auscultation bilaterally, no wheezing, no crackles. Normal respiratory effort. No accessory muscle use.  Cardiovascular: Regular rate and rhythm, no murmurs / rubs / gallops. No extremity edema. 2+ pedal pulses. No carotid bruits.  Abdomen: Diffuse abdominal tenderness significant epigastric area, no masses palpated. No hepatosplenomegaly. Bowel sounds positive.  Musculoskeletal: no clubbing / cyanosis. No joint deformity upper and lower extremities. Good ROM, no contractures. Normal muscle tone.  Neurologic: CN II-XII grossly intact. Sensation intact, DTR normal. Strength 5/5 in all 4.  Psychiatric: Normal judgment and insight. Alert and oriented x 3. Normal mood.  Skin: African jaundice, with icterus no rashes, lesions, ulcers. No induration Wounds: per nursing documentation      Labs on admission:    I have personally reviewed following labs and imaging studies  CBC: Recent Labs  Lab 08/31/20 1058 09/06/20 0243  WBC 6.9 10.7*  NEUTROABS 3.8 8.0*  HGB 12.4* 12.8*  HCT RESULTS UNAVAILABLE DUE TO INTERFERING SUBSTANCE RESULTS UNAVAILABLE DUE TO INTERFERING SUBSTANCE  MCV RESULTS UNAVAILABLE DUE TO INTERFERING SUBSTANCE RESULTS UNAVAILABLE DUE TO INTERFERING SUBSTANCE  PLT 307 979   Basic Metabolic Panel: Recent Labs  Lab 08/31/20 1057 09/06/20 0242  NA 137 135  K 3.7 3.5  CL 102 98  CO2 25 23  GLUCOSE 100* 126*  BUN 11 11  CREATININE 0.40* 0.30*  CALCIUM 9.4 9.7   GFR: Estimated Creatinine Clearance: 126.4 mL/min (A) (by C-G formula based  on SCr of 0.3 mg/dL (L)). Liver Function Tests: Recent Labs  Lab 08/30/20 1459 08/31/20 1057 09/02/20 0825 09/03/20 1314 09/06/20 0242  AST 940* 780* 445* 335* 204*  ALT 889* 790* 507* 417* 240*  ALKPHOS 240* 228* 221* 233* 234*  BILITOT 34.1* 34.5* 35.5* 36.1* 38.0*  PROT 7.8 7.8 7.8 8.1 9.1*  ALBUMIN 2.9* 2.9* 2.8* 3.0* 3.4*   Recent Labs  Lab 09/06/20 0242  LIPASE 70*   Recent Labs  Lab 09/02/20 0825 09/06/20 0242  AMMONIA 34 21   Coagulation Profile: Recent Labs  Lab 08/31/20 1058 09/01/20 1022 09/02/20 0825 09/03/20 1314 09/06/20 0242  INR 1.0 1.0 1.0 1.0 1.1   Cardiac Enzymes: No results for input(s): CKTOTAL, CKMB, CKMBINDEX, TROPONINI in the last 168 hours. BNP (last 3 results) No results for input(s): PROBNP in the last 8760 hours. HbA1C: No results for input(s): HGBA1C in the last 72 hours. CBG: No results for input(s): GLUCAP in the last 168 hours. Lipid Profile: No results for input(s): CHOL, HDL, LDLCALC, TRIG, CHOLHDL, LDLDIRECT  in the last 72 hours. Thyroid Function Tests: No results for input(s): TSH, T4TOTAL, FREET4, T3FREE, THYROIDAB in the last 72 hours. Anemia Panel: No results for input(s): VITAMINB12, FOLATE, FERRITIN, TIBC, IRON, RETICCTPCT in the last 72 hours. Urine analysis:    Component Value Date/Time   COLORURINE AMBER (A) 09/06/2020 0118   APPEARANCEUR HAZY (A) 09/06/2020 0118   LABSPEC 1.020 09/06/2020 0118   PHURINE 6.0 09/06/2020 0118   GLUCOSEU 50 (A) 09/06/2020 0118   GLUCOSEU NEGATIVE 05/19/2020 1106   HGBUR NEGATIVE 09/06/2020 0118   BILIRUBINUR MODERATE (A) 09/06/2020 0118   KETONESUR 5 (A) 09/06/2020 0118   PROTEINUR 30 (A) 09/06/2020 0118   UROBILINOGEN >=8.0 (A) 05/19/2020 1106   NITRITE NEGATIVE 09/06/2020 0118   LEUKOCYTESUR NEGATIVE 09/06/2020 0118     Radiologic Exams on Admission:   CT ABDOMEN PELVIS W CONTRAST  Result Date: 09/06/2020 CLINICAL DATA:  55 year old male with right abdominal pain and  jaundice. Vomiting. Recent diagnosis of hepatitis-B. EXAM: CT ABDOMEN AND PELVIS WITH CONTRAST TECHNIQUE: Multidetector CT imaging of the abdomen and pelvis was performed using the standard protocol following bolus administration of intravenous contrast. CONTRAST:  132m OMNIPAQUE IOHEXOL 300 MG/ML  SOLN COMPARISON:  CT Chest, Abdomen, and Pelvis 08/21/2020. FINDINGS: Lower chest: Cardiac size at the upper limits of normal. Lung bases are negative aside from minor atelectasis. Hepatobiliary: Stable liver size and enhancement. Small hypodense area in the right lobe on series 2, image 23 is 5 mm, stable and too small to characterize. Negative gallbladder. No bile duct enlargement. No periportal edema is evident. Pancreas: Negative. Spleen: Stable splenomegaly, estimated splenic volume 692 mL. (normal splenic volume range 83 - 412 mL). No discrete splenic lesion. Adrenals/Urinary Tract: Negative adrenal glands. Symmetric renal enhancement and contrast excretion. Kidneys appears stable and within normal limits, tiny right renal midpole cysts suspected. Negative ureters. Stable and negative bladder. Stomach/Bowel: Decompressed and negative rectum. Redundant but otherwise negative sigmoid colon. Mild retained stool in the descending colon. Largely decompressed transverse and right colon. Normal appendix on series 2, image 66. No large bowel inflammation. Fluid-filled but normal terminal ileum. No dilated small bowel. Stomach is stable and negative. Mesenteric stranding along the 2nd portion of the duodenum has regressed since last month with mild residual on series 2, image 33. Otherwise negative duodenum. No free air, free fluid, new mesenteric inflammation. Vascular/Lymphatic: Major vascular structures appear patent without atherosclerosis, including patent portal venous system. Prominent portacaval lymph node measuring 16 mm short axis is stable and likely hepatitis related in this setting. No other lymphadenopathy.  Reproductive: Negative. Other: No pelvic free fluid. Musculoskeletal: Partially visible healing left lateral rib fractures described last month. Subacute appearing left lumbar transverse process fractures again noted. No new osseous abnormality identified. IMPRESSION: 1. Largely stable CT appearance of the abdomen and pelvis from last month. No new abnormality identified. 2. Splenomegaly with suspected chronic liver disease, and likely hepatitis related mild portacaval lymphadenopathy. 3. Resolving possibly posttraumatic mesenteric inflammation adjacent to the duodenum. Subacute left rib and lumbar transverse process fractures. Electronically Signed   By: HGenevie AnnM.D.   On: 09/06/2020 05:13    EKG:   Independently reviewed.  Orders placed or performed during the hospital encounter of 09/06/20  . EKG 12-Lead  . EKG 12-Lead  . EKG 12-Lead  . EKG 12-Lead  . EKG 12-Lead   ---------------------------------------------------------------------------------------------------------------------------------------    Assessment / Plan:   Principal Problem:   Intractable nausea and vomiting Active Problems:   Hepatitis  Closed rib fracture   DDD (degenerative disc disease), lumbar   Essential hypertension, benign   GERD (gastroesophageal reflux disease)   Secondary syphilis in male   Hyperbilirubinemia   Jaundice   Principal Problem:    Intractable nausea and vomiting-abdominal pain -Recent history of hepatitis B infection, transaminitis, hyperbilirubinemia -GI was contacted and discussed with ED staff-GI does not feel that this is related to his hepatitis B -CT abdomen reviewed no acute significant findings -We will continue with IV fluid hydration, as needed antiemetics -We will rule out viral versus bacterial infection  Recent history of hepatitis B/with transaminitis, hyperbilirubinemia-jaundice -These are improving--- -alk phos 221, 233, 234, Albumin 2.8, 3.0, 3.4, Lipase  70 AST 445, 335, 204, ALT 507, 417, 240, Total bilirubin 35.5, 36.1, 38.0, -Failed IV fluid hydration, monitor LFTs as above  Active Problems:  Recent history of fall, with sustaining closed rib fracture -continue as needed analgesics   DDD (degenerative disc disease), lumbar -stable continue as needed analgesics   Essential hypertension, benign -BP stable, not on any home medication, as needed hydralazine   GERD (gastroesophageal reflux disease) -resuming PPI   Secondary syphilis in male -posttreatment, no active lesions   Jaundice -due to hyperbilirubinemia, will continue to monitor closely no signs of encephalopathy    Cultures:  -none  Antimicrobial: -none   Consults called:  GI -------------------------------------------------------------------------------------------------------------------------------------------- DVT prophylaxis: SCD/Compression stockings and Heparin SQ Code Status:   Code Status: Full Code   Admission status: Patient will be admitted as Inpatient, with a greater than 2 midnight length of stay. Level of care: Med-Surg   Family Communication:  none at bedside  (The above findings and plan of care has been discussed with patient in detail, the patient expressed understanding and agreement of above plan)  --------------------------------------------------------------------------------------------------------------------------------------------------  Disposition Plan:  Anticipated 1-2 days Status is: Inpatient  Remains inpatient appropriate because:Inpatient level of care appropriate due to severity of illness   Dispo: The patient is from: Home              Anticipated d/c is to: Home              Patient currently is not medically stable to d/c.   Difficult to place patient No         ----------------------------------------------------------------------------------------------------------------------------------------------------  Time  spent: > than  55  Min.   SIGNED: Deatra James, MD, FHM. [facility],  Pager (Please use [email or web address] to page to text)  If 7PM-7AM, please contact night-coverage [email or web address],  09/06/2020, 8:12 AM

## 2020-09-06 NOTE — ED Notes (Signed)
CRITICAL VALUE STICKER  CRITICAL VALUE: TBILI [id number] MD NOTIFIED: Dr. Manus Gunning TIME OF NOTIFICATION: [phone]

## 2020-09-06 NOTE — ED Notes (Signed)
Pt vomiting large amounts of lime green liquid emesis into toilet. Dr. Manus Gunning notified.

## 2020-09-06 NOTE — TOC Initial Note (Signed)
Transition of Care [facility]) - Initial/Assessment Note    Patient Details  Name: Brandon Lane MRN: [id number] Date of Birth: Jan 26, 1966  [facility]) CM/SW Contact:    Annice Needy, LCSW Phone Number: 09/06/2020, 5:22 PM  Clinical Narrative:                 [facility] consulted for PCP needs/HH/DME needs. Patient has PCP and is seen by [facility] in [place]. States that he goes "whenever I need to" and pays out of pocket. Asked of TOC could help with medical bills. TOC discussed applying for Medicaid. Patient states that he was denied Medicaid. TOC offered to set up with health department which would be more cost effective and patient declined stating that he prefers to stay with his current PCP provider.  TOC will follow through d/c.  Expected Discharge Plan: Home/Self Care Barriers to Discharge: Continued Medical Work up   Patient Goals and CMS Choice        Expected Discharge Plan and Services Expected Discharge Plan: Home/Self Care       Living arrangements for the past 2 months: Single Family Home                                      Prior Living Arrangements/Services Living arrangements for the past 2 months: Single Family Home   Patient language and need for interpreter reviewed:: Yes        Need for Family Participation in Patient Care: Yes (Comment) Care giver support system in place?: Yes (comment)   Criminal Activity/Legal Involvement Pertinent to Current Situation/Hospitalization: No - Comment as needed  Activities of Daily Living Home Assistive Devices/Equipment: None ADL Screening (condition at time of admission) Patient's cognitive ability adequate to safely complete daily activities?: Yes Is the patient deaf or have difficulty hearing?: No Does the patient have difficulty seeing, even when wearing glasses/contacts?: No Does the patient have difficulty concentrating, remembering, or making decisions?: No Patient able to express  need for assistance with ADLs?: Yes Does the patient have difficulty dressing or bathing?: No Independently performs ADLs?: Yes (appropriate for developmental age) Does the patient have difficulty walking or climbing stairs?: No Weakness of Legs: None Weakness of Arms/Hands: None  Permission Sought/Granted                  Emotional Assessment     Affect (typically observed): Appropriate Orientation: : Oriented to Place,Oriented to Self,Oriented to  Time,Oriented to Situation Alcohol / Substance Use: Not Applicable    Admission diagnosis:  Hyperbilirubinemia [E80.6] Jaundice [R17] Intractable nausea and vomiting [R11.2] Patient Active Problem List   Diagnosis Date Noted  . Intractable nausea and vomiting 09/06/2020  . Acute hepatitis B virus infection   . Jaundice   . Hyperbilirubinemia 08/21/2020  . Hepatitis 08/21/2020  . Closed rib fracture 08/21/2020  . 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 Penile lesion 05/19/2020  . Rash 05/19/2020  . Hyperglycemia 05/19/2020  . Deficiency anemia 05/19/2020  . Mass of perianal area 05/12/2020  . Vitamin D deficiency disease 08/23/2018  . Class 1 obesity due to excess calories with serious comorbidity and body mass index (BMI) of 33.0 to 33.9 in adult 08/22/2018  . Major psychotic depression, recurrent (HCC) 08/22/2018  . GERD (gastroesophageal reflux disease) 02/22/2015  . Eczema, allergic 02/22/2015  . Allergic rhinitis due to pollen 02/22/2015  . Spinal stenosis of lumbar  region 02/02/2014  . Essential hypertension, benign 01/03/2013  . OSA (obstructive sleep apnea) 10/25/2012  . DDD (degenerative disc disease), lumbar 12/16/2010  . ED (erectile dysfunction) of non-organic origin 10/24/2010   PCP:  Etta Grandchild, MD Pharmacy:   [facility] [place], [place] - [address] [place] [address] Phone: [phone] Fax: [phone]     Social Determinants of Health (SDOH)  Interventions    Readmission Risk Interventions No flowsheet data found.

## 2020-09-06 NOTE — ED Triage Notes (Signed)
Pt c/o vomiting since about 4am yesterday morning. Pt unable to keep anything down. Pt recently admitted and dx with Hep B and syphilis.

## 2020-09-06 NOTE — ED Provider Notes (Signed)
[facility] EMERGENCY DEPARTMENT Provider Note   CSN: [id number] Arrival date & time: 09/06/20  0001     History Chief Complaint  Patient presents with  . Emesis    Brandon Lane is a 55 y.o. male.  Patient with recent admission for jaundice and found to have acute hepatitis B infection.  He is here with intractable nausea and vomiting since about 4 AM yesterday.  States that started after eating some pizza.  He not been able to keep anything down and has lost count of the number of times he has vomited.  Vomit has been nonbloody and nonbilious.  Denies diarrhea or fever.  Has some right lower quadrant and periumbilical abdominal pain which is new. Denies any recent travel or sick contacts. He is not certain what is being done about his hepatitis B.  Has not seen a gastroenterologist since he left the hospital.  The history is provided by the patient.  Emesis Associated symptoms: abdominal pain   Associated symptoms: no arthralgias, no fever, no headaches and no myalgias        Past Medical History:  Diagnosis Date  . DDD (degenerative disc disease), lumbar   . Depression   . GERD (gastroesophageal reflux disease)   . Hepatitis B   . Syphilis     Patient Active Problem List   Diagnosis Date Noted  . Acute hepatitis B virus infection   . Jaundice   . Hyperbilirubinemia 08/21/2020  . Hepatitis 08/21/2020  . Closed rib fracture 08/21/2020  . 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 Encounter for general adult medical examination with abnormal findings 05/19/2020  . Penile lesion 05/19/2020  . Rash 05/19/2020  . Hyperglycemia 05/19/2020  . Deficiency anemia 05/19/2020  . Mass of perianal area 05/12/2020  . Vitamin D deficiency disease 08/23/2018  . Class 1 obesity due to excess calories with serious comorbidity and body mass index (BMI) of 33.0 to 33.9 in adult 08/22/2018  . Major psychotic depression, recurrent (HCC) 08/22/2018  . GERD (gastroesophageal reflux  disease) 02/22/2015  . Eczema, allergic 02/22/2015  . Allergic rhinitis due to pollen 02/22/2015  . Spinal stenosis of lumbar region 02/02/2014  . Essential hypertension, benign 01/03/2013  . OSA (obstructive sleep apnea) 10/25/2012  . DDD (degenerative disc disease), lumbar 12/16/2010  . ED (erectile dysfunction) of non-organic origin 10/24/2010    History reviewed. No pertinent surgical history.     Family History  Problem Relation Age of Onset  . Arthritis Mother   . Hyperlipidemia Mother   . Stroke Mother   . Hypertension Mother   . Kidney disease Mother   . Diabetes Mother   . Alcohol abuse Father   . Hypertension Sister   . Cancer Neg Hx   . Early death Neg Hx   . Heart disease Brother   . Hypertension Brother   . Stroke Brother     Social History   Tobacco Use  . Smoking status: Former Smoker    Packs/day: 1.00    Years: 20.00    Pack years: 20.00    Types: Cigarettes    Quit date: 09/21/1998    Years since quitting: 21.9  . Smokeless tobacco: Never Used  Substance Use Topics  . Alcohol use: Yes    Alcohol/week: 10.0 standard drinks    Types: 10 Cans of beer per week  . Drug use: No    Home Medications Prior to Admission medications   Medication Sig Start Date End Date Taking? Authorizing Provider  cholestyramine (QUESTRAN) 4 g packet Take one packet daily for itching. Do not take within two hours of other medications. 09/02/20   Tiffany Kocher, PA-C  oxyCODONE (OXY IR/ROXICODONE) 5 MG immediate release tablet Take 1 tablet (5 mg total) by mouth every 4 (four) hours as needed for moderate pain. 08/26/20   Catarina Hartshorn, MD  venlafaxine XR (EFFEXOR-XR) 150 MG 24 hr capsule TAKE 1 CAPSULE BY MOUTH DAILY WITH BREAKFAST. Patient taking differently: Take 150 mg by mouth daily with breakfast. 04/08/20   Etta Grandchild, MD    Allergies    Patient has no known allergies.  Review of Systems   Review of Systems  Constitutional: Positive for activity change,  appetite change and fatigue. Negative for fever.  HENT: Negative for congestion and rhinorrhea.   Gastrointestinal: Positive for abdominal pain, nausea and vomiting.  Genitourinary: Negative for dysuria and hematuria.  Musculoskeletal: Negative for arthralgias and myalgias.  Skin: Negative for rash.  Neurological: Negative for dizziness and headaches.   all other systems are negative except as noted in the HPI and PMH.    Physical Exam Updated Vital Signs BP (!) 146/87   Pulse 75   Temp (!) 97.5 F (36.4 C)   Resp 20   SpO2 100%   Physical Exam Vitals and nursing note reviewed.  Constitutional:      General: He is not in acute distress.    Appearance: He is well-developed. He is ill-appearing.     Comments: Ill-appearing, jaundiced, dry mucous membranes  HENT:     Head: Normocephalic and atraumatic.     Mouth/Throat:     Pharynx: No oropharyngeal exudate.  Eyes:     Conjunctiva/sclera: Conjunctivae normal.     Pupils: Pupils are equal, round, and reactive to light.  Neck:     Comments: No meningismus. Cardiovascular:     Rate and Rhythm: Normal rate and regular rhythm.     Heart sounds: Normal heart sounds. No murmur heard.   Pulmonary:     Effort: Pulmonary effort is normal. No respiratory distress.     Breath sounds: Normal breath sounds.  Abdominal:     Palpations: Abdomen is soft.     Tenderness: There is abdominal tenderness. There is guarding. There is no rebound.     Comments: Periumbilical and right lower quadrant tender  Musculoskeletal:        General: No tenderness. Normal range of motion.     Cervical back: Normal range of motion and neck supple.  Skin:    General: Skin is warm.  Neurological:     Mental Status: He is alert and oriented to person, place, and time.     Cranial Nerves: No cranial nerve deficit.     Motor: No abnormal muscle tone.     Coordination: Coordination normal.     Comments: No ataxia on finger to nose bilaterally. No pronator  drift. 5/5 strength throughout. CN 2-12 intact.Equal grip strength. Sensation intact.   Psychiatric:        Behavior: Behavior normal.     ED Results / Procedures / Treatments   Labs (all labs ordered are listed, but only abnormal results are displayed) Labs Reviewed  URINALYSIS, ROUTINE W REFLEX MICROSCOPIC - Abnormal; Notable for the following components:      Result Value   Color, Urine AMBER (*)    APPearance HAZY (*)    Glucose, UA 50 (*)    Bilirubin Urine MODERATE (*)    Ketones, ur 5 (*)  Protein, ur 30 (*)    Bacteria, UA RARE (*)    Non Squamous Epithelial 0-5 (*)    All other components within normal limits  COMPREHENSIVE METABOLIC PANEL - Abnormal; Notable for the following components:   Glucose, Bld 126 (*)    Creatinine, Ser 0.30 (*)    Total Protein 9.1 (*)    Albumin 3.4 (*)    AST 204 (*)    ALT 240 (*)    Alkaline Phosphatase 234 (*)    Total Bilirubin 38.0 (*)    All other components within normal limits  LIPASE, BLOOD - Abnormal; Notable for the following components:   Lipase 70 (*)    All other components within normal limits  CBC WITH DIFFERENTIAL/PLATELET - Abnormal; Notable for the following components:   WBC 10.7 (*)    Hemoglobin 12.8 (*)    Neutro Abs 8.0 (*)    All other components within normal limits  SARS CORONAVIRUS 2 (TAT 6-24 HRS)  PROTIME-INR  AMMONIA  CBC WITH DIFFERENTIAL/PLATELET  TROPONIN I (HIGH SENSITIVITY)    EKG EKG Interpretation  Date/Time:  Monday September 06 2020 02:45:42 EDT Ventricular Rate:  87 PR Interval:  136 QRS Duration: 93 QT Interval:  380 QTC Calculation: 458 R Axis:   48 Text Interpretation: Sinus rhythm Borderline T wave abnormalities No significant change was found Confirmed by Glynn Octave [phone]) on 09/06/2020 3:27:03 AM   Radiology CT ABDOMEN PELVIS W CONTRAST  Result Date: 09/06/2020 CLINICAL DATA:  55 year old male with right abdominal pain and jaundice. Vomiting. Recent diagnosis of  hepatitis-B. EXAM: CT ABDOMEN AND PELVIS WITH CONTRAST TECHNIQUE: Multidetector CT imaging of the abdomen and pelvis was performed using the standard protocol following bolus administration of intravenous contrast. CONTRAST:  OMNIPAQUE IOHEXOL 300 MG/ML  SOLN COMPARISON:  CT Chest, Abdomen, and Pelvis 08/21/2020. FINDINGS: Lower chest: Cardiac size at the upper limits of normal. Lung bases are negative aside from minor atelectasis. Hepatobiliary: Stable liver size and enhancement. Small hypodense area in the right lobe on series 2, image 23 is 5 mm, stable and too small to characterize. Negative gallbladder. No bile duct enlargement. No periportal edema is evident. Pancreas: Negative. Spleen: Stable splenomegaly, estimated splenic volume 692 mL. (normal splenic volume range 83 - 412 mL). No discrete splenic lesion. Adrenals/Urinary Tract: Negative adrenal glands. Symmetric renal enhancement and contrast excretion. Kidneys appears stable and within normal limits, tiny right renal midpole cysts suspected. Negative ureters. Stable and negative bladder. Stomach/Bowel: Decompressed and negative rectum. Redundant but otherwise negative sigmoid colon. Mild retained stool in the descending colon. Largely decompressed transverse and right colon. Normal appendix on series 2, image 66. No large bowel inflammation. Fluid-filled but normal terminal ileum. No dilated small bowel. Stomach is stable and negative. Mesenteric stranding along the 2nd portion of the duodenum has regressed since last month with mild residual on series 2, image 33. Otherwise negative duodenum. No free air, free fluid, new mesenteric inflammation. Vascular/Lymphatic: Major vascular structures appear patent without atherosclerosis, including patent portal venous system. Prominent portacaval lymph node measuring 16 mm short axis is stable and likely hepatitis related in this setting. No other lymphadenopathy. Reproductive: Negative. Other: No pelvic  free fluid. Musculoskeletal: Partially visible healing left lateral rib fractures described last month. Subacute appearing left lumbar transverse process fractures again noted. No new osseous abnormality identified. IMPRESSION: 1. Largely stable CT appearance of the abdomen and pelvis from last month. No new abnormality identified. 2. Splenomegaly with suspected chronic liver disease, and  likely hepatitis related mild portacaval lymphadenopathy. 3. Resolving possibly posttraumatic mesenteric inflammation adjacent to the duodenum. Subacute left rib and lumbar transverse process fractures. Electronically Signed   By: Odessa FlemingH  Hall M.D.   On: 09/06/2020 05:13    Procedures Procedures   Medications Ordered in ED Medications  sodium chloride 0.9 % bolus 1,000 mL (has no administration in time range)  ondansetron (ZOFRAN) injection 4 mg (has no administration in time range)  morphine 4 MG/ML injection 4 mg (has no administration in time range)    ED Course  I have reviewed the triage vital signs and the nursing notes.  Pertinent labs & imaging results that were available during my care of the patient were reviewed by me and considered in my medical decision making (see chart for details).    MDM Rules/Calculators/A&P                         Patient with known hepatitis B infection here with intractable nausea and vomiting since 4 AM.  New lower abdominal pain  Patient given IV hydration and symptom control.  Labs show stable transaminitis. Bilirubin is up to 38.  Patient continues to have abdominal pain and nausea and vomiting.  Is given additional antiemetics.  CT scan shows stable appearance compared to last month with no bowel obstruction  D/w Dr. Levon Hedgerastaneda gastroenterology who knows patient.  He does not feel that acute hepatitis B infection could be causing his vomiting and abdominal pain. His labs appear to be relatively stable and stable CT scan.  Patient still with nausea and vomiting  and was not successful with fluid challenge. States abdominal pain has improved.  His labs and CT scan appear stable.  Plan observation admission for intractable nausea and vomiting in setting of recently diagnosed acute Hepatitis B infection.  D/w Dr. Carren RangZierle-Ghosh. Final Clinical Impression(s) / ED Diagnoses Final diagnoses:  None    Rx / DC Orders ED Discharge Orders    None       Dellanira Dillow, Jeannett SeniorStephen, MD 09/06/20 [phone]

## 2020-09-06 NOTE — Evaluation (Signed)
Physical Therapy Evaluation Patient Details Name: Brandon Lane MRN: [id number] DOB: [birth year] Today's Date: 09/06/2020   History of Present Illness  Brandon Lane is a 55 y.o. 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   Presenting to the ED with intractable nausea vomiting since yesterday.  Last meal was yesterday unable to tolerate p.o. now.  Vomitus has been nonbloody, nonbilious.  Denied diarrhea.  Complaining of right lower quadrant, periumbilical abdominal discomfort   Clinical Impression   Patient exhibits baseline performance consistent with his prior level of function and does not present with any deficits or limitations in regards to his functional mobility and is able to safely complete bed mobility, transfers, ambulation, and stair negotiation in an independent fashion with low risk for falls.  No further PT services indicated at this time and pt encouraged to perform daily ambulation on the unit ad lib.    Follow Up Recommendations      Equipment Recommendations       Recommendations for Other Services       Precautions / Restrictions Restrictions Weight Bearing Restrictions: No      Mobility  Bed Mobility Overal bed mobility: Independent                  Transfers Overall transfer level: Independent                  Ambulation/Gait Ambulation/Gait assistance: Independent Gait Distance (Feet): 300 Feet Assistive device: None Gait Pattern/deviations: WFL(Within Functional Limits) Gait velocity: WNL      Stairs            Wheelchair Mobility    Modified Rankin (Stroke Patients Only)       Balance Overall balance assessment: Independent                                           Pertinent  Vitals/Pain Pain Assessment: No/denies pain    Home Living Family/patient expects to be discharged to:: Private residence Living Arrangements: Spouse/significant other Available Help at Discharge: Family Type of Home: House                Prior Function Level of Independence: Independent               Hand Dominance        Extremity/Trunk Assessment   Upper Extremity Assessment Upper Extremity Assessment: Overall WFL for tasks assessed    Lower Extremity Assessment Lower Extremity Assessment: Overall WFL for tasks assessed    Cervical / Trunk Assessment Cervical / Trunk Assessment: Normal  Communication   Communication: No difficulties  Cognition Arousal/Alertness: Awake/alert Behavior During Therapy: WFL for tasks assessed/performed Overall Cognitive Status: Within Functional Limits for tasks assessed                                        General Comments General comments (skin integrity, edema, etc.): jaundice    Exercises     Assessment/Plan    PT Assessment Patent does not need any further PT services  PT Problem List         PT Treatment Interventions  PT Goals (Current goals can be found in the Care Plan section)  Acute Rehab PT Goals Patient Stated Goal: Return home    Frequency     Barriers to discharge        Co-evaluation               AM-PAC PT "6 Clicks" Mobility  Outcome Measure Help needed turning from your back to your side while in a flat bed without using bedrails?: None Help needed moving from lying on your back to sitting on the side of a flat bed without using bedrails?: None Help needed moving to and from a bed to a chair (including a wheelchair)?: None Help needed standing up from a chair using your arms (e.g., wheelchair or bedside chair)?: None Help needed to walk in hospital room?: None Help needed climbing 3-5 steps with a railing? : None 6 Click Score: 24    End of Session    Activity Tolerance: Patient tolerated treatment well Patient left: with nursing/sitter in room Nurse Communication: Mobility status (recommend daily ambulation which he is capable of completing safely on his own throughout the unit) PT Visit Diagnosis: History of falling (Z91.81)    Time: 1600-1630 PT Time Calculation (min) (ACUTE ONLY): 30 min   Charges:   PT Evaluation $PT Eval Moderate Complexity: 1 Mod PT Treatments $Gait Training: 8-22 mins       4:39 PM, 09/06/20 M. Shary Decamp, PT, DPT Physical Therapist- [place] Office Number: [phone]

## 2020-09-06 NOTE — Consult Note (Signed)
Referring Provider: Dr. Glynn OctaveStephen Rancour Primary Care Physician:  Etta GrandchildJones, Thomas L, MD Primary Gastroenterologist:  Dr. Jena Gaussourk  Date of Admission: 09/06/20 Date of Consultation: 09/06/20  Reason for Consultation:  N/V, history of acute Hep B  HPI:  Larey DresserMartin Bolar is a 55 y.o. year old male with history of secondary syphilis, recently inpatient mid March 2022 with painless jaundice and found to have acute Hepatitis B. Hep C antibody and RNA negative, HIV negative. Mildly lobular hepatic surface contour on imaging in March 2022. Splenomegaly noted on CT imaging today. Post-traumatic mesenteric inflammation s/p fall several weeks ago resolving. He has been followed closely as outpatient with serial labs. Although Tbili continued to rise, his INR remains normal and transaminases improving. Currently following supportively for Hep B.   Presented to the ED with acute nausea and vomiting, unable to tolerate liquids at home and had no relief. He notes that he ate 4 pieces of [employer] stuffed crust pizza prior to onset of acute N/V. No abdominal pain while vomiting, but he does note some mild discomfort lower abdomen after heaving, which has now improved. At time of consultation, he has tolerated clear liquids, with resolution of N/V. Denies any mental status changes, confusion, hematemesis, melena, hematochezia. Rare GERD at home and does not take PPI. No dysphagia.   Past Medical History:  Diagnosis Date  . DDD (degenerative disc disease), lumbar   . Depression   . GERD (gastroesophageal reflux disease)   . Hepatitis B   . Syphilis     History reviewed. No pertinent surgical history.  Prior to Admission medications   Medication Sig Start Date End Date Taking? Authorizing Provider  venlafaxine XR (EFFEXOR-XR) 150 MG 24 hr capsule TAKE 1 CAPSULE BY MOUTH DAILY WITH BREAKFAST. Patient taking differently: Take 150 mg by mouth daily with breakfast. 04/08/20  Yes Etta GrandchildJones, Thomas L, MD  cholestyramine  Lanetta Inch(QUESTRAN) 4 g packet Take one packet daily for itching. Do not take within two hours of other medications. Patient not taking: Reported on 09/06/2020 09/02/20   Tiffany KocherLewis, Leslie S, PA-C  oxyCODONE (OXY IR/ROXICODONE) 5 MG immediate release tablet Take 1 tablet (5 mg total) by mouth every 4 (four) hours as needed for moderate pain. Patient not taking: No sig reported 08/26/20   Catarina Hartshornat, David, MD    Current Facility-Administered Medications  Medication Dose Route Frequency Provider Last Rate Last Admin  . 0.9 %  sodium chloride infusion   Intravenous Continuous Nevin BloodgoodShahmehdi, Seyed A, MD 150 mL/hr at 09/06/20 0845 New Bag at 09/06/20 0845  . 0.9 %  sodium chloride infusion  250 mL Intravenous PRN Shahmehdi, Seyed A, MD      . bisacodyl (DULCOLAX) EC tablet 5 mg  5 mg Oral Daily PRN Shahmehdi, Seyed A, MD      . cholestyramine (QUESTRAN) packet 4 g  4 g Oral TID Shahmehdi, Seyed A, MD      . heparin injection 5,000 Units  5,000 Units Subcutaneous Q8H Shahmehdi, Seyed A, MD      . hydrALAZINE (APRESOLINE) injection 10 mg  10 mg Intravenous Q4H PRN Shahmehdi, Seyed A, MD      . HYDROmorphone (DILAUDID) injection 0.5-1 mg  0.5-1 mg Intravenous Q2H PRN Shahmehdi, Seyed A, MD      . ibuprofen (ADVIL) tablet 600 mg  600 mg Oral Q6H PRN Shahmehdi, Seyed A, MD      . ipratropium (ATROVENT) nebulizer solution 0.5 mg  0.5 mg Nebulization Q6H PRN Kendell BaneShahmehdi, Seyed A, MD      .  levalbuterol (XOPENEX) nebulizer solution 0.63 mg  0.63 mg Nebulization Q6H PRN Shahmehdi, Seyed A, MD      . magnesium citrate solution 1 Bottle  1 Bottle Oral Once PRN Shahmehdi, Seyed A, MD      . multivitamin with minerals tablet 1 tablet  1 tablet Oral Daily Shahmehdi, Seyed A, MD      . ondansetron (ZOFRAN) tablet 4 mg  4 mg Oral Q6H PRN Shahmehdi, Seyed A, MD       Or  . ondansetron (ZOFRAN) injection 4 mg  4 mg Intravenous Q6H PRN Shahmehdi, Seyed A, MD      . oxyCODONE (Oxy IR/ROXICODONE) immediate release tablet 5 mg  5 mg Oral Q4H PRN  Shahmehdi, Seyed A, MD      . senna-docusate (Senokot-S) tablet 1 tablet  1 tablet Oral QHS PRN Shahmehdi, Seyed A, MD      . sodium chloride flush (NS) 0.9 % injection 3 mL  3 mL Intravenous Q12H Shahmehdi, Seyed A, MD      . sodium chloride flush (NS) 0.9 % injection 3 mL  3 mL Intravenous Q12H Shahmehdi, Seyed A, MD      . sodium chloride flush (NS) 0.9 % injection 3 mL  3 mL Intravenous PRN Shahmehdi, Seyed A, MD      . venlafaxine XR (EFFEXOR-XR) 24 hr capsule 150 mg  150 mg Oral Q breakfast Shahmehdi, Seyed A, MD   150 mg at 09/06/20 0848   Current Outpatient Medications  Medication Sig Dispense Refill  . venlafaxine XR (EFFEXOR-XR) 150 MG 24 hr capsule TAKE 1 CAPSULE BY MOUTH DAILY WITH BREAKFAST. (Patient taking differently: Take 150 mg by mouth daily with breakfast.) 90 capsule 0  . cholestyramine (QUESTRAN) 4 g packet Take one packet daily for itching. Do not take within two hours of other medications. (Patient not taking: Reported on 09/06/2020) 30 each 1  . oxyCODONE (OXY IR/ROXICODONE) 5 MG immediate release tablet Take 1 tablet (5 mg total) by mouth every 4 (four) hours as needed for moderate pain. (Patient not taking: No sig reported) 15 tablet 0    Allergies as of 09/06/2020  . (No Known Allergies)    Family History  Problem Relation Age of Onset  . Arthritis Mother   . Hyperlipidemia Mother   . Stroke Mother   . Hypertension Mother   . Kidney disease Mother   . Diabetes Mother   . Alcohol abuse Father   . Hypertension Sister   . Cancer Neg Hx   . Early death Neg Hx   . Heart disease Brother   . Hypertension Brother   . Stroke Brother     Social History   Socioeconomic History  . Marital status: Married    Spouse name: Not on file  . Number of children: 5  . Years of education: 10  . Highest education level: Not on file  Occupational History    Employer: [employer]  Tobacco Use  . Smoking status: Former Smoker    Packs/day: 1.00    Years: 20.00    Pack  years: 20.00    Types: Cigarettes    Quit date: 09/21/1998    Years since quitting: 21.9  . Smokeless tobacco: Never Used  Substance and Sexual Activity  . Alcohol use: Yes    Alcohol/week: 10.0 standard drinks    Types: 10 Cans of beer per week  . Drug use: No  . Sexual activity: Yes  Other Topics Concern  . Not on  file  Social History Narrative   Caffienated beverages- Yes   Seat belts use-Yes   Smoke Alarm in home-yes   Guns / fireman- No   Physical abuse- No   Social Determinants of Corporate investment banker Strain: Not on file  Food Insecurity: Not on file  Transportation Needs: Not on file  Physical Activity: Not on file  Stress: Not on file  Social Connections: Not on file  Intimate Partner Violence: Not on file    Review of Systems: Gen: Denies fever, chills, loss of appetite, change in weight or weight loss CV: Denies chest pain, heart palpitations, syncope, edema  Resp: Denies shortness of breath with rest, cough, wheezing GI: see HPI GU : Denies urinary burning, urinary frequency, urinary incontinence.  MS: Denies joint pain,swelling, cramping Derm: Denies rash, itching, dry skin Psych: Denies depression, anxiety,confusion, or memory loss Heme: Denies bruising, bleeding, and enlarged lymph nodes.  Physical Exam: Vital signs in last 24 hours: Temp:  [97.5 F (36.4 C)] 97.5 F (36.4 C) (04/04 0045) Pulse Rate:  [61-81] 78 (04/04 0620) Resp:  [14-20] 14 (04/04 0620) BP: (122-146)/(69-87) 128/74 (04/04 0620) SpO2:  [97 %-100 %] 100 % (04/04 0620)   General:   Alert,  Well-developed, well-nourished, markedly jaundiced Head:  Normocephalic and atraumatic. Eyes:  +scleral icterus Ears:  Normal auditory acuity. Nose:  No deformity, discharge,  or lesions. Mouth:  No deformity or lesions, dentition normal. Lungs:  Clear throughout to auscultation.  Heart:  S1 S2 present without murmurs Abdomen:  Soft, nontender and nondistended. No masses,  hepatosplenomegaly or hernias noted. Normal bowel sounds, without guarding, and without rebound.   Rectal:  Deferred  Msk:  Symmetrical without gross deformities. Normal posture. Extremities:  Without edema. Neurologic:  Alert and  oriented x4 Psych:  Alert and cooperative. Normal mood and affect.  Intake/Output from previous day: No intake/output data recorded. Intake/Output this shift: No intake/output data recorded.  Lab Results: Recent Labs    09/06/20 0243  WBC 10.7*  HGB 12.8*  HCT RESULTS UNAVAILABLE DUE TO INTERFERING SUBSTANCE  PLT 366   BMET Recent Labs    09/06/20 0242  NA 135  K 3.5  CL 98  CO2 23  GLUCOSE 126*  BUN 11  CREATININE 0.30*  CALCIUM 9.7   LFT Recent Labs    09/03/20 1314 09/06/20 0242  PROT 8.1 9.1*  ALBUMIN 3.0* 3.4*  AST 335* 204*  ALT 417* 240*  ALKPHOS 233* 234*  BILITOT 36.1* 38.0*  BILIDIR >30.0*  --   IBILI NOT CALCULATED  --    PT/INR Recent Labs    09/03/20 1314 09/06/20 0242  LABPROT 12.9 13.3  INR 1.0 1.1    Studies/Results: CT ABDOMEN PELVIS W CONTRAST  Result Date: 09/06/2020 CLINICAL DATA:  55 year old male with right abdominal pain and jaundice. Vomiting. Recent diagnosis of hepatitis-B. EXAM: CT ABDOMEN AND PELVIS WITH CONTRAST TECHNIQUE: Multidetector CT imaging of the abdomen and pelvis was performed using the standard protocol following bolus administration of intravenous contrast. CONTRAST:  OMNIPAQUE IOHEXOL 300 MG/ML  SOLN COMPARISON:  CT Chest, Abdomen, and Pelvis 08/21/2020. FINDINGS: Lower chest: Cardiac size at the upper limits of normal. Lung bases are negative aside from minor atelectasis. Hepatobiliary: Stable liver size and enhancement. Small hypodense area in the right lobe on series 2, image 23 is 5 mm, stable and too small to characterize. Negative gallbladder. No bile duct enlargement. No periportal edema is evident. Pancreas: Negative. Spleen: Stable splenomegaly, estimated splenic  volume 692  mL. (normal splenic volume range 83 - 412 mL). No discrete splenic lesion. Adrenals/Urinary Tract: Negative adrenal glands. Symmetric renal enhancement and contrast excretion. Kidneys appears stable and within normal limits, tiny right renal midpole cysts suspected. Negative ureters. Stable and negative bladder. Stomach/Bowel: Decompressed and negative rectum. Redundant but otherwise negative sigmoid colon. Mild retained stool in the descending colon. Largely decompressed transverse and right colon. Normal appendix on series 2, image 66. No large bowel inflammation. Fluid-filled but normal terminal ileum. No dilated small bowel. Stomach is stable and negative. Mesenteric stranding along the 2nd portion of the duodenum has regressed since last month with mild residual on series 2, image 33. Otherwise negative duodenum. No free air, free fluid, new mesenteric inflammation. Vascular/Lymphatic: Major vascular structures appear patent without atherosclerosis, including patent portal venous system. Prominent portacaval lymph node measuring 16 mm short axis is stable and likely hepatitis related in this setting. No other lymphadenopathy. Reproductive: Negative. Other: No pelvic free fluid. Musculoskeletal: Partially visible healing left lateral rib fractures described last month. Subacute appearing left lumbar transverse process fractures again noted. No new osseous abnormality identified. IMPRESSION: 1. Largely stable CT appearance of the abdomen and pelvis from last month. No new abnormality identified. 2. Splenomegaly with suspected chronic liver disease, and likely hepatitis related mild portacaval lymphadenopathy. 3. Resolving possibly posttraumatic mesenteric inflammation adjacent to the duodenum. Subacute left rib and lumbar transverse process fractures. Electronically Signed   By: Odessa Fleming M.D.   On: 09/06/2020 05:13    Impression:  Traevon Meiring is a 55 y.o. year old male with history of secondary syphilis,  recently inpatient mid March 2022 with painless jaundice and found to have acute Hepatitis B. Although Tbili has continued to rise, transaminases have been slowly improving post-hospitalization and are followed closely as outpatient. INR remains normal; he has no signs of hepatic encephalopathy.  Now admitted for observation due to acute nausea and vomiting, with onset after eating 4 pieces of [employer] stuffed crust pizza. At time of consultation, he has had resolution of symptoms and tolerating clear liquids. Anticipate he will be able to go home within 24 hours, with close outpatient following as previously planned.  Will continue to monitor serial labs as outpatient. Care remains largely supportive at this time. Tbili has lag effect and take longer to start trending down.    Plan: Regular diet HFP and INR in am Hopeful discharge in next 24 hours Will continue to follow as outpatient Keep outpatient appt in May 2022 with [facility]  Gelene Mink, PhD, ANP-BC [facility]     LOS: 0 days    09/06/2020, 9:58 AM

## 2020-09-07 DIAGNOSIS — R17 Unspecified jaundice: Secondary | ICD-10-CM

## 2020-09-07 DIAGNOSIS — R1110 Vomiting, unspecified: Secondary | ICD-10-CM

## 2020-09-07 DIAGNOSIS — K759 Inflammatory liver disease, unspecified: Secondary | ICD-10-CM

## 2020-09-07 LAB — COMPREHENSIVE METABOLIC PANEL
ALT: 168 U/L — ABNORMAL HIGH (ref 0–44)
AST: 162 U/L — ABNORMAL HIGH (ref 15–41)
Albumin: 2.8 g/dL — ABNORMAL LOW (ref 3.5–5.0)
Alkaline Phosphatase: 172 U/L — ABNORMAL HIGH (ref 38–126)
Anion gap: 10 (ref 5–15)
BUN: 11 mg/dL (ref 6–20)
CO2: 25 mmol/L (ref 22–32)
Calcium: 9.1 mg/dL (ref 8.9–10.3)
Chloride: 102 mmol/L (ref 98–111)
Creatinine, Ser: 0.39 mg/dL — ABNORMAL LOW (ref 0.61–1.24)
GFR, Estimated: >60 mL/min (ref >60)
Glucose, Bld: 84 mg/dL (ref 70–99)
Potassium: 4 mmol/L (ref 3.5–5.1)
Sodium: 137 mmol/L (ref 135–145)
Total Bilirubin: 28.5 mg/dL (ref 0.3–1.2)
Total Protein: 7.3 g/dL (ref 6.5–8.1)

## 2020-09-07 LAB — CBC
Hemoglobin: 10.2 g/dL — ABNORMAL LOW (ref 13.0–17.0)
Platelets: 326 10*3/uL (ref 150–400)
WBC: 5.3 10*3/uL (ref 4.0–10.5)
nRBC: 0 % (ref 0.0–0.2)

## 2020-09-07 LAB — GLUCOSE, CAPILLARY: Glucose-Capillary: 91 mg/dL (ref 70–99)

## 2020-09-07 LAB — PROTIME-INR
INR: 1 (ref 0.8–1.2)
Prothrombin Time: 13.1 seconds (ref 11.4–15.2)

## 2020-09-07 MED ORDER — FUROSEMIDE 10 MG/ML IJ SOLN
40.0000 mg | Freq: Once | INTRAMUSCULAR | Status: AC
  Administered 2020-09-07: 40 mg via INTRAVENOUS
  Filled 2020-09-07: qty 4

## 2020-09-07 MED ORDER — MAGNESIUM CITRATE PO SOLN
1.0000 | Freq: Once | ORAL | Status: AC
  Administered 2020-09-07: 1 via ORAL
  Filled 2020-09-07: qty 296

## 2020-09-07 MED ORDER — SODIUM CHLORIDE 0.9 % IV SOLN: INTRAVENOUS | Status: DC

## 2020-09-07 NOTE — Evaluation (Signed)
Occupational Therapy Evaluation Patient Details Name: Brandon Lane MRN: [id number] DOB: [birth year] Today's Date: 09/07/2020    History of Present Illness Brandon Lane is a 55 y.o. 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   Presenting to the ED with intractable nausea vomiting since yesterday.  Last meal was yesterday unable to tolerate p.o. now.  Vomitus has been nonbloody, nonbilious.  Denied diarrhea.  Complaining of right lower quadrant, periumbilical abdominal discomfort   Clinical Impression   Pt agreeable to OT evaluation. Pt able to complete bed mobility, functional transfers to sink, and hand washing with independence. Pt reported 4/10 low back pain but this did not limit the pt during evaluation. Pt not recommended for further OT services due to being independent for functional ADL's and transfers.     Follow Up Recommendations  No OT follow up    Equipment Recommendations  None recommended by OT           Precautions / Restrictions Restrictions Weight Bearing Restrictions: No      Mobility Bed Mobility Overal bed mobility: Independent                  Transfers Overall transfer level: Independent                    Balance Overall balance assessment: Independent                                         ADL either performed or assessed with clinical judgement   ADL Overall ADL's : Independent                                             Vision Baseline Vision/History:  (Pt reports he needs glasses but cannot afford them currently.) Patient Visual Report: No change from baseline                  Pertinent Vitals/Pain Pain Assessment: 0-10 Pain Score: 4  Pain Location: low back Pain  Descriptors / Indicators: Sore Pain Intervention(s): Monitored during session     Hand Dominance Right   Extremity/Trunk Assessment Upper Extremity Assessment Upper Extremity Assessment: Overall WFL for tasks assessed   Lower Extremity Assessment Lower Extremity Assessment: Overall WFL for tasks assessed   Cervical / Trunk Assessment Cervical / Trunk Assessment: Normal   Communication Communication Communication: No difficulties   Cognition Arousal/Alertness: Awake/alert Behavior During Therapy: WFL for tasks assessed/performed Overall Cognitive Status: Within Functional Limits for tasks assessed                                     General Comments  jaundice               Home Living Family/patient expects to be discharged to:: Private residence Living Arrangements: Spouse/significant other Available Help at Discharge: Family Type of Home: House Home Access: Level entry     Home Layout: One level     Bathroom Shower/Tub: Chief Strategy Officer: Standard Bathroom Accessibility: Yes How Accessible:  Accessible via walker Home Equipment: Walker - 2 wheels;Grab bars - tub/shower          Prior Functioning/Environment Level of Independence: Independent                               OT Goals(Current goals can be found in the care plan section) Acute Rehab OT Goals Patient Stated Goal: Return home  OT Frequency:      AM-PAC OT "6 Clicks" Daily Activity     Outcome Measure Help from another person eating meals?: None Help from another person taking care of personal grooming?: None Help from another person toileting, which includes using toliet, bedpan, or urinal?: None Help from another person bathing (including washing, rinsing, drying)?: None Help from another person to put on and taking off regular upper body clothing?: None Help from another person to put on and taking off regular lower body clothing?: None 6 Click  Score: 24   End of Session    Activity Tolerance: Patient tolerated treatment well Patient left: with call bell/phone within reach;in bed  OT Visit Diagnosis: Other (comment) (nausea and vomiting)                Time: 2229-7989 OT Time Calculation (min): 5 min Charges:  OT General Charges $OT Visit: 1 Visit OT Evaluation $OT Eval Low Complexity: 1 Low  Cage Gupton OT, MOT   Danie Chandler 09/07/2020, 11:49 AM

## 2020-09-07 NOTE — Progress Notes (Signed)
Subjective: Feels well this morning. Slight nausea prior to breakfast, but no vomiting. Nausea resolved after breakfast. No abdominal pain, change in mental status, or confusion.  Reports his appetite had come back, and he overindulged as he was "starving", then nausea and vomiting started.  Asking if I can discontinue Questran. This was prescribed for itching. IT has helped with itching but states it leaves a bad taste and the powder gets caught in his throat. He would like to discontinue and monitor for now.   He did have a regular bowel movement this morning without bright red blood per rectum or melena.  Denies hematemesis or coffee-ground emesis.  Objective: Vital signs in last 24 hours: Temp:  [97.5 F (36.4 C)-97.6 F (36.4 C)] 97.5 F (36.4 C) (04/05 0730) Pulse Rate:  [44-84] 71 (04/05 0730) Resp:  [10-20] 18 (04/05 0730) BP: (112-138)/(65-90) 128/72 (04/05 0730) SpO2:  [92 %-100 %] 98 % (04/05 0730) Weight:  [97.3 kg-98.1 kg] 98.1 kg (04/05 0500) Last BM Date: 09/06/20 General:   Alert and oriented, pleasant, jaundiced  Head:  Normocephalic and atraumatic. Eyes:  + scleral icterus Abdomen:  Bowel sounds present, soft, non-tender, non-distended. No HSM or hernias noted. No rebound or guarding. No masses appreciated  Msk:  Symmetrical without gross deformities. Normal posture. Extremities:  Without edema. Neurologic:  Alert and  oriented x4;  grossly normal neurologically.  Psych:  Normal mood and affect.  Intake/Output from previous day: 04/04 0701 - 04/05 0700 In: 3497.1 [P.O.:720; I.V.:2777.1] Out: -  Intake/Output this shift: No intake/output data recorded.  Lab Results: Recent Labs    09/06/20 0243 09/07/20 0422  WBC 10.7* 5.3  HGB 12.8* 10.2*  HCT RESULTS UNAVAILABLE DUE TO INTERFERING SUBSTANCE RESULTS UNAVAILABLE DUE TO INTERFERING SUBSTANCE  PLT 366 326   BMET Recent Labs    09/06/20 0242 09/07/20 0422  NA 135 137  K 3.5 4.0  CL 98 102  CO2 23  25  GLUCOSE 126* 84  BUN 11 11  CREATININE 0.30* 0.39*  CALCIUM 9.7 9.1   LFT Recent Labs    09/06/20 0242 09/07/20 0422  PROT 9.1* 7.3  ALBUMIN 3.4* 2.8*  AST 204* 162*  ALT 240* 168*  ALKPHOS 234* 172*  BILITOT 38.0* 28.5*   PT/INR Recent Labs    09/06/20 0242 09/07/20 0422  LABPROT 13.3 13.1  INR 1.1 1.0     Studies/Results: CT ABDOMEN PELVIS W CONTRAST  Result Date: 09/06/2020 CLINICAL DATA:  55 year old male with right abdominal pain and jaundice. Vomiting. Recent diagnosis of hepatitis-B. EXAM: CT ABDOMEN AND PELVIS WITH CONTRAST TECHNIQUE: Multidetector CT imaging of the abdomen and pelvis was performed using the standard protocol following bolus administration of intravenous contrast. CONTRAST:  OMNIPAQUE IOHEXOL 300 MG/ML  SOLN COMPARISON:  CT Chest, Abdomen, and Pelvis 08/21/2020. FINDINGS: Lower chest: Cardiac size at the upper limits of normal. Lung bases are negative aside from minor atelectasis. Hepatobiliary: Stable liver size and enhancement. Small hypodense area in the right lobe on series 2, image 23 is 5 mm, stable and too small to characterize. Negative gallbladder. No bile duct enlargement. No periportal edema is evident. Pancreas: Negative. Spleen: Stable splenomegaly, estimated splenic volume 692 mL. (normal splenic volume range 83 - 412 mL). No discrete splenic lesion. Adrenals/Urinary Tract: Negative adrenal glands. Symmetric renal enhancement and contrast excretion. Kidneys appears stable and within normal limits, tiny right renal midpole cysts suspected. Negative ureters. Stable and negative bladder. Stomach/Bowel: Decompressed and negative rectum. Redundant but  otherwise negative sigmoid colon. Mild retained stool in the descending colon. Largely decompressed transverse and right colon. Normal appendix on series 2, image 66. No large bowel inflammation. Fluid-filled but normal terminal ileum. No dilated small bowel. Stomach is stable and negative.  Mesenteric stranding along the 2nd portion of the duodenum has regressed since last month with mild residual on series 2, image 33. Otherwise negative duodenum. No free air, free fluid, new mesenteric inflammation. Vascular/Lymphatic: Major vascular structures appear patent without atherosclerosis, including patent portal venous system. Prominent portacaval lymph node measuring 16 mm short axis is stable and likely hepatitis related in this setting. No other lymphadenopathy. Reproductive: Negative. Other: No pelvic free fluid. Musculoskeletal: Partially visible healing left lateral rib fractures described last month. Subacute appearing left lumbar transverse process fractures again noted. No new osseous abnormality identified. IMPRESSION: 1. Largely stable CT appearance of the abdomen and pelvis from last month. No new abnormality identified. 2. Splenomegaly with suspected chronic liver disease, and likely hepatitis related mild portacaval lymphadenopathy. 3. Resolving possibly posttraumatic mesenteric inflammation adjacent to the duodenum. Subacute left rib and lumbar transverse process fractures. Electronically Signed   By: Odessa Fleming M.D.   On: 09/06/2020 05:13    Assessment: Brandon Lane is a 55 y.o. year old male with history of secondary syphilis, recently inpatient mid March 2022 with painless jaundice and found to have acute Hepatitis B. Although Tbili has continued to rise, transaminases have been slowly improving post-hospitalization and are followed closely as outpatient.  He is now admitted for observation due to acute nausea and vomiting, with onset after eating 4 pieces of [employer] stuffed crust pizza.  He received a single dose of Zofran yesterday and has been on IV fluids.  Since admission, he has had resolution of nausea and vomiting and tolerated breakfast well.  Denies abdominal pain.  Suspect his acute presentation was secondary to overindulging and possibly gastroenteritis. No acute  findings on CT this admission.    Acute hepatitis B: No signs of acute liver failure.  INR remains normal and he has no signs of hepatic encephalopathy.  He has been well-hydrated since admission and T bili is now down to 28.5 from 38 yesterday.  Transaminases continue to improve.  Dehydration was likely influencing worsening bilirubin.  Ongoing management continues to be largely supportive.  We will continue to monitor serial labs as outpatient and he will need to keep his follow-up appointment in May with [facility].   Anemia: Decline in hemoglobin to 10.2 today from 12.8 on admission.  Suspect this is secondary to hemodilution as he has received quite a bit of IV fluids.  No overt GI bleeding.  Continue to monitor.  Plan: 1.  Continue regular diet.   2.  Continue to monitor HFP, INR, and H/H while inpatient. 3.  Continue supportive measures. 4.  Will discontinue Questran at patient's request. He will monitor for return of itching.  5.  Anticipate discharge later this afternoon or early tomorrow morning. 6.  Encourage focusing on staying well-hydrated once discharged.  Also recommended eating 4-6 small meals daily and avoid overeating. 7.  Keep outpatient appointment in May 2022 with [facility]   LOS: 1 day    09/07/2020, 10:02 AM   Ermalinda Memos, PA-C [facility]

## 2020-09-07 NOTE — Progress Notes (Signed)
Progress note  Patient: Brandon Lane                            PCP: Janith Lima, MD                    DOB: 08/08/1965            DOA: 09/06/2020 OAC:[id number]             DOS: 09/07/2020, 1:39 PM  Patient coming from:   HOME  I have personally reviewed patient's medical records, in electronic medical records, including:  [place] link, and care everywhere.    Subjective   Patient was seen and examined this morning, sitting on side of bed, trying to initiate oral intake. Reporting improved nausea vomiting.... Mild improvement abdominal and headaches with pain medication.  Present at bedside still concerned about dark urine is jaundice  History of present illness:    Brandon Lane is a 55 y.o. 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  Presenting to the ED with intractable nausea vomiting since yesterday. Last meal was yesterday unable to tolerate p.o. now.  Vomitus has been nonbloody, nonbilious. Denied diarrhea.  Complaining of right lower quadrant, periumbilical abdominal discomfort   Patient Denies having: Fever, Chills, Cough, SOB, Chest Pain, Abd pain, N/V/D, headache, dizziness, lightheadedness,  Dysuria, Joint pain, rash, open wounds  ED Course:   Vitals: Abnormal labs; alk phos 234, albumin 3.64, lipase 70, AST 204, ALT 240, bilirubin 38.8,  bilirubin direct  30.0, UA positive for moderate bilirubin glucose ketones of 5 protein of 30 rare bacteria WBC of 21-50  CT abdomen/pelvis: -- IMPRESSION: 1. Largely stable CT appearance of the abdomen and pelvis from last month. No new abnormality identified. 2. Splenomegaly with suspected chronic liver disease, and likely hepatitis related mild portacaval lymphadenopathy. 3. Resolving possibly  posttraumatic mesenteric inflammation adjacentto the duodenum. Subacute left rib and lumbar transverse process fractures.      Assessment / Plan:   Principal Problem:   Intractable nausea and vomiting Active Problems:   Hepatitis   Closed rib fracture   DDD (degenerative disc disease), lumbar   Essential hypertension, benign   GERD (gastroesophageal reflux disease)   Secondary syphilis in male   Hyperbilirubinemia   Jaundice   Non-intractable vomiting   Principal Problem:    Intractable nausea and vomiting-abdominal pain -Improved nausea vomiting abdominal pain with antiemetics and pain medications -Willing to try initiate oral intake today  -Recent history of hepatitis B infection, transaminitis, hyperbilirubinemia -GI was contacted and discussed with ED staff-GI does not feel that this is related to his hepatitis B -CT abdomen reviewed no acute significant findings -We will continue with IV fluid hydration, as needed antiemetics -Likely due to ongoing viral infection hepatitis worsening liver function-as he took Tylenol products for headaches.  Recent history of hepatitis B/with transaminitis, hyperbilirubinemia-jaundice -LFTs are improving - (reports he took Tylenol products for significant headaches at home) Total bilirubin 35.5, 36.1, 38.0, >> 28.5 -Failed IV fluid hydration, monitor LFTs as above -LFTs:  Recent Labs  Lab 09/02/20 0825 09/03/20 1314 09/06/20 0242 09/07/20 0422  AST 445* 335* 204* 162*  ALT 507* 417* 240* 168*  ALKPHOS 221* 233* 234* 172*  BILITOT 35.5* 36.1* 38.0* 28.5*  PROT 7.8 8.1  9.1* 7.3  ALBUMIN 2.8* 3.0* 3.4* 2.8*      GI following: Appreciate recommendations  Active Problems:  Recent history of fall, with sustaining closed rib fracture -continue as needed analgesics   DDD (degenerative disc disease), lumbar -stable continue as needed analgesics   Essential hypertension, benign -BP stable, not on any home medication, as needed  hydralazine   GERD (gastroesophageal reflux disease) -resuming PPI   H/o Secondary syphilis in male -posttreatment, no active lesions   Jaundice -due to hyperbilirubinemia, will continue to monitor closely no signs of encephalopathy    Cultures:  -none  Antimicrobial: -none   Consults called:  GI ---------------------------------------------------------------------------------------------------------------------------------------- DVT prophylaxis: SCD/Compression stockings and Heparin SQ Code Status:   Code Status: Full Code   Admission status: Patient will be admitted as Inpatient, with a greater than 2 midnight length of stay. Level of care: Telemetry   Family Communication:  none at bedside  (The above findings and plan of care has been discussed with patient in detail, the patient expressed understanding and agreement of above plan)  ----------------------------------------------------------------------------------------------------------------------------------------  Disposition Plan:  Anticipated 1-2 days Status is: Inpatient  Remains inpatient appropriate because:Inpatient level of care appropriate due to severity of illness   Dispo: The patient is from: Home              Anticipated d/c is to: To be discharged home in a.m. if tolerating p.o.              Patient currently is not medically stable to d/c.   Difficult to place patient No  ----------------------------------------------------------------------------------------------------------------------------------------   . heparin  5,000 Units Subcutaneous Q8H  . multivitamin with minerals  1 tablet Oral Daily  . sodium chloride flush  3 mL Intravenous Q12H  . sodium chloride flush  3 mL Intravenous Q12H  . venlafaxine XR  150 mg Oral Q breakfast     PRN MEDs: sodium chloride, bisacodyl, hydrALAZINE, HYDROmorphone (DILAUDID) injection, ibuprofen, ipratropium, levalbuterol, magnesium citrate, ondansetron  **OR** ondansetron (ZOFRAN) IV, oxyCODONE, senna-docusate, sodium chloride flush   -----------------------------------------------------------------------------------------------------------------------------------    Physical Exam:   Vitals:   09/07/20 0411 09/07/20 0419 09/07/20 0500 09/07/20 0730  BP: 133/84 133/84  128/72  Pulse: 63 65  71  Resp:  19  18  Temp:  (!) 97.5 F (36.4 C)  (!) 97.5 F (36.4 C)  TempSrc:  Oral  Oral  SpO2: 99% 98%  98%  Weight:   98.1 kg   Height:         Physical Exam:   General:  Alert, oriented, cooperative, reporting improved headache, abdominal pain with pain medication  HEENT:   Icteric sclera normocephalic, PERRL, otherwise with in Normal limits   Neuro:  CNII-XII intact. , normal motor and sensation, reflexes intact   Lungs:   Clear to auscultation BL, Respirations unlabored, no wheezes / crackles  Cardio:    S1/S2, RRR, No murmure, No Rubs or Gallops   Abdomen:   Soft, non-tender, bowel sounds active all four quadrants,  no guarding or peritoneal signs.  Muscular skeletal:  Limited exam - in bed, able to move all 4 extremities, Normal strength,  2+ pulses,  symmetric, No pitting edema  Skin:  Dry, warm to touch, negative for any Rashes, Significant discoloration of skin-jaundice, icteric sclera  Wounds: Please see nursing documentation    Labs on admission:    I have personally reviewed following labs and imaging studies  CBC: Recent Labs  Lab 09/06/20 0243 09/07/20 0422  WBC  10.7* 5.3  NEUTROABS 8.0*  --   HGB 12.8* 10.2*  HCT RESULTS UNAVAILABLE DUE TO INTERFERING SUBSTANCE RESULTS UNAVAILABLE DUE TO INTERFERING SUBSTANCE  MCV RESULTS UNAVAILABLE DUE TO INTERFERING SUBSTANCE RESULTS UNAVAILABLE DUE TO INTERFERING SUBSTANCE  PLT 366 [id number]   Basic Metabolic Panel: Recent Labs  Lab 09/06/20 0242 09/07/20 0422  NA 135 137  K 3.5 4.0  CL 98 102  CO2 23 25  GLUCOSE 126* 84  BUN 11 11  CREATININE 0.30* 0.39*  CALCIUM  9.7 9.1   GFR: Estimated Creatinine Clearance: 123.4 mL/min (A) (by C-G formula based on SCr of 0.39 mg/dL (L)). Liver Function Tests: Recent Labs  Lab 09/02/20 0825 09/03/20 1314 09/06/20 0242 09/07/20 0422  AST 445* 335* 204* 162*  ALT 507* 417* 240* 168*  ALKPHOS 221* 233* 234* 172*  BILITOT 35.5* 36.1* 38.0* 28.5*  PROT 7.8 8.1 9.1* 7.3  ALBUMIN 2.8* 3.0* 3.4* 2.8*   Recent Labs  Lab 09/06/20 0242  LIPASE 70*   Recent Labs  Lab 09/02/20 0825 09/06/20 0242  AMMONIA 34 21   Coagulation Profile: Recent Labs  Lab 09/01/20 1022 09/02/20 0825 09/03/20 1314 09/06/20 0242 09/07/20 0422  INR 1.0 1.0 1.0 1.1 1.0  CBG: Recent Labs  Lab 09/06/20 1042 09/07/20 0748  GLUCAP 109* 91  Urine analysis:    Component Value Date/Time   COLORURINE AMBER (A) 09/06/2020 0118   APPEARANCEUR HAZY (A) 09/06/2020 0118   LABSPEC 1.020 09/06/2020 0118   PHURINE 6.0 09/06/2020 0118   GLUCOSEU 50 (A) 09/06/2020 0118   GLUCOSEU NEGATIVE 05/19/2020 1106   HGBUR NEGATIVE 09/06/2020 0118   BILIRUBINUR MODERATE (A) 09/06/2020 0118   KETONESUR 5 (A) 09/06/2020 0118   PROTEINUR 30 (A) 09/06/2020 0118   UROBILINOGEN >=8.0 (A) 05/19/2020 1106   NITRITE NEGATIVE 09/06/2020 0118   LEUKOCYTESUR NEGATIVE 09/06/2020 0118     Radiologic Exams on Admission:   CT ABDOMEN PELVIS W CONTRAST  Result Date: 09/06/2020 CLINICAL DATA:  55 year old male with right abdominal pain and jaundice. Vomiting. Recent diagnosis of hepatitis-B. EXAM: CT ABDOMEN AND PELVIS WITH CONTRAST TECHNIQUE: Multidetector CT imaging of the abdomen and pelvis was performed using the standard protocol following bolus administration of intravenous contrast. CONTRAST:  177mL OMNIPAQUE IOHEXOL 300 MG/ML  SOLN COMPARISON:  CT Chest, Abdomen, and Pelvis 08/21/2020. FINDINGS: Lower chest: Cardiac size at the upper limits of normal. Lung bases are negative aside from minor atelectasis. Hepatobiliary: Stable liver size and  enhancement. Small hypodense area in the right lobe on series 2, image 23 is 5 mm, stable and too small to characterize. Negative gallbladder. No bile duct enlargement. No periportal edema is evident. Pancreas: Negative. Spleen: Stable splenomegaly, estimated splenic volume 692 mL. (normal splenic volume range 83 - 412 mL). No discrete splenic lesion. Adrenals/Urinary Tract: Negative adrenal glands. Symmetric renal enhancement and contrast excretion. Kidneys appears stable and within normal limits, tiny right renal midpole cysts suspected. Negative ureters. Stable and negative bladder. Stomach/Bowel: Decompressed and negative rectum. Redundant but otherwise negative sigmoid colon. Mild retained stool in the descending colon. Largely decompressed transverse and right colon. Normal appendix on series 2, image 66. No large bowel inflammation. Fluid-filled but normal terminal ileum. No dilated small bowel. Stomach is stable and negative. Mesenteric stranding along the 2nd portion of the duodenum has regressed since last month with mild residual on series 2, image 33. Otherwise negative duodenum. No free air, free fluid, new mesenteric inflammation. Vascular/Lymphatic: Major vascular structures appear patent without atherosclerosis, including patent  portal venous system. Prominent portacaval lymph node measuring 16 mm short axis is stable and likely hepatitis related in this setting. No other lymphadenopathy. Reproductive: Negative. Other: No pelvic free fluid. Musculoskeletal: Partially visible healing left lateral rib fractures described last month. Subacute appearing left lumbar transverse process fractures again noted. No new osseous abnormality identified. IMPRESSION: 1. Largely stable CT appearance of the abdomen and pelvis from last month. No new abnormality identified. 2. Splenomegaly with suspected chronic liver disease, and likely hepatitis related mild portacaval lymphadenopathy. 3. Resolving possibly  posttraumatic mesenteric inflammation adjacent to the duodenum. Subacute left rib and lumbar transverse process fractures. Electronically Signed   By: Genevie Ann M.D.   On: 09/06/2020 05:13    EKG:   Independently reviewed.  Orders placed or performed during the hospital encounter of 09/06/20  . EKG 12-Lead  . EKG 12-Lead  . EKG 12-Lead  . EKG 12-Lead  . EKG 12-Lead  . EKG   ---------------------------------------------------------------------------------------------------------------------------------------  Time spent: > than  55  Min.   SIGNED: Deatra James, MD, FHM. [facility],  Pager (Please use [email or web address] to page to text)  If 7PM-7AM, please contact night-coverage [email or web address],  09/07/2020, 1:39 PM

## 2020-09-08 DIAGNOSIS — I10 Essential (primary) hypertension: Secondary | ICD-10-CM

## 2020-09-08 DIAGNOSIS — R112 Nausea with vomiting, unspecified: Principal | ICD-10-CM

## 2020-09-08 LAB — COMPREHENSIVE METABOLIC PANEL
ALT: 142 U/L — ABNORMAL HIGH (ref 0–44)
AST: 153 U/L — ABNORMAL HIGH (ref 15–41)
Albumin: 2.7 g/dL — ABNORMAL LOW (ref 3.5–5.0)
Alkaline Phosphatase: 169 U/L — ABNORMAL HIGH (ref 38–126)
Anion gap: 9 (ref 5–15)
BUN: 12 mg/dL (ref 6–20)
CO2: 27 mmol/L (ref 22–32)
Calcium: 9 mg/dL (ref 8.9–10.3)
Chloride: 102 mmol/L (ref 98–111)
Creatinine, Ser: 0.43 mg/dL — ABNORMAL LOW (ref 0.61–1.24)
GFR, Estimated: >60 mL/min (ref >60)
Glucose, Bld: 85 mg/dL (ref 70–99)
Potassium: 3.6 mmol/L (ref 3.5–5.1)
Sodium: 138 mmol/L (ref 135–145)
Total Bilirubin: 24.8 mg/dL (ref 0.3–1.2)
Total Protein: 7.1 g/dL (ref 6.5–8.1)

## 2020-09-08 LAB — CBC
Hemoglobin: 9.7 g/dL — ABNORMAL LOW (ref 13.0–17.0)
Platelets: 308 10*3/uL (ref 150–400)
WBC: 4.8 10*3/uL (ref 4.0–10.5)

## 2020-09-08 LAB — GLUCOSE, CAPILLARY: Glucose-Capillary: 73 mg/dL (ref 70–99)

## 2020-09-08 MED ORDER — OXYCODONE HCL 5 MG PO TABS: 5.0000 mg | ORAL_TABLET | ORAL | 0 refills | Status: DC | PRN

## 2020-09-08 NOTE — Discharge Summary (Signed)
Physician Discharge Summary  Lary Eckardt WEX:[id number] DOB: [birth year] DOA: 09/06/2020  PCP: Janith Lima, MD  Admit date: 09/06/2020 Discharge date: 09/08/2020  Admitted From: Home Disposition:  Home  Recommendations for Outpatient Follow-up:  1. Follow up with PCP in 1-2 weeks 2. Please obtain CMP/CBC in one week     Discharge Condition: Stable CODE STATUS: FULL Diet recommendation: Heart Healthy    Brief/Interim Summary: 54.o.malewith medical history significant ofwith past medical history of acute hepatitis B, GERD, OSA, history of syphilis, hypertension, depression, DDD, status post recent fall from a ladder approximately 4 weeks ago,falling backwards hitting around 4 to 5 feet on his back presenting today with acute n/v and abd pain. He was unable to tolerate liquids at home and had no relief. He notes that he ate 4 pieces of [employer] stuffed crust pizza prior to onset of acute N/V. No abdominal pain while vomiting, but he does note some mild discomfort lower abdomen after heaving, which has now improved.  Patient denies fevers, chills, headache, chest pain, dyspnea,  dysuria, hematuria, hematochezia, and melena. He was placed on bowel rest, IVF and judicious opioids.  GI was consulted again and felt that that patient had a nonspecific gastroenteritis not related to his hepatitis B.  His LFTs were trended and continued to improve.  He is diet was advanced which he tolerated.  Discharge Diagnoses:  Intractable n/v, abd pain -due to clinical picture is most consistent with a possible viral gastroenteritis that has improved with supportive measures  -appreciate GI consult-->unrelated to acute hep B infection  Hyperbilirubinemia-jaundice -due to hepatitis B infection -Continue to monitor closely, currently not complaining of confusion headaches abdominal pain or asterixis -Monitoring LFTs-overall reaching ALT plateau --CT abdomen pelvis-reviewed: Mildly lobular  hepatic surface, suggestive of cirrhosis. Splenomegaly, could be an early manifestation of portal hypertension in the setting of cirrhosis.Minimal stranding adjacent the first and second portion of the duodenum withOUTsignificant wall thickening, extraluminal gas or free fluid -abdominal ultrasound-does not reveal any signs of obstruction at this time, therefore no need for MRCP, -Gastroenterology consult, appreciate their input -Continue IV fluid hydration  Acute hepatitis B infection  Hepatitis BcoreIgM positive, pending IgG -Hep B sAg positive -Continues to haveelevated LFTs but improving Alk phos 267 >> 225 >> 231>> 218>>169 AST 720 >> 738 >> 911>>905>>[id number]>>>153 ALT 724 >> 687 >> 766>>746>>809>>142 Lipase 70 -INR 1.0, 1.1, -avoid hepatotoxic agents -GI following recommending outpatient follow-up with hepatologist -check hep B e antigen--positive -hep B e antibody--neg -check hep B DNA--pending -check hep B surface antibody--neg -check hep A antibody--non-reactive -check Hep B DNA--5.41M copies  Closed rib fracture -incentive spirometer, needed analgesics -Continue to complain of pain but improved with pain medication -Status post recent fall, sustaining rib fractures -CT scan reviewed-revealingleft posterior4th - 8th rib fractures.  Lumbar fracture CT abdomen pelvis revealing-Subacute appearing left L1-L3 transversefractures As needed analgesics, PT OT evaluation -Remained stable  Active Problems:  GERD(gastroesophageal reflux disease) -not on PPI -pt prefers observation  OSA(obstructive sleep apnea)  -outpatient referral for sleep study, does not supply oxygen with CPAP at home  Essential hypertension, benign -BP remained stable - not on any home medication at this time  Class 1 obesitydue to excess calories with serious comorbidity and body mass index (BMI) of 33.0 to 33.9 in adult -consulted regarding weight  loss  H/oSecondaryLate Latentsyphilis -apparently posttreatment  depression -resuming home medication of Effexor  Discharge Instructions   Allergies as of 09/08/2020   No Known Allergies  Medication List    STOP taking these medications   cholestyramine 4 g packet Commonly known as: Questran     TAKE these medications   oxyCODONE 5 MG immediate release tablet Commonly known as: Oxy IR/ROXICODONE Take 1 tablet (5 mg total) by mouth every 4 (four) hours as needed for moderate pain.   venlafaxine XR 150 MG 24 hr capsule Commonly known as: EFFEXOR-XR TAKE 1 CAPSULE BY MOUTH DAILY WITH BREAKFAST.       No Known Allergies  Consultations:  GI   Procedures/Studies: DG Ribs Unilateral W/Chest Left  Result Date: 08/21/2020 CLINICAL DATA:  55 year old male with fall and left chest wall pain. EXAM: LEFT RIBS AND CHEST - 3+ VIEW COMPARISON:  None. FINDINGS: There multiple mildly displaced left posterior rib fractures involving the fourth-eighth ribs. No focal consolidation, pleural effusion, pneumothorax. The cardiac silhouette is within limits. IMPRESSION: Multiple mildly displaced left posterior rib fractures. No pneumothorax. Electronically Signed   By: Anner Crete M.D.   On: 08/21/2020 02:29   DG Thoracic Spine 2 View  Result Date: 08/21/2020 CLINICAL DATA:  55 year old male with fall. EXAM: THORACIC SPINE 2 VIEWS COMPARISON:  Chest radiograph dated 08/21/2020. FINDINGS: There is no acute fracture or subluxation of the thoracic spine. Multilevel degenerative changes and mild chronic compression fractures. Partially visualized multiple left posterior rib fractures. The soft tissues are unremarkable. IMPRESSION: 1. No acute fracture or subluxation of the thoracic spine. 2. Left posterior rib fractures. Electronically Signed   By: Anner Crete M.D.   On: 08/21/2020 02:31   CT Chest W Contrast  Result Date: 08/21/2020 CLINICAL DATA:  Fall from ladder 3  weeks ago, persistent back pain. EXAM: CT CHEST, ABDOMEN, AND PELVIS WITH CONTRAST TECHNIQUE: Multidetector CT imaging of the chest, abdomen and pelvis was performed following the standard protocol during bolus administration of intravenous contrast. CONTRAST:  156m OMNIPAQUE IOHEXOL 300 MG/ML  SOLN COMPARISON:  Radiographs 08/21/2020 FINDINGS: CT CHEST FINDINGS Cardiovascular: The aortic root is suboptimally assessed given cardiac pulsation artifact. The aorta is normal caliber. No acute luminal abnormality of the imaged aorta. No periaortic stranding or hemorrhage. Normal 3 vessel branching of the aortic arch. Proximal great vessels are unremarkable. Normal heart size. No pericardial effusion. Central pulmonary arteries are normal caliber no large central filling defects with more distal evaluation limited by non tailored examination. No major venous abnormalities are seen. Mediastinum/Nodes: No mediastinal fluid or gas. Normal thyroid gland and thoracic inlet. No acute abnormality of the trachea or esophagus. No worrisome mediastinal, hilar or axillary adenopathy. Lungs/Pleura: No acute traumatic abnormality of the lung parenchyma. Thickening adjacent the contiguous posterior left-sided rib fractures appears to be largely subpleural without layering effusion or pneumothorax. Some minimal adjacent ground-glass could be atelectatic or reflective atelectasis versus resolving pulmonary contusive change. Lungs are otherwise clear. No consolidative process or edema. Musculoskeletal: Subacute appearing left posterior fourth through eighth rib fractures. Adjacent thickening appears largely subpleural, as above. No right-sided rib fractures. No other acute traumatic osseous injuries of the chest wall or imaged thoracic spine. Mild dextrocurvature of the mid to upper thoracic levels. CT ABDOMEN PELVIS FINDINGS Hepatobiliary: No direct hepatic injury or perihepatic hematoma. Solitary subcentimeter hypoattenuating focus in  the right lobe liver (8/16), statistically likely benign. No worrisome focal liver lesions. Mildly lobular hepatic surface contour. Normal hepatic attenuation. Normal gallbladder and biliary tree. No significant biliary ductal dilatation or visible gallstones. Pancreas: No pancreatic ductal dilatation or surrounding inflammatory changes. Spleen: Splenomegaly. No focal splenic lesions. No direct  splenic injury or perisplenic hematoma. Adrenals/Urinary Tract: Normal adrenal glands. Kidneys are normally located with symmetric enhancementand excretion. Few subcentimeter hypoattenuating foci are too small to fully characterize. No suspicious renal lesion, urolithiasis or hydronephrosis. Traumatic or acute bladder abnormality is seen. Stomach/Bowel: Distal esophagus, stomach are unremarkable. Minimal stranding adjacent the first and second portion of the duodenum (2/65) without significant wall thickening, extraluminal gas or free fluid. No small bowel wall thickening or dilatation. No evidence of obstruction. A normal appendix is visualized. No colonic dilatation or wall thickening. Vascular/Lymphatic: No significant vascular findings are present. No enlarged abdominal or pelvic lymph nodes. Reproductive: The prostate and seminal vesicles are unremarkable. No acute abnormality of the external genitalia. Other: No abdominopelvic free fluid or free gas. No bowel containing hernias. Musculoskeletal: No acute osseous abnormality or suspicious osseous lesion. Subacute appearing left L1-L3 transverse process fractures. 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 Levocurvature of the lumbar spine, apex L4-5. Multilevel degenerative changes are present in the imaged portions of the spine. Bones of the pelvis are intact and congruent. Proximal femora are intact and normally located. IMPRESSION: 1. Subacute appearing left posterior fourth through eighth rib fractures.  Adjacent thickening appears to be largely subpleural without layering effusion or pneumothorax. Some minimal adjacent ground-glass could be atelectatic related to splinting versus resolving pulmonary contusive change. 2. Subacute appearing left L1-L3 transverse process fractures. 3. Minimal stranding adjacent the first and second portion of the duodenum without significant wall thickening, extraluminal gas or free fluid. Findings could reflect a mild duodenitis or peptic ulcer disease in the appropriate clinical setting 4. Mildly lobular hepatic surface, suggestive of cirrhosis. Splenomegaly, could be an early manifestation of portal hypertension in the setting of cirrhosis. 5. 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 Anatomic variant. These results were called by telephone at the time of interpretation on 08/21/2020 at 6:10 am to provider [facility] , who verbally acknowledged these results. Electronically Signed   By: Lovena Le M.D.   On: 08/21/2020 06:10   CT ABDOMEN PELVIS W CONTRAST  Result Date: 09/06/2020 CLINICAL DATA:  55 year old male with right abdominal pain and jaundice. Vomiting. Recent diagnosis of hepatitis-B. EXAM: CT ABDOMEN AND PELVIS WITH CONTRAST TECHNIQUE: Multidetector CT imaging of the abdomen and pelvis was performed using the standard protocol following bolus administration of intravenous contrast. CONTRAST:  178m OMNIPAQUE IOHEXOL 300 MG/ML  SOLN COMPARISON:  CT Chest, Abdomen, and Pelvis 08/21/2020. FINDINGS: Lower chest: Cardiac size at the upper limits of normal. Lung bases are negative aside from minor atelectasis. Hepatobiliary: Stable liver size and enhancement. Small hypodense area in the right lobe on series 2, image 23 is 5 mm, stable and too small to characterize. Negative gallbladder. No bile duct enlargement. No periportal edema is evident. Pancreas: Negative. Spleen: Stable splenomegaly, estimated splenic  volume 692 mL. (normal splenic volume range 83 - 412 mL). No discrete splenic lesion. Adrenals/Urinary Tract: Negative adrenal glands. Symmetric renal enhancement and contrast excretion. Kidneys appears stable and within normal limits, tiny right renal midpole cysts suspected. Negative ureters. Stable and negative bladder. Stomach/Bowel: Decompressed and negative rectum. Redundant but otherwise negative sigmoid colon. Mild retained stool in the descending colon. Largely decompressed transverse and right colon. Normal appendix on series 2, image 66. No large bowel inflammation. Fluid-filled but normal terminal ileum. No dilated small bowel. Stomach is stable and negative. Mesenteric stranding along the 2nd portion of the duodenum has regressed  since last month with mild residual on series 2, image 33. Otherwise negative duodenum. No free air, free fluid, new mesenteric inflammation. Vascular/Lymphatic: Major vascular structures appear patent without atherosclerosis, including patent portal venous system. Prominent portacaval lymph node measuring 16 mm short axis is stable and likely hepatitis related in this setting. No other lymphadenopathy. Reproductive: Negative. Other: No pelvic free fluid. Musculoskeletal: Partially visible healing left lateral rib fractures described last month. Subacute appearing left lumbar transverse process fractures again noted. No new osseous abnormality identified. IMPRESSION: 1. Largely stable CT appearance of the abdomen and pelvis from last month. No new abnormality identified. 2. Splenomegaly with suspected chronic liver disease, and likely hepatitis related mild portacaval lymphadenopathy. 3. Resolving possibly posttraumatic mesenteric inflammation adjacent to the duodenum. Subacute left rib and lumbar transverse process fractures. Electronically Signed   By: Genevie Ann M.D.   On: 09/06/2020 05:13   CT ABDOMEN PELVIS W CONTRAST  Result Date: 08/21/2020 CLINICAL DATA:  Fall from  ladder 3 weeks ago, persistent back pain. EXAM: CT CHEST, ABDOMEN, AND PELVIS WITH CONTRAST TECHNIQUE: Multidetector CT imaging of the chest, abdomen and pelvis was performed following the standard protocol during bolus administration of intravenous contrast. CONTRAST:  120m OMNIPAQUE IOHEXOL 300 MG/ML  SOLN COMPARISON:  Radiographs 08/21/2020 FINDINGS: CT CHEST FINDINGS Cardiovascular: The aortic root is suboptimally assessed given cardiac pulsation artifact. The aorta is normal caliber. No acute luminal abnormality of the imaged aorta. No periaortic stranding or hemorrhage. Normal 3 vessel branching of the aortic arch. Proximal great vessels are unremarkable. Normal heart size. No pericardial effusion. Central pulmonary arteries are normal caliber no large central filling defects with more distal evaluation limited by non tailored examination. No major venous abnormalities are seen. Mediastinum/Nodes: No mediastinal fluid or gas. Normal thyroid gland and thoracic inlet. No acute abnormality of the trachea or esophagus. No worrisome mediastinal, hilar or axillary adenopathy. Lungs/Pleura: No acute traumatic abnormality of the lung parenchyma. Thickening adjacent the contiguous posterior left-sided rib fractures appears to be largely subpleural without layering effusion or pneumothorax. Some minimal adjacent ground-glass could be atelectatic or reflective atelectasis versus resolving pulmonary contusive change. Lungs are otherwise clear. No consolidative process or edema. Musculoskeletal: Subacute appearing left posterior fourth through eighth rib fractures. Adjacent thickening appears largely subpleural, as above. No right-sided rib fractures. No other acute traumatic osseous injuries of the chest wall or imaged thoracic spine. Mild dextrocurvature of the mid to upper thoracic levels. CT ABDOMEN PELVIS FINDINGS Hepatobiliary: No direct hepatic injury or perihepatic hematoma. Solitary subcentimeter hypoattenuating  focus in the right lobe liver (8/16), statistically likely benign. No worrisome focal liver lesions. Mildly lobular hepatic surface contour. Normal hepatic attenuation. Normal gallbladder and biliary tree. No significant biliary ductal dilatation or visible gallstones. Pancreas: No pancreatic ductal dilatation or surrounding inflammatory changes. Spleen: Splenomegaly. No focal splenic lesions. No direct splenic injury or perisplenic hematoma. Adrenals/Urinary Tract: Normal adrenal glands. Kidneys are normally located with symmetric enhancementand excretion. Few subcentimeter hypoattenuating foci are too small to fully characterize. No suspicious renal lesion, urolithiasis or hydronephrosis. Traumatic or acute bladder abnormality is seen. Stomach/Bowel: Distal esophagus, stomach are unremarkable. Minimal stranding adjacent the first and second portion of the duodenum (2/65) without significant wall thickening, extraluminal gas or free fluid. No small bowel wall thickening or dilatation. No evidence of obstruction. A normal appendix is visualized. No colonic dilatation or wall thickening. Vascular/Lymphatic: No significant vascular findings are present. No enlarged abdominal or pelvic lymph nodes. Reproductive: The prostate and seminal vesicles are  unremarkable. No acute abnormality of the external genitalia. Other: No abdominopelvic free fluid or free gas. No bowel containing hernias. Musculoskeletal: No acute osseous abnormality or suspicious osseous lesion. Subacute appearing left L1-L3 transverse process fractures. 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 Levocurvature of the lumbar spine, apex L4-5. Multilevel degenerative changes are present in the imaged portions of the spine. Bones of the pelvis are intact and congruent. Proximal femora are intact and normally located. IMPRESSION: 1. Subacute appearing left posterior fourth through eighth rib  fractures. Adjacent thickening appears to be largely subpleural without layering effusion or pneumothorax. Some minimal adjacent ground-glass could be atelectatic related to splinting versus resolving pulmonary contusive change. 2. Subacute appearing left L1-L3 transverse process fractures. 3. Minimal stranding adjacent the first and second portion of the duodenum without significant wall thickening, extraluminal gas or free fluid. Findings could reflect a mild duodenitis or peptic ulcer disease in the appropriate clinical setting 4. Mildly lobular hepatic surface, suggestive of cirrhosis. Splenomegaly, could be an early manifestation of portal hypertension in the setting of cirrhosis. 5. 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 Anatomic variant. These results were called by telephone at the time of interpretation on 08/21/2020 at 6:10 am to provider [facility] , who verbally acknowledged these results. Electronically Signed   By: Lovena Le M.D.   On: 08/21/2020 06:10   US Abdomen Limited RUQ (LIVER/GB)  Result Date: 08/21/2020 CLINICAL DATA:  Pain of the abdomen in status post fall 3 weeks ago. EXAM: ULTRASOUND ABDOMEN LIMITED RIGHT UPPER QUADRANT COMPARISON:  CT abdomen pelvis August 21, 2020 FINDINGS: Gallbladder: No gallstones or wall thickening visualized. No sonographic Murphy sign noted by sonographer. Common bile duct: Diameter: 4 mm Liver: There is a 0.9 x 0.8 x 1 cm simple cyst in the right lobe liver correlating to CT finding. Within normal limits in parenchymal echogenicity. Portal vein is patent on color Doppler imaging with normal direction of blood flow towards the liver. Other: None. IMPRESSION: No sonographic evidence of acute cholecystitis. Simple cyst right lobe liver. Electronically Signed   By: Abelardo Diesel M.D.   On: 08/21/2020 09:22        Discharge Exam: Vitals:   09/07/20 2010 09/08/20 0500  BP: 133/73 125/78   Pulse: 67 68  Resp: 20 18  Temp: 98.2 F (36.8 C) 98.2 F (36.8 C)  SpO2: 100% 99%   Vitals:   09/07/20 0500 09/07/20 0730 09/07/20 2010 09/08/20 0500  BP:  128/72 133/73 125/78  Pulse:  71 67 68  Resp:  _0 Temp:  (!) 97.5 F (36.4 C) 98.2 F (36.8 C) 98.2 F (36.8 C)  TempSrc:  Oral Oral Oral  SpO2:  98% 100% 99%  Weight: 98.1 kg   98.5 kg  Height:        General: Pt is alert, awake, not in acute distress Cardiovascular: RRR, S1/S2 +, no rubs, no gallops Respiratory: CTA bilaterally, no wheezing, no rhonchi Abdominal: Soft, NT, ND, bowel sounds + Extremities: no edema, no cyanosis   The results of significant diagnostics from this hospitalization (including imaging, microbiology, ancillary and laboratory) are listed below for reference.    Significant Diagnostic Studies: DG Ribs Unilateral W/Chest Left  Result Date: 08/21/2020 CLINICAL DATA:  55 year old male with fall and left chest wall pain. EXAM: LEFT RIBS AND CHEST - 3+ VIEW COMPARISON:  None. FINDINGS: There multiple mildly displaced left posterior  rib fractures involving the fourth-eighth ribs. No focal consolidation, pleural effusion, pneumothorax. The cardiac silhouette is within limits. IMPRESSION: Multiple mildly displaced left posterior rib fractures. No pneumothorax. Electronically Signed   By: Anner Crete M.D.   On: 08/21/2020 02:29   DG Thoracic Spine 2 View  Result Date: 08/21/2020 CLINICAL DATA:  55 year old male with fall. EXAM: THORACIC SPINE 2 VIEWS COMPARISON:  Chest radiograph dated 08/21/2020. FINDINGS: There is no acute fracture or subluxation of the thoracic spine. Multilevel degenerative changes and mild chronic compression fractures. Partially visualized multiple left posterior rib fractures. The soft tissues are unremarkable. IMPRESSION: 1. No acute fracture or subluxation of the thoracic spine. 2. Left posterior rib fractures. Electronically Signed   By: Anner Crete M.D.   On:  08/21/2020 02:31   CT Chest W Contrast  Result Date: 08/21/2020 CLINICAL DATA:  Fall from ladder 3 weeks ago, persistent back pain. EXAM: CT CHEST, ABDOMEN, AND PELVIS WITH CONTRAST TECHNIQUE: Multidetector CT imaging of the chest, abdomen and pelvis was performed following the standard protocol during bolus administration of intravenous contrast. CONTRAST:  133m OMNIPAQUE IOHEXOL 300 MG/ML  SOLN COMPARISON:  Radiographs 08/21/2020 FINDINGS: CT CHEST FINDINGS Cardiovascular: The aortic root is suboptimally assessed given cardiac pulsation artifact. The aorta is normal caliber. No acute luminal abnormality of the imaged aorta. No periaortic stranding or hemorrhage. Normal 3 vessel branching of the aortic arch. Proximal great vessels are unremarkable. Normal heart size. No pericardial effusion. Central pulmonary arteries are normal caliber no large central filling defects with more distal evaluation limited by non tailored examination. No major venous abnormalities are seen. Mediastinum/Nodes: No mediastinal fluid or gas. Normal thyroid gland and thoracic inlet. No acute abnormality of the trachea or esophagus. No worrisome mediastinal, hilar or axillary adenopathy. Lungs/Pleura: No acute traumatic abnormality of the lung parenchyma. Thickening adjacent the contiguous posterior left-sided rib fractures appears to be largely subpleural without layering effusion or pneumothorax. Some minimal adjacent ground-glass could be atelectatic or reflective atelectasis versus resolving pulmonary contusive change. Lungs are otherwise clear. No consolidative process or edema. Musculoskeletal: Subacute appearing left posterior fourth through eighth rib fractures. Adjacent thickening appears largely subpleural, as above. No right-sided rib fractures. No other acute traumatic osseous injuries of the chest wall or imaged thoracic spine. Mild dextrocurvature of the mid to upper thoracic levels. CT ABDOMEN PELVIS FINDINGS  Hepatobiliary: No direct hepatic injury or perihepatic hematoma. Solitary subcentimeter hypoattenuating focus in the right lobe liver (8/16), statistically likely benign. No worrisome focal liver lesions. Mildly lobular hepatic surface contour. Normal hepatic attenuation. Normal gallbladder and biliary tree. No significant biliary ductal dilatation or visible gallstones. Pancreas: No pancreatic ductal dilatation or surrounding inflammatory changes. Spleen: Splenomegaly. No focal splenic lesions. No direct splenic injury or perisplenic hematoma. Adrenals/Urinary Tract: Normal adrenal glands. Kidneys are normally located with symmetric enhancementand excretion. Few subcentimeter hypoattenuating foci are too small to fully characterize. No suspicious renal lesion, urolithiasis or hydronephrosis. Traumatic or acute bladder abnormality is seen. Stomach/Bowel: Distal esophagus, stomach are unremarkable. Minimal stranding adjacent the first and second portion of the duodenum (2/65) without significant wall thickening, extraluminal gas or free fluid. No small bowel wall thickening or dilatation. No evidence of obstruction. A normal appendix is visualized. No colonic dilatation or wall thickening. Vascular/Lymphatic: No significant vascular findings are present. No enlarged abdominal or pelvic lymph nodes. Reproductive: The prostate and seminal vesicles are unremarkable. No acute abnormality of the external genitalia. Other: No abdominopelvic free fluid or free gas. No bowel containing hernias.  Musculoskeletal: No acute osseous abnormality or suspicious osseous lesion. Subacute appearing left L1-L3 transverse process fractures. 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 Levocurvature of the lumbar spine, apex L4-5. Multilevel degenerative changes are present in the imaged portions of the spine. Bones of the pelvis are intact and congruent. Proximal femora are intact  and normally located. IMPRESSION: 1. Subacute appearing left posterior fourth through eighth rib fractures. Adjacent thickening appears to be largely subpleural without layering effusion or pneumothorax. Some minimal adjacent ground-glass could be atelectatic related to splinting versus resolving pulmonary contusive change. 2. Subacute appearing left L1-L3 transverse process fractures. 3. Minimal stranding adjacent the first and second portion of the duodenum without significant wall thickening, extraluminal gas or free fluid. Findings could reflect a mild duodenitis or peptic ulcer disease in the appropriate clinical setting 4. Mildly lobular hepatic surface, suggestive of cirrhosis. Splenomegaly, could be an early manifestation of portal hypertension in the setting of cirrhosis. 5. 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 Anatomic variant. These results were called by telephone at the time of interpretation on 08/21/2020 at 6:10 am to provider [facility] , who verbally acknowledged these results. Electronically Signed   By: Lovena Le M.D.   On: 08/21/2020 06:10   CT ABDOMEN PELVIS W CONTRAST  Result Date: 09/06/2020 CLINICAL DATA:  55 year old male with right abdominal pain and jaundice. Vomiting. Recent diagnosis of hepatitis-B. EXAM: CT ABDOMEN AND PELVIS WITH CONTRAST TECHNIQUE: Multidetector CT imaging of the abdomen and pelvis was performed using the standard protocol following bolus administration of intravenous contrast. CONTRAST:  186m OMNIPAQUE IOHEXOL 300 MG/ML  SOLN COMPARISON:  CT Chest, Abdomen, and Pelvis 08/21/2020. FINDINGS: Lower chest: Cardiac size at the upper limits of normal. Lung bases are negative aside from minor atelectasis. Hepatobiliary: Stable liver size and enhancement. Small hypodense area in the right lobe on series 2, image 23 is 5 mm, stable and too small to characterize. Negative gallbladder. No bile duct  enlargement. No periportal edema is evident. Pancreas: Negative. Spleen: Stable splenomegaly, estimated splenic volume 692 mL. (normal splenic volume range 83 - 412 mL). No discrete splenic lesion. Adrenals/Urinary Tract: Negative adrenal glands. Symmetric renal enhancement and contrast excretion. Kidneys appears stable and within normal limits, tiny right renal midpole cysts suspected. Negative ureters. Stable and negative bladder. Stomach/Bowel: Decompressed and negative rectum. Redundant but otherwise negative sigmoid colon. Mild retained stool in the descending colon. Largely decompressed transverse and right colon. Normal appendix on series 2, image 66. No large bowel inflammation. Fluid-filled but normal terminal ileum. No dilated small bowel. Stomach is stable and negative. Mesenteric stranding along the 2nd portion of the duodenum has regressed since last month with mild residual on series 2, image 33. Otherwise negative duodenum. No free air, free fluid, new mesenteric inflammation. Vascular/Lymphatic: Major vascular structures appear patent without atherosclerosis, including patent portal venous system. Prominent portacaval lymph node measuring 16 mm short axis is stable and likely hepatitis related in this setting. No other lymphadenopathy. Reproductive: Negative. Other: No pelvic free fluid. Musculoskeletal: Partially visible healing left lateral rib fractures described last month. Subacute appearing left lumbar transverse process fractures again noted. No new osseous abnormality identified. IMPRESSION: 1. Largely stable CT appearance of the abdomen and pelvis from last month. No new abnormality identified. 2. Splenomegaly with suspected chronic liver disease, and likely hepatitis related mild portacaval lymphadenopathy. 3. Resolving possibly posttraumatic mesenteric inflammation adjacent to the duodenum. Subacute left  rib and lumbar transverse process fractures. Electronically Signed   By: Genevie Ann M.D.    On: 09/06/2020 05:13   CT ABDOMEN PELVIS W CONTRAST  Result Date: 08/21/2020 CLINICAL DATA:  Fall from ladder 3 weeks ago, persistent back pain. EXAM: CT CHEST, ABDOMEN, AND PELVIS WITH CONTRAST TECHNIQUE: Multidetector CT imaging of the chest, abdomen and pelvis was performed following the standard protocol during bolus administration of intravenous contrast. CONTRAST:  173m OMNIPAQUE IOHEXOL 300 MG/ML  SOLN COMPARISON:  Radiographs 08/21/2020 FINDINGS: CT CHEST FINDINGS Cardiovascular: The aortic root is suboptimally assessed given cardiac pulsation artifact. The aorta is normal caliber. No acute luminal abnormality of the imaged aorta. No periaortic stranding or hemorrhage. Normal 3 vessel branching of the aortic arch. Proximal great vessels are unremarkable. Normal heart size. No pericardial effusion. Central pulmonary arteries are normal caliber no large central filling defects with more distal evaluation limited by non tailored examination. No major venous abnormalities are seen. Mediastinum/Nodes: No mediastinal fluid or gas. Normal thyroid gland and thoracic inlet. No acute abnormality of the trachea or esophagus. No worrisome mediastinal, hilar or axillary adenopathy. Lungs/Pleura: No acute traumatic abnormality of the lung parenchyma. Thickening adjacent the contiguous posterior left-sided rib fractures appears to be largely subpleural without layering effusion or pneumothorax. Some minimal adjacent ground-glass could be atelectatic or reflective atelectasis versus resolving pulmonary contusive change. Lungs are otherwise clear. No consolidative process or edema. Musculoskeletal: Subacute appearing left posterior fourth through eighth rib fractures. Adjacent thickening appears largely subpleural, as above. No right-sided rib fractures. No other acute traumatic osseous injuries of the chest wall or imaged thoracic spine. Mild dextrocurvature of the mid to upper thoracic levels. CT ABDOMEN PELVIS  FINDINGS Hepatobiliary: No direct hepatic injury or perihepatic hematoma. Solitary subcentimeter hypoattenuating focus in the right lobe liver (8/16), statistically likely benign. No worrisome focal liver lesions. Mildly lobular hepatic surface contour. Normal hepatic attenuation. Normal gallbladder and biliary tree. No significant biliary ductal dilatation or visible gallstones. Pancreas: No pancreatic ductal dilatation or surrounding inflammatory changes. Spleen: Splenomegaly. No focal splenic lesions. No direct splenic injury or perisplenic hematoma. Adrenals/Urinary Tract: Normal adrenal glands. Kidneys are normally located with symmetric enhancementand excretion. Few subcentimeter hypoattenuating foci are too small to fully characterize. No suspicious renal lesion, urolithiasis or hydronephrosis. Traumatic or acute bladder abnormality is seen. Stomach/Bowel: Distal esophagus, stomach are unremarkable. Minimal stranding adjacent the first and second portion of the duodenum (2/65) without significant wall thickening, extraluminal gas or free fluid. No small bowel wall thickening or dilatation. No evidence of obstruction. A normal appendix is visualized. No colonic dilatation or wall thickening. Vascular/Lymphatic: No significant vascular findings are present. No enlarged abdominal or pelvic lymph nodes. Reproductive: The prostate and seminal vesicles are unremarkable. No acute abnormality of the external genitalia. Other: No abdominopelvic free fluid or free gas. No bowel containing hernias. Musculoskeletal: No acute osseous abnormality or suspicious osseous lesion. Subacute appearing left L1-L3 transverse process fractures. 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 Levocurvature of the lumbar spine, apex L4-5. Multilevel degenerative changes are present in the imaged portions of the spine. Bones of the pelvis are intact and congruent. Proximal femora  are intact and normally located. IMPRESSION: 1. Subacute appearing left posterior fourth through eighth rib fractures. Adjacent thickening appears to be largely subpleural without layering effusion or pneumothorax. Some minimal adjacent ground-glass could be atelectatic related to splinting versus resolving pulmonary contusive change. 2. Subacute appearing left L1-L3 transverse process fractures. 3.  Minimal stranding adjacent the first and second portion of the duodenum without significant wall thickening, extraluminal gas or free fluid. Findings could reflect a mild duodenitis or peptic ulcer disease in the appropriate clinical setting 4. Mildly lobular hepatic surface, suggestive of cirrhosis. Splenomegaly, could be an early manifestation of portal hypertension in the setting of cirrhosis. 5. 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 Anatomic variant. These results were called by telephone at the time of interpretation on 08/21/2020 at 6:10 am to provider [facility] , who verbally acknowledged these results. Electronically Signed   By: Lovena Le M.D.   On: 08/21/2020 06:10   US Abdomen Limited RUQ (LIVER/GB)  Result Date: 08/21/2020 CLINICAL DATA:  Pain of the abdomen in status post fall 3 weeks ago. EXAM: ULTRASOUND ABDOMEN LIMITED RIGHT UPPER QUADRANT COMPARISON:  CT abdomen pelvis August 21, 2020 FINDINGS: Gallbladder: No gallstones or wall thickening visualized. No sonographic Murphy sign noted by sonographer. Common bile duct: Diameter: 4 mm Liver: There is a 0.9 x 0.8 x 1 cm simple cyst in the right lobe liver correlating to CT finding. Within normal limits in parenchymal echogenicity. Portal vein is patent on color Doppler imaging with normal direction of blood flow towards the liver. Other: None. IMPRESSION: No sonographic evidence of acute cholecystitis. Simple cyst right lobe liver. Electronically Signed   By: Abelardo Diesel M.D.   On:  08/21/2020 09:22     Microbiology: Recent Results (from the past 240 hour(s))  SARS CORONAVIRUS 2 (Marzell Isakson 6-24 HRS) Nasopharyngeal Nasopharyngeal Swab     Status: None   Collection Time: 09/06/20  5:07 AM   Specimen: Nasopharyngeal Swab  Result Value Ref Range Status   SARS Coronavirus 2 NEGATIVE NEGATIVE Final    Comment: (NOTE) SARS-CoV-2 target nucleic acids are NOT DETECTED.  The SARS-CoV-2 RNA is generally detectable in upper and lower respiratory specimens during the acute phase of infection. Negative results do not preclude SARS-CoV-2 infection, do not rule out co-infections with other pathogens, and should not be used as the sole basis for treatment or other patient management decisions. Negative results must be combined with clinical observations, patient history, and epidemiological information. The expected result is Negative.  Fact Sheet for Patients: SugarRoll.be  Fact Sheet for Healthcare Providers: https://www.woods-mathews.com/  This test is not yet approved or cleared by the [place] FDA and  has been authorized for detection and/or diagnosis of SARS-CoV-2 by FDA under an Emergency Use Authorization (EUA). This EUA will remain  in effect (meaning this test can be used) for the duration of the COVID-19 declaration under Se ction 564(b)(1) of the Act, 21 U.S.C. section 360bbb-3(b)(1), unless the authorization is terminated or revoked sooner.  Performed at [facility], [place] [address] [place], [place] [address]      Labs: Basic Metabolic Panel: Recent Labs  Lab 09/06/20 0242 09/07/20 0422 09/08/20 0408  NA 135 137 138  K 3.5 4.0 3.6  CL 98 102 102  CO2 _0 GLUCOSE 126* 84 85  BUN _1 CREATININE 0.30* 0.39* 0.43*  CALCIUM 9.7 9.1 9.0   Liver Function Tests: Recent Labs  Lab 09/02/20 0825 09/03/20 1314 09/06/20 0242 09/07/20 0422 09/08/20 0408  AST 445* 335* 204* 162* 153*  ALT  507* 417* 240* 168* 142*  ALKPHOS 221* 233* 234* 172* 169*  BILITOT 35.5* 36.1* 38.0* 28.5* 24.8*  PROT 7.8 8.1 9.1* 7.3 7.1  ALBUMIN 2.8* 3.0*  3.4* 2.8* 2.7*   Recent Labs  Lab 09/06/20 0242  LIPASE 70*   Recent Labs  Lab 09/02/20 0825 09/06/20 0242  AMMONIA 34 21   CBC: Recent Labs  Lab 09/06/20 0243 09/07/20 0422 09/08/20 0408  WBC 10.7* 5.3 4.8  NEUTROABS 8.0*  --   --   HGB 12.8* 10.2* 9.7*  HCT RESULTS UNAVAILABLE DUE TO INTERFERING SUBSTANCE RESULTS UNAVAILABLE DUE TO INTERFERING SUBSTANCE RESULTS UNAVAILABLE DUE TO INTERFERING SUBSTANCE  MCV RESULTS UNAVAILABLE DUE TO INTERFERING SUBSTANCE RESULTS UNAVAILABLE DUE TO INTERFERING SUBSTANCE RESULTS UNAVAILABLE DUE TO INTERFERING SUBSTANCE  PLT 366 326 308   Cardiac Enzymes: No results for input(s): CKTOTAL, CKMB, CKMBINDEX, TROPONINI in the last 168 hours. BNP: Invalid input(s): POCBNP CBG: Recent Labs  Lab 09/06/20 1042 09/07/20 0748 09/08/20 0727  GLUCAP 109* 91 73    Time coordinating discharge:  36 minutes  Signed:  Orson Eva, DO [facility] Pager: [phone] 09/08/2020, 11:19 AM

## 2020-09-08 NOTE — Progress Notes (Signed)
Date and time results received: 09/08/20 6:30 AM  (use smartphrase ".now" to insert current time)  Test: Total Bili Critical Value: 24.8  Name of Provider Notified: hospitalist  Orders Received? Or Actions Taken?: acknowledged

## 2020-09-12 ENCOUNTER — Telehealth: Payer: Self-pay | Admitting: Gastroenterology

## 2020-09-12 ENCOUNTER — Encounter: Payer: Self-pay | Admitting: Gastroenterology

## 2020-09-12 NOTE — Progress Notes (Signed)
Opened in error

## 2020-09-12 NOTE — Telephone Encounter (Signed)
Please let pt know he needs LFTs sometime this week with either [place] or PCP. We can order if more convenient for him.

## 2020-09-13 ENCOUNTER — Other Ambulatory Visit: Payer: Self-pay

## 2020-09-13 DIAGNOSIS — R17 Unspecified jaundice: Secondary | ICD-10-CM

## 2020-09-13 NOTE — Telephone Encounter (Signed)
Spoke with pt. Pt will complete labs this week. Pt states he'll go when he has his apt with our office on Wednesday 09/15/2020.

## 2020-09-15 ENCOUNTER — Other Ambulatory Visit (HOSPITAL_COMMUNITY)
Admission: RE | Admit: 2020-09-15 | Discharge: 2020-09-15 | Disposition: A | Discharge status: Home / Self Care | Payer: Self-pay | Source: Ambulatory Visit | Attending: Gastroenterology | Admitting: Gastroenterology

## 2020-09-15 ENCOUNTER — Other Ambulatory Visit: Payer: Self-pay

## 2020-09-15 DIAGNOSIS — R17 Unspecified jaundice: Secondary | ICD-10-CM | POA: Insufficient documentation

## 2020-09-15 LAB — HEPATIC FUNCTION PANEL
ALT: 80 U/L — ABNORMAL HIGH (ref 0–44)
AST: 103 U/L — ABNORMAL HIGH (ref 15–41)
Albumin: 3.2 g/dL — ABNORMAL LOW (ref 3.5–5.0)
Alkaline Phosphatase: 166 U/L — ABNORMAL HIGH (ref 38–126)
Bilirubin, Direct: 6.5 mg/dL — ABNORMAL HIGH (ref 0.0–0.2)
Indirect Bilirubin: 6.4 mg/dL — ABNORMAL HIGH (ref 0.3–0.9)
Total Bilirubin: 12.9 mg/dL — ABNORMAL HIGH (ref 0.3–1.2)
Total Protein: 7.6 g/dL (ref 6.5–8.1)

## 2020-09-27 ENCOUNTER — Other Ambulatory Visit: Payer: Self-pay

## 2020-09-27 DIAGNOSIS — R7989 Other specified abnormal findings of blood chemistry: Secondary | ICD-10-CM

## 2020-10-07 ENCOUNTER — Other Ambulatory Visit (HOSPITAL_COMMUNITY)
Admission: RE | Admit: 2020-10-07 | Discharge: 2020-10-07 | Disposition: A | Discharge status: Home / Self Care | Payer: Self-pay | Source: Ambulatory Visit | Attending: Gastroenterology | Admitting: Gastroenterology

## 2020-10-07 ENCOUNTER — Other Ambulatory Visit: Payer: Self-pay

## 2020-10-07 DIAGNOSIS — R7989 Other specified abnormal findings of blood chemistry: Secondary | ICD-10-CM | POA: Insufficient documentation

## 2020-10-07 LAB — HEPATIC FUNCTION PANEL
ALT: 22 U/L (ref 0–44)
AST: 26 U/L (ref 15–41)
Albumin: 4 g/dL (ref 3.5–5.0)
Alkaline Phosphatase: 130 U/L — ABNORMAL HIGH (ref 38–126)
Bilirubin, Direct: 1.2 mg/dL — ABNORMAL HIGH (ref 0.0–0.2)
Indirect Bilirubin: 2 mg/dL — ABNORMAL HIGH (ref 0.3–0.9)
Total Bilirubin: 3.2 mg/dL — ABNORMAL HIGH (ref 0.3–1.2)
Total Protein: 7.8 g/dL (ref 6.5–8.1)

## 2020-10-08 ENCOUNTER — Encounter: Payer: Self-pay | Admitting: Gastroenterology

## 2020-10-08 ENCOUNTER — Ambulatory Visit (INDEPENDENT_AMBULATORY_CARE_PROVIDER_SITE_OTHER): Payer: Self-pay | Admitting: Gastroenterology

## 2020-10-08 VITALS — BP 125/83 | HR 66 | Temp 97.7°F | Ht 69.0 in | Wt 224.2 lb

## 2020-10-08 DIAGNOSIS — B169 Acute hepatitis B without delta-agent and without hepatic coma: Secondary | ICD-10-CM

## 2020-10-08 DIAGNOSIS — D539 Nutritional anemia, unspecified: Secondary | ICD-10-CM

## 2020-10-08 NOTE — Progress Notes (Signed)
Primary Care Physician: Etta Grandchild, MD  Primary Gastroenterologist:  Roetta Sessions, MD   Chief Complaint  Patient presents with  . hospital f/u    Doing ok    HPI: Brandon Lane is a 55 y.o. male with history of secondary syphilis, recent inpatient admission in March 2022 with painless jaundice and found to have acute hepatitis B, presenting for follow-up.    Patient with mildly lobular hepatic surface contour, mild splenomegaly on CT imaging in March 2022.  Stable findings noted on April 2022 CT imaging.   Labs from March 2019: Hepatitis B core IgM reactive, hepatitis B surface antigen reactive, hepatitis C antibody nonreactive, hepatitis A IgM nonreactive.  HIV nonreactive.  Hepatitis B E antigen positive, hepatitis B E antibody negative, HBV DNA 5,290,000, HCV RNA not detected, hepatitis B antibody less than 3.1, hepatitis A total antibody nonreactive.  Patient's LFTs reached significantly high levels throughout the course of his acute illness.  AST reached 1094, ALT 809, alkaline phosphatase 267, total bilirubin 38.  Labs from yesterday show total bilirubin of 3.2, alkaline phosphatase 130, AST 26, ALT 22.  Albumin is up to 4.  He required only supportive treatment during his acute illness.  Today: Nausea with certain foods. Has to watch what he eats. Much improved. No vomiting. BM regular. No melena, brbpr. Biggest complaint is persistent pain related to left rib fractures and lumbar vertebral fractures.  He is no longer employed.  He quit his job due to his stress and illness.  He is interested in going back to work at some point but does not feel like his pain will allow him at this time.  Patient denies any alcohol use.  He used to drink heavily in the remote past, in the recent years would consume 10 beers over the course of the week.  Current Outpatient Medications  Medication Sig Dispense Refill  . venlafaxine XR (EFFEXOR-XR) 150 MG 24 hr capsule TAKE 1 CAPSULE BY  MOUTH DAILY WITH BREAKFAST. (Patient taking differently: Take 150 mg by mouth daily with breakfast.) 90 capsule 0   No current facility-administered medications for this visit.    Allergies as of 10/08/2020  . (No Known Allergies)    ROS:  General: Negative for anorexia, weight loss, fever, chills, fatigue, weakness. ENT: Negative for hoarseness, difficulty swallowing , nasal congestion. CV: Negative for chest pain, angina, palpitations, dyspnea on exertion, peripheral edema.  Respiratory: Negative for dyspnea at rest, dyspnea on exertion, cough, sputum, wheezing.  GI: See history of present illness. GU:  Negative for dysuria, hematuria, urinary incontinence, urinary frequency, nocturnal urination.  Endo: Negative for unusual weight change.    Physical Examination:   BP 125/83   Pulse 66   Temp 97.7 F (36.5 C) (Temporal)   Ht 5\' 9"  (1.753 m)   Wt 224 lb 3.2 oz (101.7 kg)   BMI 33.11 kg/m   General: Well-nourished, well-developed in no acute distress.  Eyes: No icterus. Mouth: masked  Abdomen: Bowel sounds are normal, nontender, nondistended, no hepatosplenomegaly or masses, no abdominal bruits or hernia , no rebound or guarding.   Extremities: No lower extremity edema. No clubbing or deformities. Neuro: Alert and oriented x 4   Skin: Warm and dry, no jaundice.   Psych: Alert and cooperative, normal mood and affect.  Labs:  Lab Results  Component Value Date   ALT 22 10/07/2020   AST 26 10/07/2020   ALKPHOS 130 (H) 10/07/2020   BILITOT 3.2 (H)  10/07/2020   Lab Results  Component Value Date   INR 1.0 09/07/2020   INR 1.1 09/06/2020   INR 1.0 09/03/2020   Lab Results  Component Value Date   WBC 4.8 09/08/2020   HGB 9.7 (L) 09/08/2020   HCT RESULTS UNAVAILABLE DUE TO INTERFERING SUBSTANCE 09/08/2020   MCV RESULTS UNAVAILABLE DUE TO INTERFERING SUBSTANCE 09/08/2020   PLT 308 09/08/2020   Lab Results  Component Value Date   CREATININE 0.43 (L) 09/08/2020    BUN 12 09/08/2020   NA 138 09/08/2020   K 3.6 09/08/2020   CL 102 09/08/2020   CO2 27 09/08/2020    No results found for: VITAMINB12 No results found for: FOLATE No results found for: IRON, TIBC, FERRITIN   See HPI as well.  Imaging Studies: No results found.  Assessment/plan:  55 year old male with history of secondary syphilis, acute hepatitis B presenting for follow-up.   Hepatitis B: Clinically he is much improved.  LFTs near normal at this point.  Biggest issues of persistent pain related to left rib fractures and lumbar vertebral fractures.  Previous liver imaging with questionable lobular border and splenomegaly.  May have some underlying cirrhosis and will need to be followed closely.  Given significantly elevated LFTs, body's response to acute hepatitis B, he may be fortunate and clear the virus on his own.  We will need to check labs at 6 months to determine whether or not he will have persistent viremia/chronic hepatitis B.  Questionable underlying cirrhosis: Lobular border on CT with some splenomegaly.  We will continue to follow.  Anemia: Noted during acute illness.  Denies overt GI bleeding.  We will follow-up with upcoming labs.  Check for iron, B12, folate deficiency given previous macrocytosis.  Further recommendations to follow.  1. Encouraged him to avoid sharing needles, nail clippers, razors.  Use barrier protection with sexual intercourse.  Recommend his wife be checked for hepatitis B and complete vaccination if she is not immune. 2. CBC, LFTs, PT/INR, iron/TIBC/ferritin, B12 and folate next month. 3. We will be checking labs in August/September to determine if he has cleared hepatitis B has developed chronic hepatitis B.

## 2020-10-08 NOTE — Patient Instructions (Signed)
1. Go for labs first week of June.  2. We will send you a reminder letter later in August for Hep B labs. At that time we will be able to determine if you have cleared the Hep B or have become a carrier. Until then please do not share needles, nail clippers, razors, and use barrier protection with sexual intercourse.  3. Your wife should be checked for Hep B and consider vaccination if she is not immune.

## 2020-10-13 NOTE — Progress Notes (Signed)
CC'ED TO PCP 

## 2020-11-22 ENCOUNTER — Other Ambulatory Visit (HOSPITAL_COMMUNITY)
Admission: RE | Admit: 2020-11-22 | Discharge: 2020-11-22 | Disposition: A | Discharge status: Home / Self Care | Payer: Self-pay | Source: Ambulatory Visit | Attending: Gastroenterology | Admitting: Gastroenterology

## 2020-11-22 ENCOUNTER — Other Ambulatory Visit: Payer: Self-pay

## 2020-11-22 DIAGNOSIS — B169 Acute hepatitis B without delta-agent and without hepatic coma: Secondary | ICD-10-CM | POA: Insufficient documentation

## 2020-11-22 LAB — CBC WITH DIFFERENTIAL/PLATELET
Abs Immature Granulocytes: 0.02 10*3/uL (ref 0.00–0.07)
Basophils Absolute: 0.1 10*3/uL (ref 0.0–0.1)
Basophils Relative: 1 %
Eosinophils Absolute: 0.3 10*3/uL (ref 0.0–0.5)
Eosinophils Relative: 4 %
HCT: 44.1 % (ref 39.0–52.0)
Hemoglobin: 15.5 g/dL (ref 13.0–17.0)
Immature Granulocytes: 0 %
Lymphocytes Relative: 36 %
Lymphs Abs: 2.5 10*3/uL (ref 0.7–4.0)
MCH: 33.5 pg (ref 26.0–34.0)
MCHC: 35.1 g/dL (ref 30.0–36.0)
MCV: 95.5 fL (ref 80.0–100.0)
Monocytes Absolute: 0.7 10*3/uL (ref 0.1–1.0)
Monocytes Relative: 10 %
Neutro Abs: 3.4 10*3/uL (ref 1.7–7.7)
Neutrophils Relative %: 49 %
Platelets: 206 10*3/uL (ref 150–400)
RBC: 4.62 MIL/uL (ref 4.22–5.81)
RDW: 11.4 % — ABNORMAL LOW (ref 11.5–15.5)
WBC: 6.9 10*3/uL (ref 4.0–10.5)
nRBC: 0 % (ref 0.0–0.2)

## 2020-11-22 LAB — IRON AND TIBC
Iron: 97 ug/dL (ref 45–182)
Saturation Ratios: 25 % (ref 17.9–39.5)
TIBC: 393 ug/dL (ref 250–450)
UIBC: 296 ug/dL

## 2020-11-22 LAB — HEPATIC FUNCTION PANEL
ALT: 21 U/L (ref 0–44)
AST: 24 U/L (ref 15–41)
Albumin: 4.5 g/dL (ref 3.5–5.0)
Alkaline Phosphatase: 123 U/L (ref 38–126)
Bilirubin, Direct: 0.3 mg/dL — ABNORMAL HIGH (ref 0.0–0.2)
Indirect Bilirubin: 1.4 mg/dL — ABNORMAL HIGH (ref 0.3–0.9)
Total Bilirubin: 1.7 mg/dL — ABNORMAL HIGH (ref 0.3–1.2)
Total Protein: 7.8 g/dL (ref 6.5–8.1)

## 2020-11-22 LAB — VITAMIN B12: Vitamin B-12: 369 pg/mL (ref 180–914)

## 2020-11-22 LAB — PROTIME-INR
INR: 0.9 (ref 0.8–1.2)
Prothrombin Time: 12.5 seconds (ref 11.4–15.2)

## 2020-11-22 LAB — FERRITIN: Ferritin: 54 ng/mL (ref 24–336)

## 2020-11-22 LAB — FOLATE: Folate: 21.2 ng/mL (ref >5.9)

## 2020-11-29 ENCOUNTER — Other Ambulatory Visit: Payer: Self-pay | Admitting: *Deleted

## 2020-11-29 DIAGNOSIS — B169 Acute hepatitis B without delta-agent and without hepatic coma: Secondary | ICD-10-CM

## 2021-01-11 ENCOUNTER — Other Ambulatory Visit: Payer: Self-pay

## 2021-01-11 DIAGNOSIS — B169 Acute hepatitis B without delta-agent and without hepatic coma: Secondary | ICD-10-CM

## 2021-02-08 ENCOUNTER — Other Ambulatory Visit (HOSPITAL_COMMUNITY)
Admission: RE | Admit: 2021-02-08 | Discharge: 2021-02-08 | Disposition: A | Discharge status: Home / Self Care | Payer: Self-pay | Source: Ambulatory Visit | Attending: Gastroenterology | Admitting: Gastroenterology

## 2021-02-08 DIAGNOSIS — B169 Acute hepatitis B without delta-agent and without hepatic coma: Secondary | ICD-10-CM | POA: Insufficient documentation

## 2021-02-08 LAB — HEPATITIS B CORE ANTIBODY, TOTAL: Hep B Core Total Ab: REACTIVE — AB

## 2021-02-08 LAB — HEPATITIS B SURFACE ANTIGEN: Hepatitis B Surface Ag: NONREACTIVE

## 2021-02-09 LAB — HEPATITIS B DNA, ULTRAQUANTITATIVE, PCR
HBV DNA SERPL PCR-ACNC: <10 IU/mL
HBV DNA SERPL PCR-LOG IU: UNDETERMINED log10 IU/mL

## 2021-02-09 LAB — HEPATITIS B SURFACE ANTIBODY, QUANTITATIVE: Hep B S AB Quant (Post): 56 m[IU]/mL (ref >9.9)

## 2021-02-09 LAB — HEPATITIS B E ANTIBODY: Hep B E Ab: POSITIVE — AB

## 2021-02-09 LAB — HEPATITIS B E ANTIGEN: Hep B E Ag: NEGATIVE

## 2021-02-21 ENCOUNTER — Other Ambulatory Visit: Payer: Self-pay | Admitting: Gastroenterology

## 2021-02-21 DIAGNOSIS — B169 Acute hepatitis B without delta-agent and without hepatic coma: Secondary | ICD-10-CM

## 2023-03-05 IMAGING — DX DG RIBS W/ CHEST 3+V*L*
5 series · 5 of 5 positions shown · non-contrast
Comparison: None.

CLINICAL DATA: 54-year-old male with fall and left chest wall pain.

EXAM:
LEFT RIBS AND CHEST - 3+ VIEW

[chest pa]
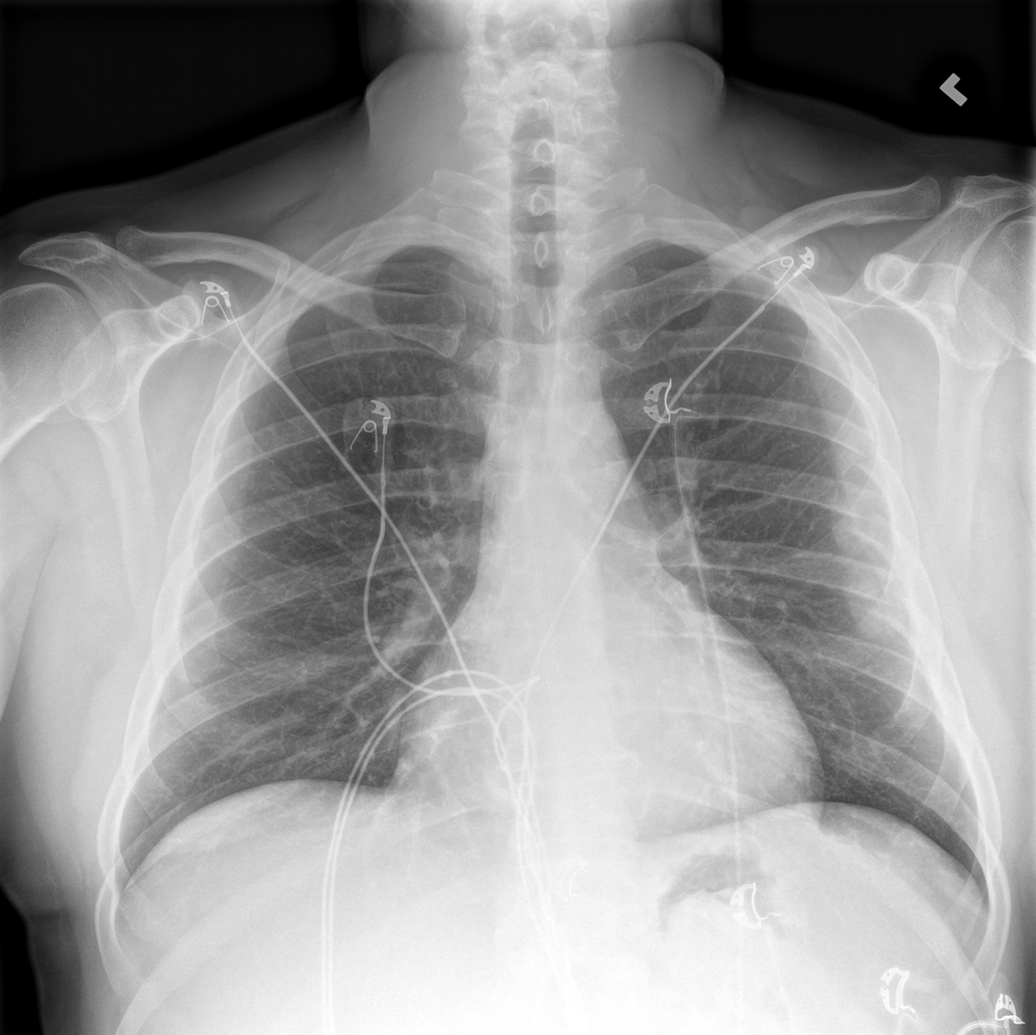

[rib pa obl (1 of 2)]
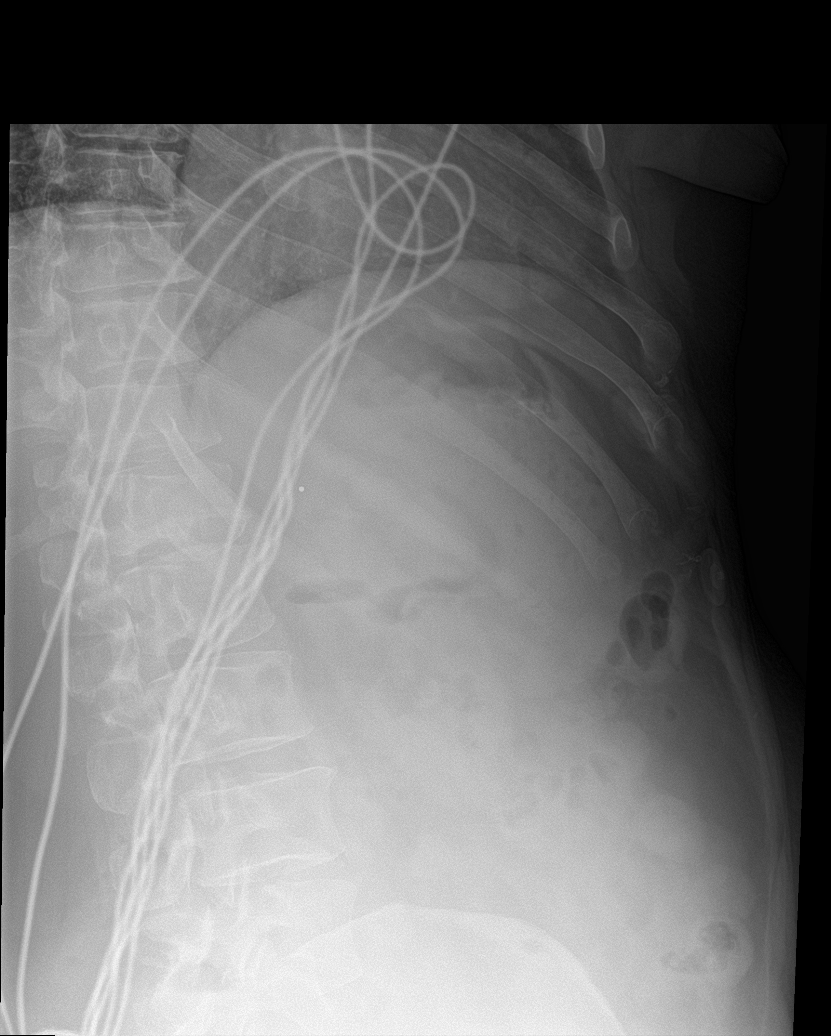

[rib pa obl (2 of 2)]
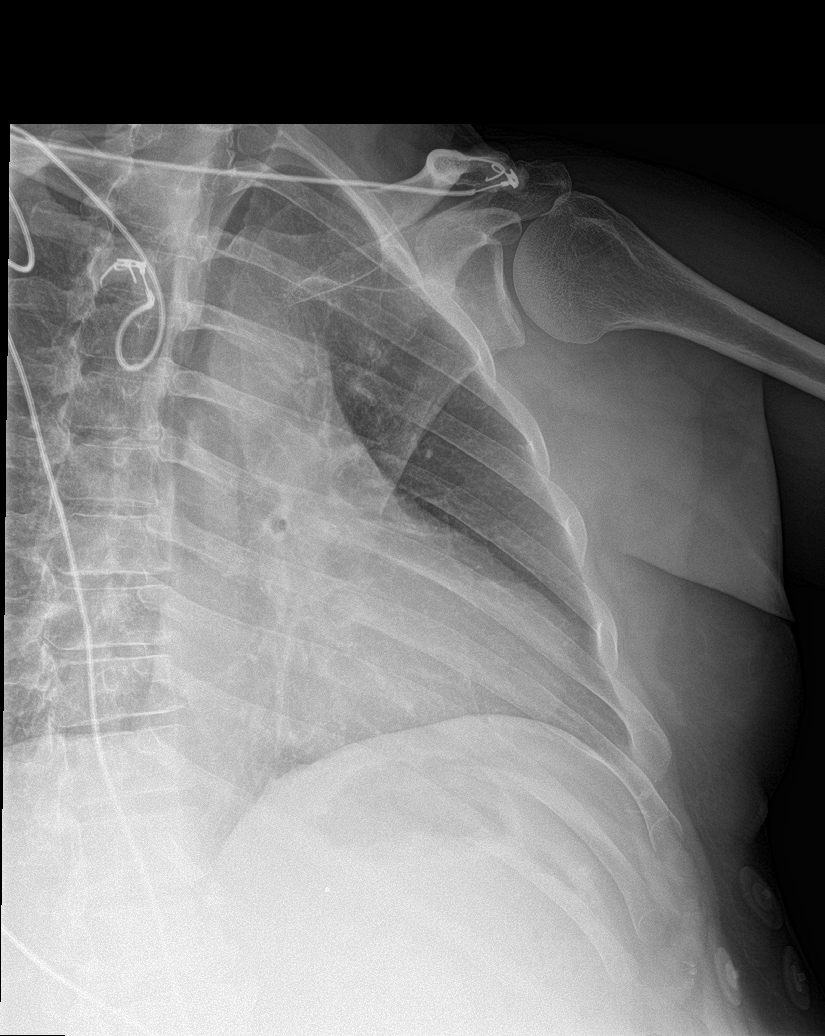

[rib ap (1 of 2)]
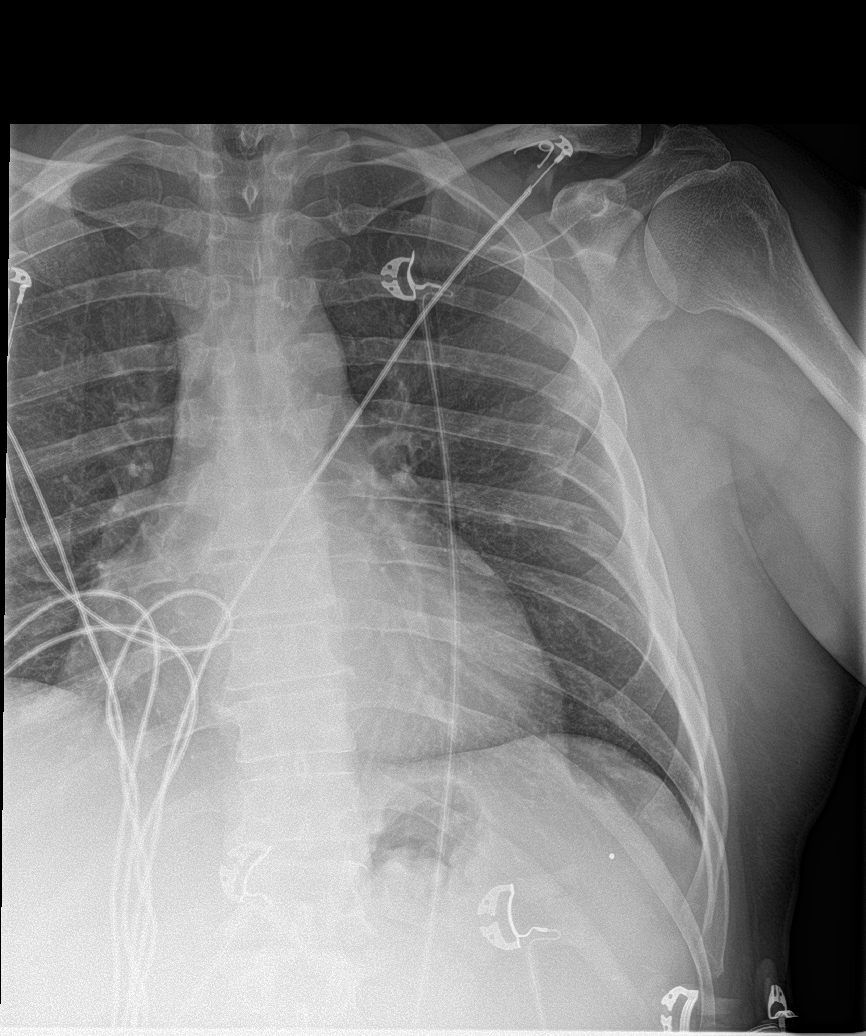

[rib ap (2 of 2)]
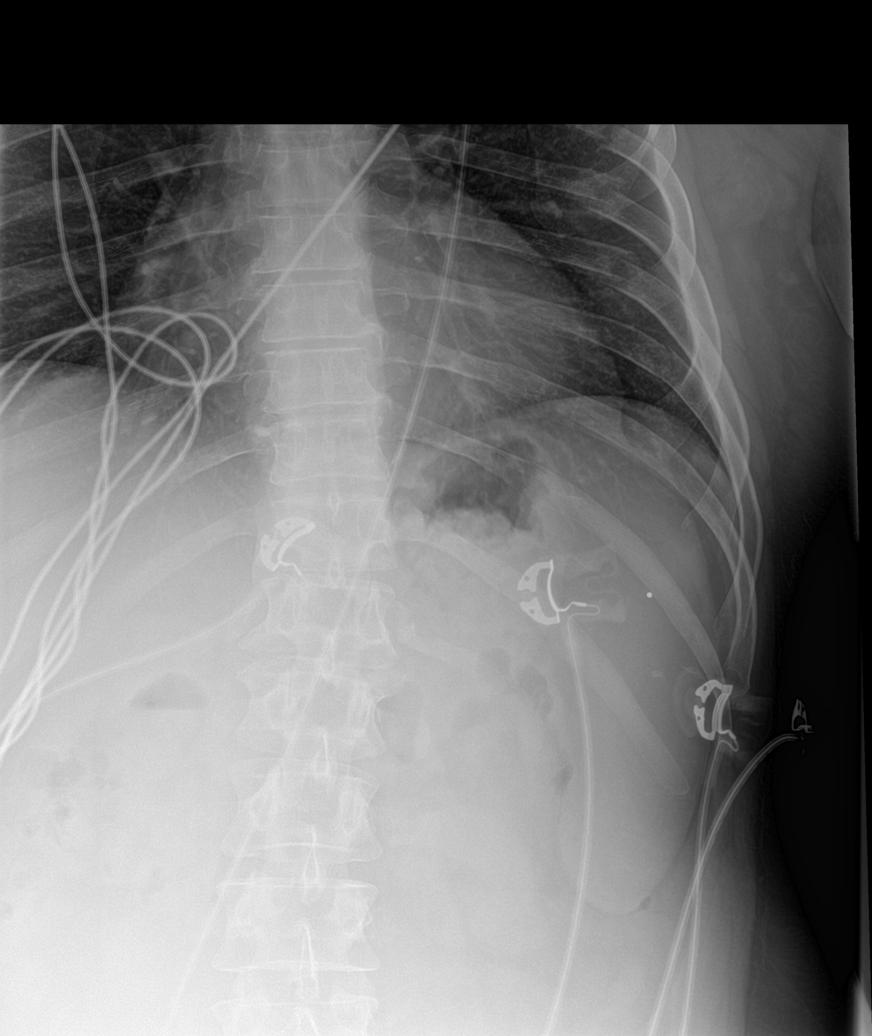

[5 of 5 positions shown; findings below may reference images not displayed]

FINDINGS: There multiple mildly displaced left posterior rib fractures
involving the fourth-eighth ribs. No focal consolidation, pleural
effusion, pneumothorax. The cardiac silhouette is within limits.
IMPRESSION: Multiple mildly displaced left posterior rib fractures. No
pneumothorax.

## 2023-03-05 IMAGING — CT CT CHEST W/ CM
2 of 5 series · 12 of 36 positions shown, 15 images · IV contrast (Omnipaque or Isovue)
Comparison: Radiographs 08/21/2020

CLINICAL DATA: Fall from ladder 3 weeks ago, persistent back pain.

EXAM:
CT CHEST, ABDOMEN, AND PELVIS WITH CONTRAST
TECHNIQUE: Multidetector CT imaging of the chest, abdomen and pelvis was
performed following the standard protocol during bolus
administration of intravenous contrast.
CONTRAST:  100mL OMNIPAQUE IOHEXOL 300 MG/ML  SOLN

[Series 2: cap with · axial · 0.83mm/px · z∈[+786,+1366]mm · 9 of 142 slices shown, 12 images]
[im 13/142  mediastinal]
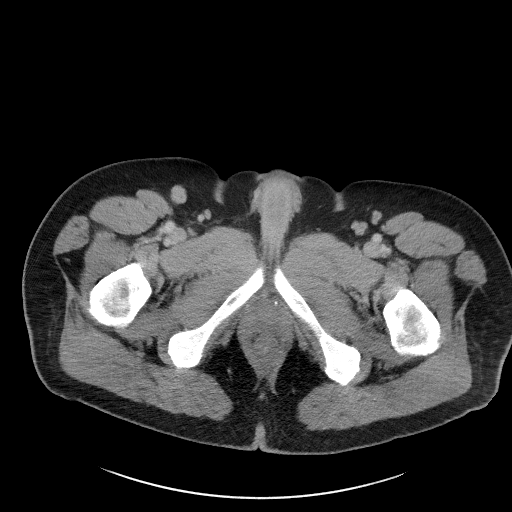
[im 13/142  lung]
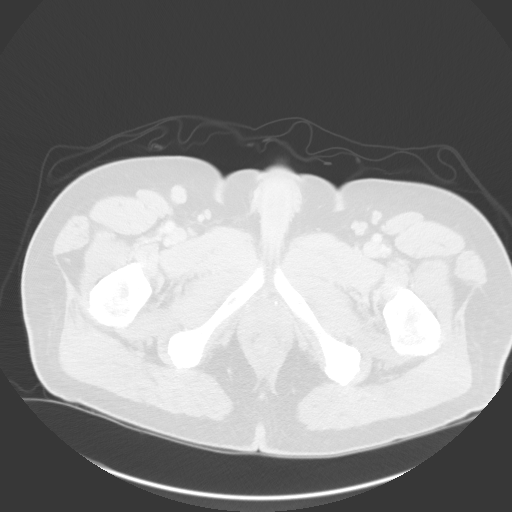
[im 26/142  lung]
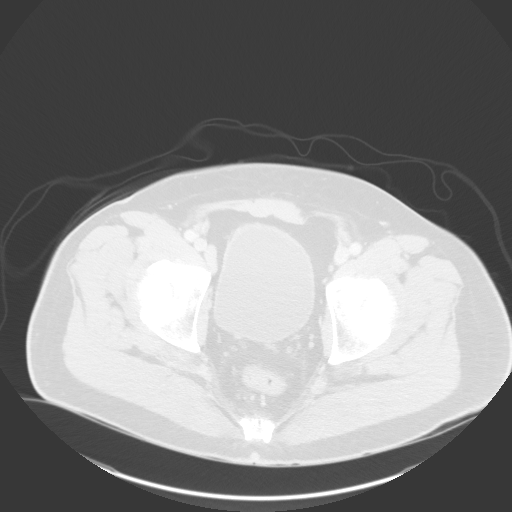
[im 39/142  lung]
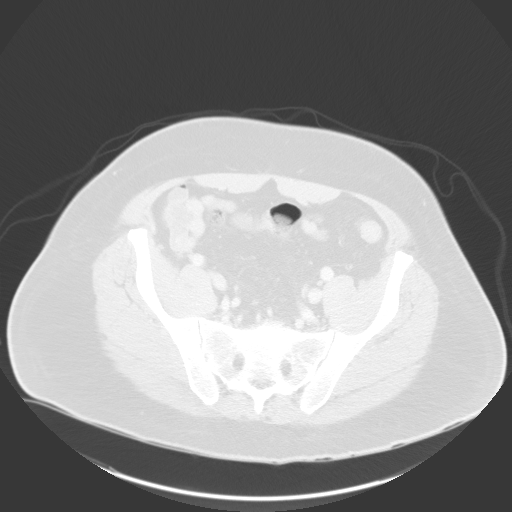
[im 52/142  lung]
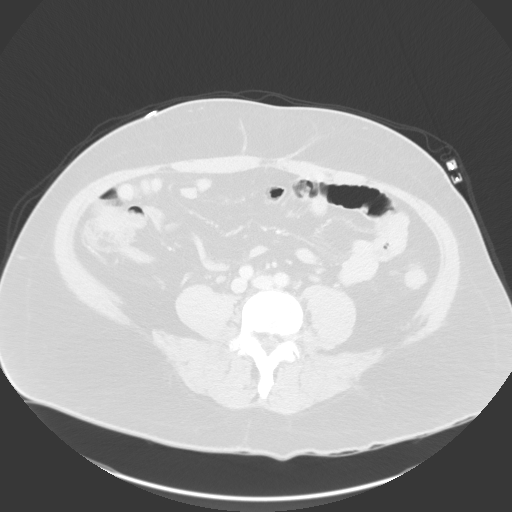
[im 77/142  mediastinal]
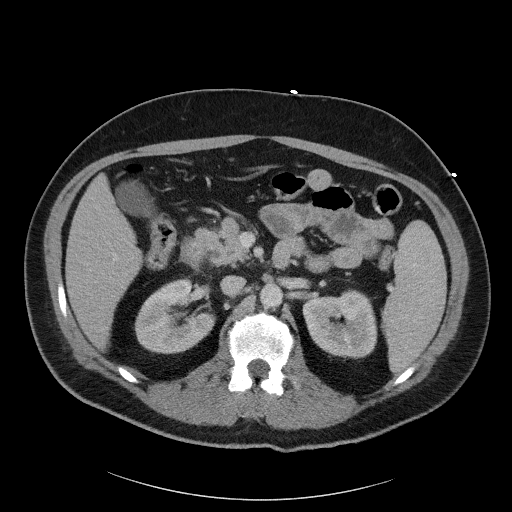
[im 77/142  lung]
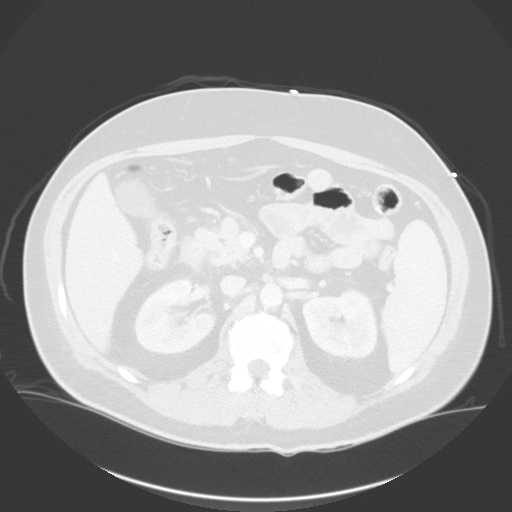
[im 90/142  lung]
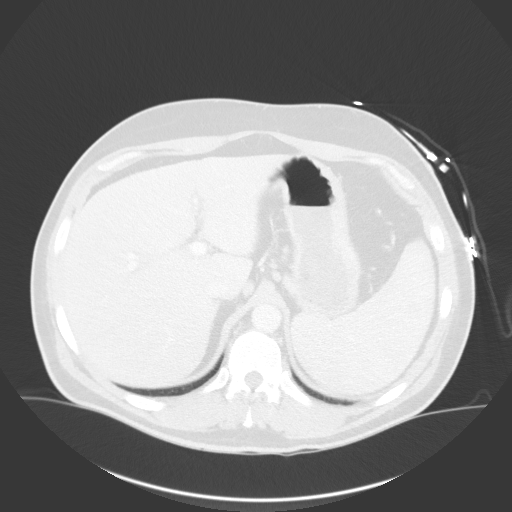
[im 103/142  lung]
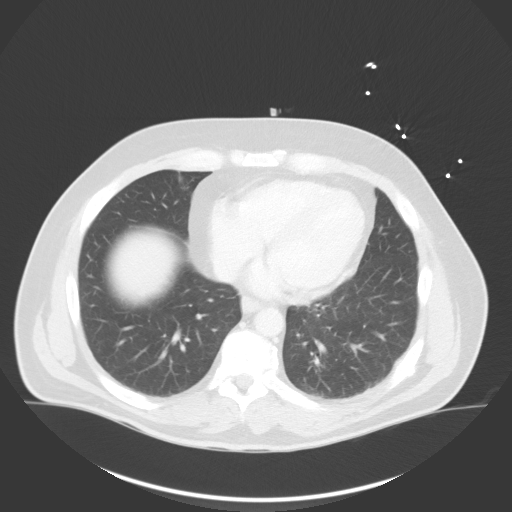
[im 116/142  lung]
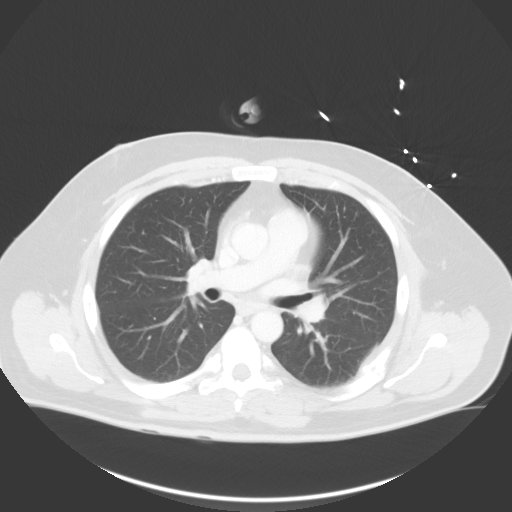
[im 129/142  mediastinal]
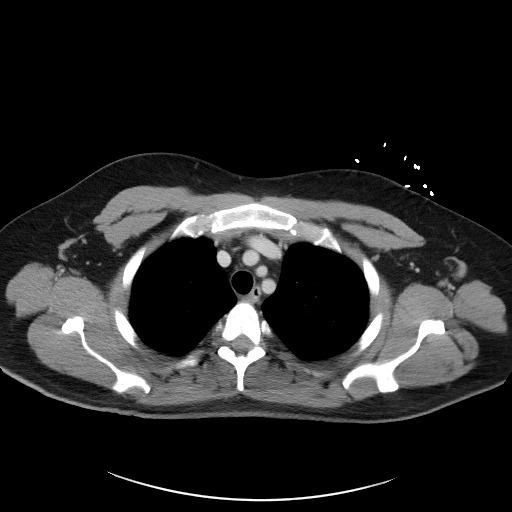
[im 129/142  lung]
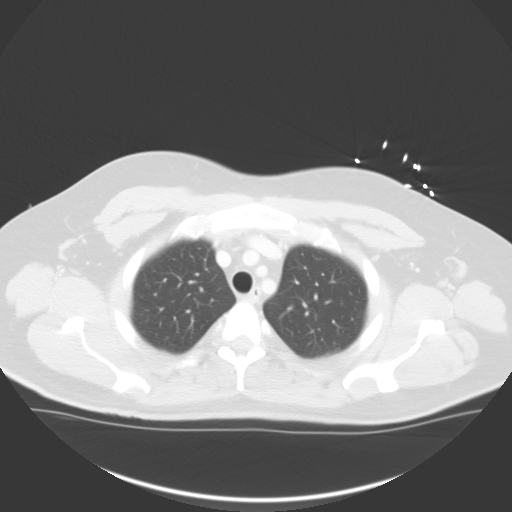

[Series 4: coronals · coronal · 0.96mm/px · 3 of 186 slices shown]
[im 38/186  lung]
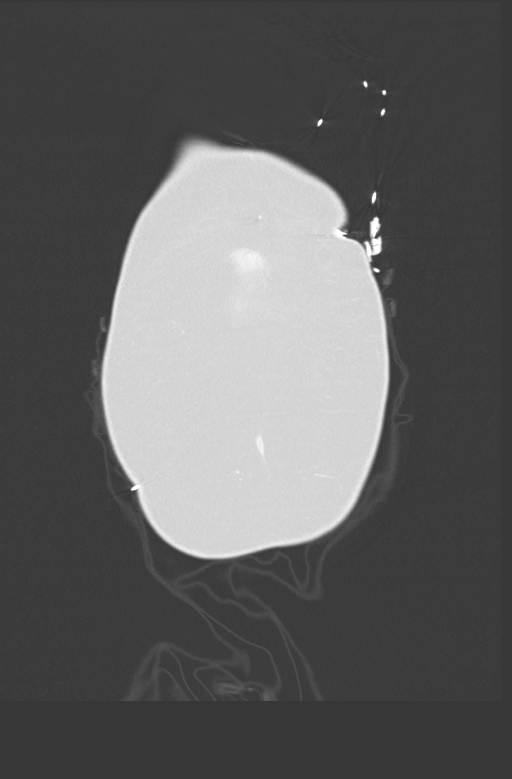
[im 75/186  lung]
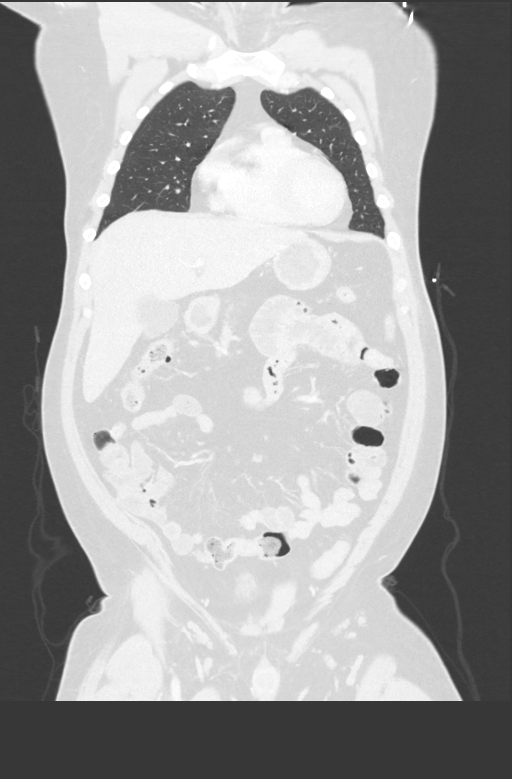
[im 112/186  lung]
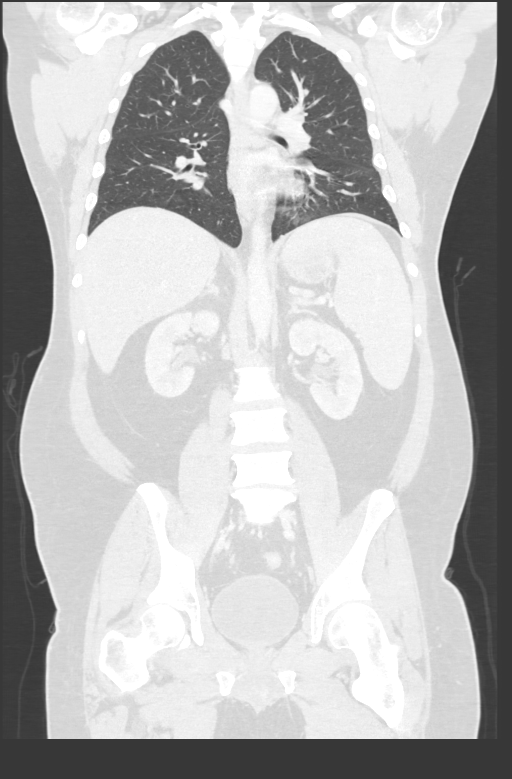

[12 of 36 positions shown; findings below may reference images not displayed]

FINDINGS: CT CHEST FINDINGS

Cardiovascular: The aortic root is suboptimally assessed given
cardiac pulsation artifact. The aorta is normal caliber. No acute
luminal abnormality of the imaged aorta. No periaortic stranding or
hemorrhage. Normal 3 vessel branching of the aortic arch. Proximal
great vessels are unremarkable. Normal heart size. No pericardial
effusion. Central pulmonary arteries are normal caliber no large
central filling defects with more distal evaluation limited by non
tailored examination. No major venous abnormalities are seen.

Mediastinum/Nodes: No mediastinal fluid or gas. Normal thyroid gland
and thoracic inlet. No acute abnormality of the trachea or
esophagus. No worrisome mediastinal, hilar or axillary adenopathy.

Lungs/Pleura: No acute traumatic abnormality of the lung parenchyma.
Thickening adjacent the contiguous posterior left-sided rib
fractures appears to be largely subpleural without layering effusion
or pneumothorax. Some minimal adjacent ground-glass could be
atelectatic or reflective atelectasis versus resolving pulmonary
contusive change. Lungs are otherwise clear. No consolidative
process or edema.

Musculoskeletal: Subacute appearing left posterior fourth through
eighth rib fractures. Adjacent thickening appears largely
subpleural, as above. No right-sided rib fractures. No other acute
traumatic osseous injuries of the chest wall or imaged thoracic
spine. Mild dextrocurvature of the mid to upper thoracic levels.

CT ABDOMEN PELVIS FINDINGS

Hepatobiliary: No direct hepatic injury or perihepatic hematoma.
Solitary subcentimeter hypoattenuating focus in the right lobe liver
([DATE]), statistically likely benign. No worrisome focal liver
lesions. Mildly lobular hepatic surface contour. Normal hepatic
attenuation. Normal gallbladder and biliary tree. No significant
biliary ductal dilatation or visible gallstones.

Pancreas: No pancreatic ductal dilatation or surrounding
inflammatory changes.

Spleen: Splenomegaly. No focal splenic lesions. No direct splenic
injury or perisplenic hematoma.

Adrenals/Urinary Tract: Normal adrenal glands. Kidneys are normally
located with symmetric enhancementand excretion. Few subcentimeter
hypoattenuating foci are too small to fully characterize. No
suspicious renal lesion, urolithiasis or hydronephrosis. Traumatic
or acute bladder abnormality is seen.

Stomach/Bowel: Distal esophagus, stomach are unremarkable. Minimal
stranding adjacent the first and second portion of the duodenum
(2/65) without significant wall thickening, extraluminal gas or free
fluid. No small bowel wall thickening or dilatation. No evidence of
obstruction. A normal appendix is visualized. No colonic dilatation
or wall thickening.

Vascular/Lymphatic: No significant vascular findings are present. No
enlarged abdominal or pelvic lymph nodes.

Reproductive: The prostate and seminal vesicles are unremarkable. No
acute abnormality of the external genitalia.

Other: No abdominopelvic free fluid or free gas. No bowel containing
hernias.

Musculoskeletal: No acute osseous abnormality or suspicious osseous
lesion. Subacute appearing left L1-L3 transverse process fractures.
Sacralization of the left L5 transverse process with
pseudoarticulation with the adjacent sacral ala. Levocurvature of
the lumbar spine, apex L4-5. Multilevel degenerative changes are
present in the imaged portions of the spine. Bones of the pelvis are
intact and congruent. Proximal femora are intact and normally
located.
IMPRESSION: 1. Subacute appearing left posterior fourth through eighth rib
fractures. Adjacent thickening appears to be largely subpleural
without layering effusion or pneumothorax. Some minimal adjacent
ground-glass could be atelectatic related to splinting versus
resolving pulmonary contusive change.
2. Subacute appearing left L1-L3 transverse process fractures.
3. Minimal stranding adjacent the first and second portion of the
duodenum without significant wall thickening, extraluminal gas or
free fluid. Findings could reflect a mild duodenitis or peptic ulcer
disease in the appropriate clinical setting
4. Mildly lobular hepatic surface, suggestive of cirrhosis.
Splenomegaly, could be an early manifestation of portal hypertension
in the setting of cirrhosis.
5. Sacralization of the left L5 transverse process with
pseudoarticulation with the adjacent sacral ala. Anatomic variant.

These results were called by telephone at the time of interpretation
on 08/21/2020 at [DATE] to provider KARL THAKER , who verbally
acknowledged these results.

## 2024-06-17 ENCOUNTER — Ambulatory Visit (INDEPENDENT_AMBULATORY_CARE_PROVIDER_SITE_OTHER): Admitting: Family Medicine

## 2024-06-17 ENCOUNTER — Encounter: Payer: Self-pay | Admitting: Family Medicine

## 2024-06-17 VITALS — BP 136/86 | HR 65 | Temp 98.7°F | Ht 69.0 in | Wt 247.8 lb

## 2024-06-17 DIAGNOSIS — L989 Disorder of the skin and subcutaneous tissue, unspecified: Secondary | ICD-10-CM | POA: Diagnosis not present

## 2024-06-17 DIAGNOSIS — G4733 Obstructive sleep apnea (adult) (pediatric): Secondary | ICD-10-CM

## 2024-06-17 DIAGNOSIS — K219 Gastro-esophageal reflux disease without esophagitis: Secondary | ICD-10-CM

## 2024-06-17 DIAGNOSIS — F331 Major depressive disorder, recurrent, moderate: Secondary | ICD-10-CM | POA: Diagnosis not present

## 2024-06-17 DIAGNOSIS — E559 Vitamin D deficiency, unspecified: Secondary | ICD-10-CM | POA: Diagnosis not present

## 2024-06-17 DIAGNOSIS — I10 Essential (primary) hypertension: Secondary | ICD-10-CM | POA: Diagnosis not present

## 2024-06-17 DIAGNOSIS — B181 Chronic viral hepatitis B without delta-agent: Secondary | ICD-10-CM | POA: Diagnosis not present

## 2024-06-17 DIAGNOSIS — F411 Generalized anxiety disorder: Secondary | ICD-10-CM

## 2024-06-17 DIAGNOSIS — F5221 Male erectile disorder: Secondary | ICD-10-CM

## 2024-06-17 LAB — CBC WITH DIFFERENTIAL/PLATELET
Basophils Absolute: 0 K/uL (ref 0.0–0.1)
Basophils Relative: 0.7 % (ref 0.0–3.0)
Eosinophils Absolute: 0.1 K/uL (ref 0.0–0.7)
Eosinophils Relative: 2 % (ref 0.0–5.0)
HCT: 43.8 % (ref 39.0–52.0)
Hemoglobin: 15.2 g/dL (ref 13.0–17.0)
Lymphocytes Relative: 31.3 % (ref 12.0–46.0)
Lymphs Abs: 2.1 K/uL (ref 0.7–4.0)
MCHC: 34.8 g/dL (ref 30.0–36.0)
MCV: 95.3 fl (ref 78.0–100.0)
Monocytes Absolute: 0.5 K/uL (ref 0.1–1.0)
Monocytes Relative: 8.4 % (ref 3.0–12.0)
Neutro Abs: 3.8 K/uL (ref 1.4–7.7)
Neutrophils Relative %: 57.6 % (ref 43.0–77.0)
Platelets: 189 K/uL (ref 150.0–400.0)
RBC: 4.59 Mil/uL (ref 4.22–5.81)
RDW: 13.4 % (ref 11.5–15.5)
WBC: 6.5 K/uL (ref 4.0–10.5)

## 2024-06-17 LAB — COMPREHENSIVE METABOLIC PANEL WITH GFR
ALT: 17 U/L (ref 3–53)
AST: 18 U/L (ref 5–37)
Albumin: 4.8 g/dL (ref 3.5–5.2)
Alkaline Phosphatase: 110 U/L (ref 39–117)
BUN: 9 mg/dL (ref 6–23)
CO2: 28 meq/L (ref 19–32)
Calcium: 9.1 mg/dL (ref 8.4–10.5)
Chloride: 103 meq/L (ref 96–112)
Creatinine, Ser: 1 mg/dL (ref 0.40–1.50)
GFR: 83.56 mL/min
Glucose, Bld: 98 mg/dL (ref 70–99)
Potassium: 4.2 meq/L (ref 3.5–5.1)
Sodium: 139 meq/L (ref 135–145)
Total Bilirubin: 1.5 mg/dL — ABNORMAL HIGH (ref 0.2–1.2)
Total Protein: 7.3 g/dL (ref 6.0–8.3)

## 2024-06-17 LAB — TSH: TSH: 2.02 u[IU]/mL (ref 0.35–5.50)

## 2024-06-17 LAB — VITAMIN D 25 HYDROXY (VIT D DEFICIENCY, FRACTURES): VITD: 16.15 ng/mL — ABNORMAL LOW (ref 30.00–100.00)

## 2024-06-17 LAB — VITAMIN B12: Vitamin B-12: 418 pg/mL (ref 211–911)

## 2024-06-17 MED ORDER — VENLAFAXINE HCL ER 75 MG PO CP24: 75.0000 mg | ORAL_CAPSULE | Freq: Two times a day (BID) | ORAL | 1 refills | Status: AC

## 2024-06-17 NOTE — Progress Notes (Signed)
 "  New Patient Visit  Subjective:     Patient ID: Brandon Lane, male    DOB: 07-29-1965, 59 y.o.   MRN: [id number]  Chief Complaint  Patient presents with   Establish Care    HPI  Discussed the use of AI scribe software for clinical note transcription with the patient, who gave verbal consent to proceed.  History of Present Illness Brandon Lane is a 59 year old male who presents for medication management and evaluation of scalp lesions. He is presenting to establish care with me today.  Scalp lesions - Longstanding scalp lesions with recurrence after prior removal. - Lesions are painful when traumatized and occasionally bleed. - One lesion was previously removed using a charcoal substance, but recurred. - Unable to contact previous provider who performed removal.  Mood disturbance and medication management - Currently taking venlafaxine , previously 75 mg once daily; considering increasing to twice daily. - Venlafaxine  was effective when taken consistently, but he ran out of medication. - Discontinued prior bipolar medication due to increased appetite and weight gain. 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 - Easily agitated and seeks assistance managing outbursts. - Desires to feel calm and energetic, but currently experiences low energy and overeating.  History of Hepatitis b infection - Diagnosed with hepatitis B during prior hospitalization. - Desires medication to reduce transmission risk. - Has not had recent laboratory evaluation; prior results were favorable. - Has not followed up with infectious disease specialist since initial diagnosis. - Willing to undergo blood work to reassess current status.  History of syphilis - Previously treated for syphilis with injections.  Blood pressure management - History of elevated blood pressure, previously treated with medication until  normalization. - Does not monitor blood pressure at home. - No recent episodes of significantly elevated blood pressure.     ROS Per HPI  Outpatient Encounter Medications as of 06/17/2024  Medication Sig   venlafaxine  XR (EFFEXOR -XR) 75 MG 24 hr capsule Take 1 capsule (75 mg total) by mouth in the morning and at bedtime.   [DISCONTINUED] venlafaxine  XR (EFFEXOR -XR) 150 MG 24 hr capsule TAKE 1 CAPSULE BY MOUTH DAILY WITH BREAKFAST. (Patient not taking: Reported on 06/17/2024)   No facility-administered encounter medications on file as of 06/17/2024.    Past Medical History:  Diagnosis Date   DDD (degenerative disc disease), lumbar    Depression    GERD (gastroesophageal reflux disease)    Hepatitis B    Syphilis     History reviewed. No pertinent surgical history.  Family History  Problem Relation Age of Onset   Arthritis Mother    Hyperlipidemia Mother    Stroke Mother    Hypertension Mother    Kidney disease Mother    Diabetes Mother    Alcohol abuse Father    Hypertension Sister    Cancer Neg Hx    Early death Neg Hx    Heart disease Brother    Hypertension Brother    Stroke Brother     Social History   Socioeconomic History   Marital status: Married    Spouse name: Not on file   Number of children: 5   Years of education: 10   Highest education level: Not on file  Occupational History    Employer: [employer]  Tobacco Use   Smoking status: Former    Current packs/day: 0.00    Average packs/day: 1 pack/day for 20.0 years (20.0  ttl pk-yrs)    Types: Cigarettes    Start date: 09/21/1978    Quit date: 09/21/1998    Years since quitting: 25.7   Smokeless tobacco: Never  Substance and Sexual Activity   Alcohol use: Not Currently    Alcohol/week: 10.0 standard drinks of alcohol    Types: 10 Cans of beer per week    Comment: 10/08/20; denied    Drug use: No   Sexual activity: Yes  Other Topics Concern   Not on file  Social History Narrative   Caffienated  beverages- Yes   Seat belts use-Yes   Smoke Alarm in home-yes   Guns / fireman- No   Physical abuse- No   Social Drivers of Health   Tobacco Use: Medium Risk (06/17/2024)   Patient History    Smoking Tobacco Use: Former    Smokeless Tobacco Use: Never    Passive Exposure: Not on Actuary Strain: Not on file  Food Insecurity: Not on file  Transportation Needs: Not on file  Physical Activity: Not on file  Stress: Not on file  Social Connections: Not on file  Intimate Partner Violence: Not on file  Depression (PHQ2-9): High Risk (06/17/2024)   Depression (PHQ2-9)    PHQ-2 Score: 16  Alcohol Screen: Not on file  Housing: Not on file  Utilities: Not on file  Health Literacy: Not on file       Objective:    BP 136/86   Pulse 65   Temp 98.7 F (37.1 C) (Temporal)   Ht 5' 9 (1.753 m)   Wt 247 lb 12.8 oz (112.4 kg)   SpO2 99%   BMI 36.59 kg/m    Physical Exam Vitals and nursing note reviewed.  Constitutional:      General: He is not in acute distress. HENT:     Head: Normocephalic and atraumatic.     Right Ear: External ear normal.     Left Ear: External ear normal.     Nose: Nose normal.     Mouth/Throat:     Mouth: Mucous membranes are moist.     Pharynx: Oropharynx is clear.  Eyes:     Extraocular Movements: Extraocular movements intact.  Cardiovascular:     Rate and Rhythm: Normal rate and regular rhythm.     Pulses: Normal pulses.     Heart sounds: Normal heart sounds.  Pulmonary:     Effort: Pulmonary effort is normal. No respiratory distress.     Breath sounds: Normal breath sounds. No wheezing, rhonchi or rales.  Musculoskeletal:        General: Normal range of motion.     Cervical back: Normal range of motion.     Right lower leg: No edema.     Left lower leg: No edema.  Lymphadenopathy:     Cervical: No cervical adenopathy.  Skin:    General: Skin is warm and dry.     Comments: See photos of scalp  Neurological:     General:  No focal deficit present.     Mental Status: He is alert and oriented to person, place, and time.  Psychiatric:        Mood and Affect: Mood normal.        Behavior: Behavior normal.     Results for orders placed or performed in visit on 06/17/24  CBC with Differential/Platelet  Result Value Ref Range   WBC 6.5 4.0 - 10.5 K/uL   RBC 4.59 4.22 - 5.81 Mil/uL  Hemoglobin 15.2 13.0 - 17.0 g/dL   HCT 56.1 60.9 - 47.9 %   MCV 95.3 78.0 - 100.0 fl   MCHC 34.8 30.0 - 36.0 g/dL   RDW 86.5 88.4 - 84.4 %   Platelets 189.0 150.0 - 400.0 K/uL   Neutrophils Relative % 57.6 43.0 - 77.0 %   Lymphocytes Relative 31.3 12.0 - 46.0 %   Monocytes Relative 8.4 3.0 - 12.0 %   Eosinophils Relative 2.0 0.0 - 5.0 %   Basophils Relative 0.7 0.0 - 3.0 %   Neutro Abs 3.8 1.4 - 7.7 K/uL   Lymphs Abs 2.1 0.7 - 4.0 K/uL   Monocytes Absolute 0.5 0.1 - 1.0 K/uL   Eosinophils Absolute 0.1 0.0 - 0.7 K/uL   Basophils Absolute 0.0 0.0 - 0.1 K/uL  Comprehensive metabolic panel with GFR  Result Value Ref Range   Sodium 139 135 - 145 mEq/L   Potassium 4.2 3.5 - 5.1 mEq/L   Chloride 103 96 - 112 mEq/L   CO2 28 19 - 32 mEq/L   Glucose, Bld 98 70 - 99 mg/dL   BUN 9 6 - 23 mg/dL   Creatinine, Ser 8.99 0.40 - 1.50 mg/dL   Total Bilirubin 1.5 (H) 0.2 - 1.2 mg/dL   Alkaline Phosphatase 110 39 - 117 U/L   AST 18 5 - 37 U/L   ALT 17 3 - 53 U/L   Total Protein 7.3 6.0 - 8.3 g/dL   Albumin 4.8 3.5 - 5.2 g/dL   GFR 16.43 >39.99 mL/min   Calcium 9.1 8.4 - 10.5 mg/dL  TSH  Result Value Ref Range   TSH 2.02 0.35 - 5.50 uIU/mL  Vitamin B12  Result Value Ref Range   Vitamin B-12 418 211 - 911 pg/mL  VITAMIN D  25 Hydroxy (Vit-D Deficiency, Fractures)  Result Value Ref Range   VITD 16.15 (L) 30.00 - 100.00 ng/mL  Hepatitis B surface antibody,quantitative  Result Value Ref Range   Hep B S AB Quant (Post) 102 > OR = 10 mIU/mL        Assessment & Plan:   Assessment and Plan Assessment & Plan Chronic hepatitis  B Well-managed with normal liver function tests. - Ordered hepatitis B antibody levels. - Referred to GI for ongoing management and monitoring of liver function.  Skin lesions Multiple recurrent painful lesions with occasional bleeding. Previous effective treatment was out-of-network. - Referred to dermatology for evaluation and management.  Major depressive disorder, recurrent, GAD Recurrent with mood fluctuations and anger outbursts. Previous higher dose venlafaxine  was effective. Current dose 75 mg, considering increase to twice daily. Discussed genetic testing if symptoms persist after 4-6 weeks. - Prescribed venlafaxine  75 mg twice daily. - Scheduled follow-up in 4-6 weeks to assess response and consider genetic testing.  Essential hypertension Well-controlled. Blood pressure stable at 130/80 mmHg.  Vitamin D  Deficiency - vitamin D  levels today  Hyperbilirubinemia - mildly elevated at 1.5 but is stable - no jaundice - no evidence of hepatitis      Orders Placed This Encounter  Procedures   CBC with Differential/Platelet    Release to patient:   Immediate [1]   Comprehensive metabolic panel with GFR    Release to patient:   Immediate [1]   TSH   Vitamin B12   VITAMIN D  25 Hydroxy (Vit-D Deficiency, Fractures)   Hepatitis B surface antibody,quantitative   Ambulatory referral to Dermatology    Referral Priority:   Routine    Referral Type:   Consultation  Referral Reason:   Specialty Services Required    Requested Specialty:   Dermatology    Number of Visits Requested:   1   Ambulatory referral to Gastroenterology    Referral Priority:   Routine    Referral Type:   Consultation    Referral Reason:   Specialty Services Required    Number of Visits Requested:   1     Meds ordered this encounter  Medications   venlafaxine  XR (EFFEXOR -XR) 75 MG 24 hr capsule    Sig: Take 1 capsule (75 mg total) by mouth in the morning and at bedtime.    Dispense:  60 capsule     Refill:  1    Return in about 6 weeks (around 07/29/2024) for meds eval.  Brandon LITTIE Ku, FNP   "

## 2024-06-17 NOTE — Patient Instructions (Addendum)
 Welcome to [facility]!  Thank you for choosing us  for your Primary Care needs.   We offer in person and video appointments for your convenience. You may call our office to schedule appointments, or you may schedule appointments with me through MyChart.   The best way to get in contact with me is via MyChart message. This will get to me faster than a phone call, unless there is an emergency, then please call 911.  The lab is located downstairs in the Sports Medicine building, we also have xray available there.   We are checking labs today, will be in contact with any results that require further attention.  Referred to dermatology today. Someone will be reaching out to get you scheduled.   Effexor  XR 75mg  twice a day.   Will consider genesight testing if effexor  is not as effective as he'd like it to be.   Follow up with me in about 6 weeks for medication evaluation.

## 2024-06-18 LAB — HEPATITIS B SURFACE ANTIBODY, QUANTITATIVE: Hep B S AB Quant (Post): 102 m[IU]/mL

## 2024-06-19 ENCOUNTER — Ambulatory Visit: Payer: Self-pay | Admitting: Family Medicine

## 2024-06-26 ENCOUNTER — Encounter: Payer: Self-pay | Admitting: Internal Medicine

## 2024-07-23 ENCOUNTER — Ambulatory Visit: Admitting: Internal Medicine

## 2024-07-29 ENCOUNTER — Ambulatory Visit: Admitting: Family Medicine

## 2025-03-02 ENCOUNTER — Ambulatory Visit: Admitting: Physician Assistant
